# Patient Record
Sex: Female | Born: 1950
Health system: Southern US, Community
[De-identification: ages and names within clinical notes are randomized; demographics above are authoritative.]

## PROBLEM LIST (undated history)

## (undated) DIAGNOSIS — K5792 Diverticulitis of intestine, part unspecified, without perforation or abscess without bleeding: Secondary | ICD-10-CM

## (undated) DIAGNOSIS — E039 Hypothyroidism, unspecified: Secondary | ICD-10-CM

## (undated) DIAGNOSIS — F411 Generalized anxiety disorder: Secondary | ICD-10-CM

## (undated) DIAGNOSIS — I10 Essential (primary) hypertension: Secondary | ICD-10-CM

## (undated) DIAGNOSIS — M1990 Unspecified osteoarthritis, unspecified site: Secondary | ICD-10-CM

## (undated) DIAGNOSIS — Z9889 Other specified postprocedural states: Secondary | ICD-10-CM

## (undated) DIAGNOSIS — D509 Iron deficiency anemia, unspecified: Secondary | ICD-10-CM

## (undated) DIAGNOSIS — K5730 Diverticulosis of large intestine without perforation or abscess without bleeding: Secondary | ICD-10-CM

## (undated) DIAGNOSIS — E782 Mixed hyperlipidemia: Secondary | ICD-10-CM

## (undated) DIAGNOSIS — R091 Pleurisy: Secondary | ICD-10-CM

## (undated) DIAGNOSIS — K449 Diaphragmatic hernia without obstruction or gangrene: Secondary | ICD-10-CM

## (undated) DIAGNOSIS — R112 Nausea with vomiting, unspecified: Secondary | ICD-10-CM

## (undated) DIAGNOSIS — K219 Gastro-esophageal reflux disease without esophagitis: Secondary | ICD-10-CM

## (undated) HISTORY — DX: Gastro-esophageal reflux disease without esophagitis: K21.9

## (undated) HISTORY — DX: Diaphragmatic hernia without obstruction or gangrene: K44.9

## (undated) HISTORY — DX: Diverticulosis of large intestine without perforation or abscess without bleeding: K57.30

## (undated) HISTORY — PX: ABDOMINAL HYSTERECTOMY: SHX81

## (undated) HISTORY — DX: Unspecified osteoarthritis, unspecified site: M19.90

## (undated) HISTORY — DX: Generalized anxiety disorder: F41.1

## (undated) HISTORY — DX: Iron deficiency anemia, unspecified: D50.9

## (undated) HISTORY — DX: Mixed hyperlipidemia: E78.2

## (undated) HISTORY — PX: COLONOSCOPY: SHX174

## (undated) HISTORY — DX: Diverticulitis of intestine, part unspecified, without perforation or abscess without bleeding: K57.92

## (undated) HISTORY — DX: Hypothyroidism, unspecified: E03.9

## (undated) HISTORY — PX: TONSILLECTOMY: SUR1361

## (undated) HISTORY — DX: Essential (primary) hypertension: I10

## (undated) HISTORY — PX: APPENDECTOMY: SHX54

---

## 1986-12-07 HISTORY — PX: OTHER SURGICAL HISTORY: SHX169

## 1996-12-07 HISTORY — PX: ABDOMINAL HYSTERECTOMY: SHX81

## 2007-09-12 ENCOUNTER — Ambulatory Visit (HOSPITAL_COMMUNITY): Admission: RE | Admit: 2007-09-12 | Discharge: 2007-09-12 | Payer: Self-pay | Admitting: Pediatrics

## 2008-09-13 ENCOUNTER — Ambulatory Visit (HOSPITAL_COMMUNITY): Admission: RE | Admit: 2008-09-13 | Discharge: 2008-09-13 | Payer: Self-pay | Admitting: Pediatrics

## 2009-04-29 ENCOUNTER — Encounter (INDEPENDENT_AMBULATORY_CARE_PROVIDER_SITE_OTHER): Payer: Self-pay | Admitting: *Deleted

## 2009-05-28 ENCOUNTER — Ambulatory Visit (HOSPITAL_COMMUNITY): Admission: RE | Admit: 2009-05-28 | Discharge: 2009-05-28 | Payer: Self-pay | Admitting: General Surgery

## 2009-07-17 ENCOUNTER — Encounter (INDEPENDENT_AMBULATORY_CARE_PROVIDER_SITE_OTHER): Payer: Self-pay | Admitting: *Deleted

## 2009-09-18 ENCOUNTER — Ambulatory Visit (HOSPITAL_COMMUNITY): Admission: RE | Admit: 2009-09-18 | Discharge: 2009-09-18 | Payer: Self-pay | Admitting: Family Medicine

## 2009-12-26 ENCOUNTER — Emergency Department (HOSPITAL_COMMUNITY): Admission: EM | Admit: 2009-12-26 | Discharge: 2009-12-26 | Payer: Self-pay | Admitting: Emergency Medicine

## 2010-01-24 ENCOUNTER — Ambulatory Visit (HOSPITAL_COMMUNITY): Admission: RE | Admit: 2010-01-24 | Discharge: 2010-01-24 | Payer: Self-pay | Admitting: General Surgery

## 2010-06-13 ENCOUNTER — Ambulatory Visit (HOSPITAL_COMMUNITY): Admission: RE | Admit: 2010-06-13 | Discharge: 2010-06-13 | Payer: Self-pay | Admitting: Family Medicine

## 2010-06-25 ENCOUNTER — Ambulatory Visit (HOSPITAL_COMMUNITY): Admission: RE | Admit: 2010-06-25 | Discharge: 2010-06-25 | Payer: Self-pay | Admitting: Pediatrics

## 2010-06-25 ENCOUNTER — Ambulatory Visit: Payer: Self-pay | Admitting: Cardiology

## 2010-06-25 ENCOUNTER — Encounter (INDEPENDENT_AMBULATORY_CARE_PROVIDER_SITE_OTHER): Payer: Self-pay | Admitting: Pediatrics

## 2010-09-22 ENCOUNTER — Ambulatory Visit (HOSPITAL_COMMUNITY): Admission: RE | Admit: 2010-09-22 | Discharge: 2010-09-22 | Payer: Self-pay | Admitting: Pediatrics

## 2011-02-22 LAB — CBC
MCHC: 33.1 g/dL (ref 30.0–36.0)
MCV: 79.3 fL (ref 78.0–100.0)
Platelets: 280 10*3/uL (ref 150–400)

## 2011-02-22 LAB — BASIC METABOLIC PANEL
BUN: 16 mg/dL (ref 6–23)
CO2: 27 mEq/L (ref 19–32)
Chloride: 102 mEq/L (ref 96–112)
Creatinine, Ser: 0.95 mg/dL (ref 0.4–1.2)

## 2011-02-22 LAB — DIFFERENTIAL
Basophils Relative: 1 % (ref 0–1)
Eosinophils Absolute: 0.1 10*3/uL (ref 0.0–0.7)
Neutrophils Relative %: 54 % (ref 43–77)

## 2011-02-22 LAB — POCT CARDIAC MARKERS: Myoglobin, poc: 59.5 ng/mL (ref 12–200)

## 2011-03-27 ENCOUNTER — Other Ambulatory Visit (HOSPITAL_COMMUNITY): Payer: Self-pay | Admitting: Pediatrics

## 2011-03-27 ENCOUNTER — Ambulatory Visit (HOSPITAL_COMMUNITY)
Admission: RE | Admit: 2011-03-27 | Discharge: 2011-03-27 | Disposition: A | Discharge status: Home / Self Care | Payer: BC Managed Care – PPO | Source: Ambulatory Visit | Attending: Pediatrics | Admitting: Pediatrics

## 2011-03-27 DIAGNOSIS — W19XXXA Unspecified fall, initial encounter: Secondary | ICD-10-CM

## 2011-03-27 DIAGNOSIS — R0789 Other chest pain: Secondary | ICD-10-CM

## 2011-03-27 DIAGNOSIS — M25539 Pain in unspecified wrist: Secondary | ICD-10-CM | POA: Insufficient documentation

## 2011-04-21 NOTE — H&P (Signed)
NAMEGENNIE, Debra NO.:  [id number]   MEDICAL RECORD NO.:  [id number]          PATIENT TYPE:  AMB   LOCATION:  DAY                           FACILITY:  [facility]   PHYSICIAN:  Dalia Heading, M.D.  DATE OF BIRTH:  12/23/1950   DATE OF ADMISSION:  DATE OF DISCHARGE:  LH                              HISTORY & PHYSICAL   CHIEF COMPLAINT:  Hematochezia.   HISTORY OF PRESENT ILLNESS:  The patient is a 60 year old white female,  who was referred for endoscopic evaluation.  She needs a colonoscopy due  to hematochezia.  She recently noticed blood on her stool when she wipes  herself.  No abdominal pain, weight loss, nausea, vomiting, diarrhea,  constipation, or melena have been noted.  She denies any other abdominal  symptoms.  She last had a colonoscopy 3 years ago in [place] which was  reportedly negative.  There is no family history of colon carcinoma.   PAST MEDICAL HISTORY:  Includes hypertension, depression.   PAST SURGICAL HISTORY:  Hysterectomy.   CURRENT MEDICATIONS:  Hydrochlorothiazide, simvastatin, Effexor,  lorazepam, Amitiza, propranolol.   ALLERGIES:  SULFA, MORPHINE, CODEINE.   REVIEW OF SYSTEMS:  Noncontributory.   PHYSICAL EXAMINATION:  GENERAL:  The patient is a well-developed, well-  nourished, white female, in no acute distress.  LUNGS:  Clear to auscultation with equal breath sounds bilaterally.  HEART:  Regular rate and rhythm without S3, S4, murmurs.  ABDOMEN:  Soft, nontender, nondistended.  No hepatosplenomegaly or  masses noted.  RECTAL:  Deferred to the procedure.   IMPRESSION:  Hematochezia.   PLAN:  The patient is scheduled for colonoscopy on May 28, 2009.  The  risks and benefits of the procedure including bleeding and perforation  were fully explained to the patient, gave informed consent.      Dalia Heading, M.D.  Electronically Signed     MAJ/MEDQ  D:  05/02/2009  T:  05/03/2009  Job:  [id number]   cc:   Francoise Schaumann.  Milford Cage DO, FAAP  Fax: [phone]

## 2011-07-21 ENCOUNTER — Encounter (INDEPENDENT_AMBULATORY_CARE_PROVIDER_SITE_OTHER): Payer: Self-pay | Admitting: *Deleted

## 2011-07-21 ENCOUNTER — Encounter (INDEPENDENT_AMBULATORY_CARE_PROVIDER_SITE_OTHER): Payer: Self-pay

## 2011-08-27 ENCOUNTER — Encounter (INDEPENDENT_AMBULATORY_CARE_PROVIDER_SITE_OTHER): Payer: Self-pay | Admitting: Internal Medicine

## 2011-08-27 ENCOUNTER — Ambulatory Visit (INDEPENDENT_AMBULATORY_CARE_PROVIDER_SITE_OTHER): Payer: BC Managed Care – PPO | Admitting: Internal Medicine

## 2011-08-27 DIAGNOSIS — R1032 Left lower quadrant pain: Secondary | ICD-10-CM

## 2011-08-27 NOTE — Progress Notes (Signed)
Subjective:     Patient ID: Debra [name], female   DOB: [birth year], 60 y.o.   MRN: [id number]  HPI   Debra [name] is  A 60 yrs old female referred to our office by Dr. Webb Laws for diverticulitis.  Her last flare of diverticulitis was July 08, 2011.  She tells me she keeps Cipro on hand for her flare.  She will have left lower quadrant pain.  Her last colonoscopy was a year ago with Dr Lovell Sheehan which revealed Diverticulosis, otherwise normal..  She has had 2 flare this year.   No weight loss. Appetite is good. She has BM usually every day. No rectal bleeding or melena.  She has never undergone a CT abdomen/pelvis to document diverticulitis.   12/26/2010 CHEST - 2 VIEW  Comparison: None  Findings: The cardiac silhouette, mediastinal and hilar contours  are within normal limits. The lungs are clear of acute process.  There is a rounded density in the left midlung which is likely  granuloma. A follow-up chest x-ray in 4 months is suggested to  document stability. There is a moderate-to-large hiatal hernia.  The bony thorax is intact  IMPRESSION:  1. No acute cardiopulmonary findings.  2. Rounded nodule in the left midlung is likely a granuloma. A  follow-up chest x-ray in 4 months is suggested.  3. Moderate to large hiatal hernia.     Review of Systems  See hpi Current Outpatient Prescriptions  Medication Sig Dispense Refill  . aspirin 81 MG tablet Take 81 mg by mouth daily.        Marland Kitchen buPROPion (WELLBUTRIN SR) 150 MG 12 hr tablet Take 150 mg by mouth 2 (two) times daily. Patient takes 1-2 tablets a day      . fluticasone (FLOVENT HFA) 110 MCG/ACT inhaler Inhale 1 puff into the lungs as needed.       . frovatriptan (FROVA) 2.5 MG tablet Take 2.5 mg by mouth as needed. If recurs, may repeat after 2 hours. Max of 3 tabs in 24 hours.       Marland Kitchen levothyroxine (SYNTHROID, LEVOTHROID) 100 MCG tablet Take 100 mcg by mouth daily.        Marland Kitchen LORazepam (ATIVAN) 0.5 MG tablet Take 0.5 mg by mouth. Patient takes  1 tablet by mouth at bedtime      . lubiprostone (AMITIZA) 8 MCG capsule Take 8 mcg by mouth as needed.       . pravastatin (PRAVACHOL) 10 MG tablet Take 10 mg by mouth daily.        . Prenatal Vit-Fe Fumarate-FA (M-VIT PO) Take by mouth 1 day or 1 dose.        . propranolol (INDERAL) 80 MG tablet Take 80 mg by mouth once.        . ranitidine (ZANTAC) 300 MG capsule Take 300 mg by mouth every evening.         Past Surgical History  Procedure Date  . Colonoscopy 2011    Dr. Lovell Sheehan  . Cesarean section     First one 1978 , second on 1984  . Hysterctomy 1988   Past Medical History  Diagnosis Date  . Unspecified hypothyroidism   . Generalized anxiety disorder   . Mixed hyperlipidemia   . Esophageal reflux   . Diverticulosis of colon (without mention of hemorrhage)   . Dyspnea   . Hiatal hernia    82.1 mEq/dose History   Social History Narrative  . No narrative on file   Family Status  Relation Status Death Age  . Mother Alive   . Father Deceased 58    MI  . Brother Deceased 56    MI  . Son Alive   . Son Alive        Objective:   Physical Exam Filed Vitals:   08/27/11 1528  BP: 130/80  Pulse: 76  Temp: 98.7 F (37.1 C)  TempSrc: Oral  Resp: 12  Height: 5\' 5"  (1.651 m)  Weight: 181 lb (82.101 kg)   Alert and oriented. Skin warm and dry. Oral mucosa is moist. Natural teeth in good condition. Sclera anicteric, conjunctivae is pink. Thyroid not enlarged. No cervical lymphadenopathy. Lungs clear. Heart regular rate and rhythm.  Abdomen is soft. Bowel sounds are positive. No hepatomegaly. No abdominal masses felt. No tenderness.  No edema to lower extremities. Patient is alert and oriented.      Assessment:    Hx of diverticulitis. Left lower quadrant.    Plan:   Next flare she will call and we will get a CT abdomen/pelvis with CM.  If she does have diverticulitis, she will be referred to a surgeon.  Fiber and residue restricted diet if she has a flare.

## 2011-09-08 ENCOUNTER — Other Ambulatory Visit (HOSPITAL_COMMUNITY): Payer: Self-pay | Admitting: Pediatrics

## 2011-09-08 DIAGNOSIS — Z139 Encounter for screening, unspecified: Secondary | ICD-10-CM

## 2011-09-24 ENCOUNTER — Ambulatory Visit (HOSPITAL_COMMUNITY)
Admission: RE | Admit: 2011-09-24 | Discharge: 2011-09-24 | Disposition: A | Discharge status: Home / Self Care | Payer: BC Managed Care – PPO | Source: Ambulatory Visit | Attending: Pediatrics | Admitting: Pediatrics

## 2011-09-24 DIAGNOSIS — Z1231 Encounter for screening mammogram for malignant neoplasm of breast: Secondary | ICD-10-CM | POA: Insufficient documentation

## 2011-09-24 DIAGNOSIS — Z139 Encounter for screening, unspecified: Secondary | ICD-10-CM

## 2011-10-08 ENCOUNTER — Other Ambulatory Visit (INDEPENDENT_AMBULATORY_CARE_PROVIDER_SITE_OTHER): Payer: Self-pay | Admitting: Internal Medicine

## 2011-10-08 ENCOUNTER — Telehealth (INDEPENDENT_AMBULATORY_CARE_PROVIDER_SITE_OTHER): Payer: Self-pay | Admitting: Internal Medicine

## 2011-10-08 DIAGNOSIS — K5792 Diverticulitis of intestine, part unspecified, without perforation or abscess without bleeding: Secondary | ICD-10-CM

## 2011-10-08 NOTE — Telephone Encounter (Signed)
Patient came to office today with c/o severe left lower abdominal pain.  I advised her since her pain was so severe, she really needed to go to the emergency room.  They would be able to get a CT abdomen/pelvis without a prior authorization. She refused to go to the ED. I even told her I would call the physician on duty to let him know she was on her way. She seemed to be pain while in the office. We will try to get an authorization for the CT on her today.   She was advised last visit that if she had another flare of diverticulitis she would be referred to a surgeon for evaluation.

## 2011-10-09 ENCOUNTER — Ambulatory Visit (HOSPITAL_COMMUNITY)
Admission: RE | Admit: 2011-10-09 | Discharge: 2011-10-09 | Disposition: A | Discharge status: Home / Self Care | Payer: BC Managed Care – PPO | Source: Ambulatory Visit | Attending: Internal Medicine | Admitting: Internal Medicine

## 2011-10-09 DIAGNOSIS — R188 Other ascites: Secondary | ICD-10-CM | POA: Insufficient documentation

## 2011-10-09 DIAGNOSIS — R933 Abnormal findings on diagnostic imaging of other parts of digestive tract: Secondary | ICD-10-CM | POA: Insufficient documentation

## 2011-10-09 DIAGNOSIS — K5730 Diverticulosis of large intestine without perforation or abscess without bleeding: Secondary | ICD-10-CM | POA: Insufficient documentation

## 2011-10-09 DIAGNOSIS — K5792 Diverticulitis of intestine, part unspecified, without perforation or abscess without bleeding: Secondary | ICD-10-CM

## 2011-10-09 DIAGNOSIS — R1032 Left lower quadrant pain: Secondary | ICD-10-CM | POA: Insufficient documentation

## 2011-10-09 DIAGNOSIS — R918 Other nonspecific abnormal finding of lung field: Secondary | ICD-10-CM | POA: Insufficient documentation

## 2011-10-09 MED ORDER — IOHEXOL 300 MG/ML  SOLN
100.0000 mL | Freq: Once | INTRAMUSCULAR | Status: AC | PRN
  Administered 2011-10-09: 100 mL via INTRAVENOUS

## 2011-10-12 ENCOUNTER — Telehealth (INDEPENDENT_AMBULATORY_CARE_PROVIDER_SITE_OTHER): Payer: Self-pay | Admitting: *Deleted

## 2011-10-12 DIAGNOSIS — K5792 Diverticulitis of intestine, part unspecified, without perforation or abscess without bleeding: Secondary | ICD-10-CM

## 2011-10-12 NOTE — Telephone Encounter (Signed)
Patient presented to office to apologize for her behavior on Thursday and for not staying at hospital for results of CT on Friday, wants you to call her about CT results @ [phone]

## 2011-10-13 ENCOUNTER — Telehealth (INDEPENDENT_AMBULATORY_CARE_PROVIDER_SITE_OTHER): Payer: Self-pay | Admitting: Internal Medicine

## 2011-10-13 MED ORDER — METRONIDAZOLE 500 MG PO TABS: 500.0000 mg | ORAL_TABLET | Freq: Three times a day (TID) | ORAL | Status: AC

## 2011-10-13 MED ORDER — CIPROFLOXACIN HCL 500 MG PO TABS: 500.0000 mg | ORAL_TABLET | Freq: Two times a day (BID) | ORAL | Status: AC

## 2011-10-13 NOTE — Telephone Encounter (Signed)
Results given to patient

## 2011-10-13 NOTE — Telephone Encounter (Signed)
appt sch'd 11/28/ @ 345, lmom advising patient of appt

## 2011-10-13 NOTE — Telephone Encounter (Signed)
Results given to patient. She is still hurting some. She will have an office visit in 2 weeks. cipro and flagyl will be callled in to drugs store.   Ann, OV in 2-3 weeks with me to discuss referral to a surgeon

## 2011-10-26 ENCOUNTER — Telehealth (INDEPENDENT_AMBULATORY_CARE_PROVIDER_SITE_OTHER): Payer: Self-pay | Admitting: Internal Medicine

## 2011-10-26 NOTE — Telephone Encounter (Signed)
Was told by Terri to call back with a surgeon's name. Dr. Lovell Sheehan is the only surgeon she could find. Please send a copy of CT to his office. Dr. Lovell Sheehan phone number is [phone] and the patient's return phone number is [phone].

## 2011-10-27 NOTE — Telephone Encounter (Signed)
Message left on her voice mail.

## 2011-11-04 ENCOUNTER — Ambulatory Visit (INDEPENDENT_AMBULATORY_CARE_PROVIDER_SITE_OTHER): Payer: BC Managed Care – PPO | Admitting: Internal Medicine

## 2011-12-22 NOTE — H&P (Signed)
  NTS SOAP Note  Vital Signs:  Vitals as of: 12/22/2011: Systolic 140: Diastolic 68: Heart Rate 67: Temp 98.55F: Height 63ft 5in: Weight 188Lbs 0 Ounces: Pain Level 9: BMI 31  BMI : 31.28 kg/m2  Subjective: This 56 Years 59 Months old Female presents for of GERD and worsening SOB mos likely due to recurrent aspiration from uncontrolled reflux disease.  Patient has a known hiatal hernia and would like to get it fixed.  Seen on EGD in 2011.  PPI and spiriva not controlling her symptoms.  Review of Symptoms:  Constitutional:unremarkable Head:unremarkable Eyes:unremarkable Nose/Mouth/Throat:unremarkable Cardiovascular:unremarkable Respiratory:dyspnea Gastrointestinheartburn Genitourinary:unremarkable Musculoskeletal:unremarkable Skin:unremarkable Hematolgic/Lymphatic:unremarkable Allergic/Immunologic:unremarkable   Past Medical History:Reviewed   Past Medical History  Surgical History:  Hysterectomy Medical Problems:  High Blood pressure, High cholesterol, Hypothyroidism, hiatal hernia Allergies: sulfa, morphine Medications: lorazepam, bupropion, levothyroxine, propranolol, pravastatin, spiriva   Social History:Reviewed   Social History  Preferred Language: English ([place]) Race:  White Ethnicity: Not Hispanic / Latino Age: 47 Years 2 Months Marital Status:  M Alcohol:  No Recreational drug(s):  No   Smoking Status: Never smoker reviewed on 11/03/2011  Family History:Reviewed   Family History  Is there a family history of:No family h/o colon cancer    Objective Information: General:Well appearing, well nourished in no distress. Head:Atraumatic; no masses; no abnormalities Throat:no erythema, exudates or lesions. Neck:Supple without lymphadenopathy.  Heart:RRR, no murmur or gallop.  Normal S1, S2.  No S3, S4.  Lungs:CTA bilaterally, no wheezes, rhonchi, rales.  Breathing  unlabored. Abdomen:Soft, NT/ND, no HSM, no masses.  Assessment:Hiatal hernia  Diagnosis &amp; Procedure: DiagnosisCode: 553.3, ProcedureCode: 16109,    Plan:Scheduled for laparoscopic nissen fundoplication on 01/20/12.  Literature given.   Patient Education:Alternative treatments to surgery were discussed with patient (and family).Risks and benefits  of procedure were fully explained to the patient (and family) who gave informed consent. Patient/family questions were addressed.  Follow-up:Pending Surgery

## 2012-01-14 ENCOUNTER — Encounter (HOSPITAL_COMMUNITY): Payer: Self-pay

## 2012-01-14 ENCOUNTER — Encounter (HOSPITAL_COMMUNITY)
Admission: RE | Admit: 2012-01-14 | Discharge: 2012-01-14 | Disposition: A | Discharge status: Home / Self Care | Payer: BC Managed Care – PPO | Source: Ambulatory Visit | Attending: General Surgery | Admitting: General Surgery

## 2012-01-14 ENCOUNTER — Encounter (HOSPITAL_COMMUNITY): Payer: Self-pay | Admitting: Pharmacy Technician

## 2012-01-14 ENCOUNTER — Other Ambulatory Visit: Payer: Self-pay

## 2012-01-14 DIAGNOSIS — Z01810 Encounter for preprocedural cardiovascular examination: Secondary | ICD-10-CM | POA: Insufficient documentation

## 2012-01-14 DIAGNOSIS — K219 Gastro-esophageal reflux disease without esophagitis: Secondary | ICD-10-CM | POA: Insufficient documentation

## 2012-01-14 DIAGNOSIS — Z01812 Encounter for preprocedural laboratory examination: Secondary | ICD-10-CM | POA: Insufficient documentation

## 2012-01-14 HISTORY — DX: Nausea with vomiting, unspecified: Z98.890

## 2012-01-14 HISTORY — DX: Nausea with vomiting, unspecified: R11.2

## 2012-01-14 LAB — DIFFERENTIAL
Eosinophils Relative: 2 % (ref 0–5)
Lymphocytes Relative: 35 % (ref 12–46)
Monocytes Absolute: 0.3 10*3/uL (ref 0.1–1.0)
Monocytes Relative: 7 % (ref 3–12)
Neutrophils Relative %: 55 % (ref 43–77)

## 2012-01-14 LAB — CBC
HCT: 21.6 % — ABNORMAL LOW (ref 36.0–46.0)
Hemoglobin: 5.8 g/dL — CL (ref 12.0–15.0)
MCH: 16.7 pg — ABNORMAL LOW (ref 26.0–34.0)
MCHC: 26.9 g/dL — ABNORMAL LOW (ref 30.0–36.0)
MCV: 62.1 fL — ABNORMAL LOW (ref 78.0–100.0)
Platelets: 339 10*3/uL (ref 150–400)
RBC: 3.48 MIL/uL — ABNORMAL LOW (ref 3.87–5.11)
RDW: 18.6 % — ABNORMAL HIGH (ref 11.5–15.5)
WBC: 4.6 10*3/uL (ref 4.0–10.5)

## 2012-01-14 LAB — BASIC METABOLIC PANEL WITH GFR
BUN: 21 mg/dL (ref 6–23)
CO2: 22 meq/L (ref 19–32)
Calcium: 9.5 mg/dL (ref 8.4–10.5)
Chloride: 106 meq/L (ref 96–112)
Creatinine, Ser: 0.96 mg/dL (ref 0.50–1.10)
GFR calc Af Amer: 72 mL/min — ABNORMAL LOW
GFR calc non Af Amer: 63 mL/min — ABNORMAL LOW
Glucose, Bld: 123 mg/dL — ABNORMAL HIGH (ref 70–99)
Potassium: 4.3 meq/L (ref 3.5–5.1)
Sodium: 139 meq/L (ref 135–145)

## 2012-01-14 LAB — SURGICAL PCR SCREEN
MRSA, PCR: NEGATIVE
Staphylococcus aureus: POSITIVE — AB

## 2012-01-14 NOTE — Patient Instructions (Addendum)
19 Debra [name]  01/14/2012   Your procedure is scheduled on:  01/20/12  Report to [facility] at 06:15 AM.  Call this number if you have problems the morning of surgery: [phone]   Remember:   Do not eat food:After Midnight.  May have clear liquids:until Midnight .  Clear liquids include soda, tea, black coffee, apple or grape juice, broth.  Take these medicines the morning of surgery with A SIP OF WATER: Wellbutrin, Synthroid, Inderal, Zantac and Ativan only if needed. Also, take your inhaler, Advair, and bring it with you to the hospital.   Do not wear jewelry, make-up or nail polish.  Do not wear lotions, powders, or perfumes. You may wear deodorant.  Do not shave 48 hours prior to surgery.  Do not bring valuables to the hospital.  Contacts, dentures or bridgework may not be worn into surgery.  Leave suitcase in the car. After surgery it may be brought to your room.  For patients admitted to the hospital, checkout time is 11:00 AM the day of discharge.   Patients discharged the day of surgery will not be allowed to drive home.  Name and phone number of your driver:   Special Instructions: CHG Shower Use Special Wash: 1/2 bottle night before surgery and 1/2 bottle morning of surgery.   Please read over the following fact sheets that you were given: Pain Booklet, MRSA Information, Surgical Site Infection Prevention, Anesthesia Post-op Instructions and Care and Recovery After Surgery    Nissen Fundoplication Care After Please read the instructions outlined below and refer to this sheet for the next few weeks. These discharge instructions provide you with general information on caring for yourself after you leave the hospital. Your doctor may also give you specific instructions. While your treatment has been planned according to the most current medical practices available, unavoidable complications sometimes happen. If you have any problems or questions after discharge, please call  your doctor. ACTIVITY  Take frequent rest periods throughout the day.   Take frequent walks throughout the day. This will help to prevent blood clots.   Continue to do your coughing and deep breathing exercises once you get home. This will help to prevent pneumonia.   No strenuous activities such as heavy lifting, pushing or pulling until after your follow-up visit with your doctor. Do not lift anything heavier than 10 pounds.   Talk with your caregiver about when you may return to work and your exercise routine.   You may shower 2 days after surgery. Pat incisions dry. Do not rub incisions with washcloth or towel.   Do not drive while taking prescription pain medication.  NUTRITION  Continue with a liquid diet, or the diet you were directed to take, until your first follow-up visit with your surgeon.   Drink fluids (6-8 glasses a day).   Call your caregiver for persistent nausea (feeling sick to your stomach), vomiting, bloating or difficulty swallowing.  ELIMINATION It is very important not to strain during bowel movements. If constipation should occur, you may:  Take a mild laxative (such as Milk of Magnesia).   Add fruit and bran to your diet.   Drink more fluids.   Call your caregiver if constipation is not relieved.  FEVER If you feel feverish or have shaking chills, take your temperature. If it is 102 F (38.9 C) or above, call your caregiver. The fever may mean there is an infection. PAIN CONTROL  If a prescription was given for a  pain reliever, please follow your caregiver's directions.   Only take over-the-counter or prescription medicines for pain, discomfort, or fever as directed by your caregiver.   If the pain is not relieved by your medicine, becomes worse, or you have difficulty breathing, call your doctor.  INCISION  It is normal for your cuts (incisions) from surgery to have a small amount of drainage for the first 1-2 days. Once the drainage has  stopped, leave your incision(s) open to air.   Check your incision(s) and surrounding area daily for any redness, swelling, increased drainage or bleeding. If any of these are present or if the wound edges start to separate, call your doctor.   If you have small adhesive strips in place, they will peel and fall off. (If these strips are covered with a clear bandage, your doctor will tell you when to remove them.)   If you have staples, your caregiver will remove them at the follow-up appointment.  Document Released: 07/16/2004 Document Revised: 08/05/2011 Document Reviewed: 10/20/2007 [facility] Patient Information 2012 [place], [place].

## 2012-01-14 NOTE — Pre-Procedure Instructions (Signed)
Hgb 5.8 and  Hct-21.6,called to Dr Lovell Sheehan. He will call patient at home.

## 2012-01-19 ENCOUNTER — Encounter (HOSPITAL_COMMUNITY): Payer: Self-pay | Admitting: Oncology

## 2012-01-19 ENCOUNTER — Encounter (HOSPITAL_COMMUNITY): Payer: BC Managed Care – PPO | Attending: Oncology | Admitting: Oncology

## 2012-01-19 VITALS — BP 138/72 | HR 69 | Temp 98.2°F | Ht 62.5 in | Wt 186.5 lb

## 2012-01-19 DIAGNOSIS — R195 Other fecal abnormalities: Secondary | ICD-10-CM

## 2012-01-19 DIAGNOSIS — F329 Major depressive disorder, single episode, unspecified: Secondary | ICD-10-CM

## 2012-01-19 DIAGNOSIS — D509 Iron deficiency anemia, unspecified: Secondary | ICD-10-CM | POA: Insufficient documentation

## 2012-01-19 DIAGNOSIS — R0602 Shortness of breath: Secondary | ICD-10-CM

## 2012-01-19 HISTORY — DX: Iron deficiency anemia, unspecified: D50.9

## 2012-01-19 NOTE — Progress Notes (Signed)
[facility] NEW PATIENT EVALUATION   Name: Debra [name] Date: 01/19/2012 MRN: [id number] DOB: 1951-09-07    CC: Debra Ewing, MD, MD     DIAGNOSIS: Anemia   HISTORY OF PRESENT ILLNESS:Debra [name] is a 61 y.o. female with a significant medical history for HTN, Hypercholesterolemia, Depression, reactive, insomnia, and hypothyroidism who is referred to the [facility] for severe anemia with a Hgb of 5.5 g/dL on 06/14/2955.  The patient reports that she was seen by Dr. Lovell Sheehan at the [facility] of January 2013 for pre-operative visit and to have lab work performed on 01/14/12 before her hiatal hernia repair.  The patient underwent this lab work which illustrated a severely low Hgb of 5.8 g/dL.  As a result, the patient's surgery was cancelled and she was urged to follow-up with her PCP.  She was seen by Melony Overly PA-C at Dr. Webb Laws office.  Her hemoglobin was noted to be 5.5 g/dL at that time.  Other lab work that day revealed a MCV of 61.1 with a normal WBC and platelet count, serum iron 14, normal Vit B12, normal Folate, but a low ferritin at 1 ng/mL.  The patient refused a blood transfusion due to the risk of HIV.   She was then referred to the clinic for further evaluation.  The patient admits to a history of anemia, but she reports that she remembers her hemoglobin being 8 or 9.  The patient reports SOB, headaches that are not new and are at her baseline, and black tarry stool secondary to ferrous iron PO.  She denies any pica, ice cravings,  dizziness, double vision, fevers, chills, night sweats, nausea, vomiting, diarrhea, constipation, abdominal pain, chest pain, heart palpitations, blood in stool, rectal bleeding, urinary pain, burning, hematuria, vaginal bleeding, pain, spontaneous bleeding, easy bruisability.   FAMILY HISTORY: family history includes Healthy in her sons; Heart disease in her mother; and Rheum arthritis in her mother.  There is no history of  Anesthesia problems, and Hypotension, and Malignant hyperthermia, and Pseudochol deficiency, .  The patient's mother is alive at the age of 78.  She has rheumatoid arthritis and a history of anemia.  Her father is estranged from the family.  The patient has one brother whom is deceased at the age of 13 from MI.  She has two sons (30 and 65 years old).  They are healthy.   PAST MEDICAL HISTORY:  has a past medical history of Unspecified hypothyroidism; Generalized anxiety disorder; Mixed hyperlipidemia; Esophageal reflux; Diverticulosis of colon (without mention of hemorrhage); Dyspnea; Hiatal hernia; and PONV (postoperative nausea and vomiting).       CURRENT MEDICATIONS: Ms. Erker does not currently have medications on file.   SOCIAL HISTORY:  reports that she has never smoked. She has never used smokeless tobacco. She reports that she does not drink alcohol or use illicit drugs.  The patient was born [place]  She has her Master's degree and 18 hours towards her Doctorate.  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  He was previously married for 32 years and divorced this man due infidelity.  She is married to her College sweat heart now and they have been married for 6 years.  She lives with her husband at home.  She denies any EtOH, Tobacco, and illicit drug abuse.    ALLERGIES: Codeine; Morphine and related; and Sulfa antibiotics  She vomits with Codeine and morphine.  She has a  rash with sulfonamides.    LABORATORY DATA:  CBC    Component Value Date/Time   WBC 4.6 01/14/2012 1500   RBC 3.48* 01/14/2012 1500   HGB 5.8* 01/14/2012 1500   HCT 21.6* 01/14/2012 1500   PLT 339 01/14/2012 1500   MCV 62.1* 01/14/2012 1500   MCH 16.7* 01/14/2012 1500   MCHC 26.9* 01/14/2012 1500   RDW 18.6* 01/14/2012 1500   LYMPHSABS 1.6 01/14/2012 1500   MONOABS 0.3 01/14/2012 1500   EOSABS 0.1 01/14/2012 1500   BASOSABS 0.0 01/14/2012 1500      Chemistry      Component Value Date/Time    NA 139 01/14/2012 1500   K 4.3 01/14/2012 1500   CL 106 01/14/2012 1500   CO2 22 01/14/2012 1500   BUN 21 01/14/2012 1500   CREATININE 0.96 01/14/2012 1500      Component Value Date/Time   CALCIUM 9.5 01/14/2012 1500       Chemistry      Component Value Date/Time   NA 139 01/14/2012 1500   K 4.3 01/14/2012 1500   CL 106 01/14/2012 1500   CO2 22 01/14/2012 1500   BUN 21 01/14/2012 1500   CREATININE 0.96 01/14/2012 1500      Component Value Date/Time   CALCIUM 9.5 01/14/2012 1500     01/15/2012: Her hemoglobin was noted to be 5.5 g/dL at that time.  Other lab work that day revelaed a MCV of 61.1 with a normal WBC and platelet count, serum iron 14, normal Vit B12, normal Folate, but a low ferritin at 1 ng/mL.     REVIEW OF SYSTEMS: Patient reports no health concerns.   PHYSICAL EXAM:  height is 5' 2.5" (1.588 m) and weight is 186 lb 8 oz (84.596 kg). Her oral temperature is 98.2 F (36.8 C). Her blood pressure is 138/72 and her pulse is 69.  General appearance: alert, cooperative, appears stated age, no distress and mildly obese Head: Normocephalic, without obvious abnormality, atraumatic.  No tongue soreness. Neck: no adenopathy, supple, symmetrical, trachea midline and thyroid not enlarged, symmetric, no tenderness/mass/nodules Lymph nodes: Cervical, supraclavicular, and axillary nodes normal. Resp: clear to auscultation bilaterally and normal percussion bilaterally Back: symmetric, no curvature. ROM normal. No CVA tenderness. Cardio: regular rate and rhythm, S1, S2 normal, no murmur, click, rub or gallop GI: soft, non-tender; bowel sounds normal; no masses,  no organomegaly Extremities: extremities normal, atraumatic, no cyanosis or edema.  No nail changes noted.  Neurologic: Grossly normal     IMPRESSION:  1. Iron deficiency anemia with Hgb 5.5 and Ferritin of 1 on 01/15/2012 2. SOB 3. HTN 4. Hypercholesterolemia 5. Depression, reactive to incident with son, take Wellbutrin low-dose 6. Insomnia,  takes Ativan 0.5 mg at HS PRN 7. Hypothyroidism, on synthroid 8. Headaches, at baseline.   PLAN:  1. Ferrous sulfate to 2 tablets daily  2. Folic acid 0.4 mg daily OTC 3. Refer to Dr. Karilyn Cota for consideration for camera capsule studies.  4. Lab work in 4 weeks: CBC, Ferritin, Iron/TIBC 4. Return in 4 weeks for follow-up.  All questions were answered.  The patient knows to call the clinic with any questions or concerns. Patient seen and examined by Dr. Glenford Peers.  Anthonny Schiller

## 2012-01-19 NOTE — Patient Instructions (Signed)
[facility]  Discharge Instructions  RECOMMENDATIONS MADE BY THE CONSULTANT AND ANY TEST RESULTS WILL BE SENT TO YOUR REFERRING DOCTOR.   Dr.Neijstrom will talk with Dr.Rehman and we will get you an appointment with his office. Either our office or Dr.Rehman's office will call you with their appointment. We will see you back here in our clinic in 1 month for lab work and then to see the doctor again. Feel free to call our office with any issues or concerns that you may have.   I acknowledge that I have been informed and understand all the instructions given to me and received a copy. I do not have any more questions at this time, but understand that I may call the [facility] at [facility] at [phone] during business hours should I have any further questions or need assistance in obtaining follow-up care.    __________________________________________  _____________  __________ Signature of Patient or Authorized Representative            Date                   Time    __________________________________________ Nurse's Signature

## 2012-01-20 ENCOUNTER — Inpatient Hospital Stay (HOSPITAL_COMMUNITY): Admission: RE | Admit: 2012-01-20 | Payer: BC Managed Care – PPO | Source: Ambulatory Visit | Admitting: General Surgery

## 2012-01-20 ENCOUNTER — Encounter (HOSPITAL_COMMUNITY): Admission: RE | Payer: Self-pay | Source: Ambulatory Visit

## 2012-01-20 SURGERY — FUNDOPLICATION, NISSEN, LAPAROSCOPIC
Anesthesia: General

## 2012-01-25 ENCOUNTER — Encounter (INDEPENDENT_AMBULATORY_CARE_PROVIDER_SITE_OTHER): Payer: Self-pay | Admitting: Internal Medicine

## 2012-01-25 ENCOUNTER — Ambulatory Visit (INDEPENDENT_AMBULATORY_CARE_PROVIDER_SITE_OTHER): Payer: BC Managed Care – PPO | Admitting: Internal Medicine

## 2012-01-25 VITALS — BP 130/76 | HR 72 | Temp 98.6°F | Ht 65.0 in | Wt 186.0 lb

## 2012-01-25 DIAGNOSIS — I10 Essential (primary) hypertension: Secondary | ICD-10-CM | POA: Insufficient documentation

## 2012-01-25 DIAGNOSIS — D649 Anemia, unspecified: Secondary | ICD-10-CM

## 2012-01-25 DIAGNOSIS — E785 Hyperlipidemia, unspecified: Secondary | ICD-10-CM | POA: Insufficient documentation

## 2012-01-25 NOTE — Patient Instructions (Addendum)
She will call once she see Dr. Mariel Sleet.  We will then proceed with a colonoscopy

## 2012-01-25 NOTE — Progress Notes (Signed)
Subjective:     Patient ID: Debra [name], female   DOB: 1951-06-03, 61 y.o.   MRN: [id number]  HPI Saw Dr. Lovell Sheehan the end of January.  She was to have surgery for a hiatal hernia, but she was found to be anemic. H and H 5.8 ans 21.6.  Last colonoscopy was 2 yrs ago which was normal except for mild diverticulosis. EGD also was normal except for a hiatal hernia.   She was last seen in our office in November for diverticulitis. She was place on Cipro.  She underwent a CT abdomen and pelvis for left lower quadrant pain. See below.  She says she has not had any further left lower abdominal pain. She is following a diverticular diet.  Appetite is better. No weight loss. She usually has a BM about 2 x 3 times a day. Formed. No melena or bright red rectal bleeding. She was guaiac positive at Dr. Webb Laws office. She tells me she has a hx of anemia, but her hemoglobin has never been this low. 10/2011 Ct abdomen anIMPRESSION:  1. The dominant finding is marked wall thickening in the 7 cm  segment of sigmoid colon with extensive surrounding stranding, a  small amount of ascites, and extensive sigmoid colon  diverticulosis. The appearance is most compatible with  diverticulitis. After resolution of the patient's acute condition,  I do recommend colon screening if not recently performed in order  to exclude the possibility of underlying sigmoid colon cancer.  2. Lower lumbar spondylosis and degenerative disc disease.  3. We partially visualize a subpleural nodule in the right lower  lobe. If the patient is at high risk for bronchogenic carcinoma,  follow-up chest CT at 6-12 months is recommended. If the patient  is at low risk for bronchogenic carcinoma, follow-up chest CT at 12  months is recommended. This recommendation follows the consensus  statement: Guidelines for Management of Small Pulmonary Nodules  Detected on CT Scans: A Statement from the Fleischner Society as  published in Radiology 2005;  237:395-400. Online at:  [email or web address].cz.  d pelvis with CM:   Review of Systems see hpi Current Outpatient Prescriptions  Medication Sig Dispense Refill  . buPROPion (WELLBUTRIN SR) 150 MG 12 hr tablet Take 150 mg by mouth daily. Can take up to 2xdaily      . ferrous sulfate 325 (65 FE) MG tablet Take 325 mg by mouth 2 (two) times daily before a meal.       . folic acid (FOLVITE) 400 MCG tablet Take 400 mcg by mouth daily.      Marland Kitchen levothyroxine (SYNTHROID, LEVOTHROID) 100 MCG tablet Take 100 mcg by mouth daily.        Marland Kitchen LORazepam (ATIVAN) 0.5 MG tablet Take 0.5 mg by mouth. Patient may take up to 2 tabs at bedtime      . Multiple Vitamin (MULITIVITAMIN WITH MINERALS) TABS Take 1 tablet by mouth daily.      . pravastatin (PRAVACHOL) 10 MG tablet Take 10 mg by mouth daily.        . propranolol (INDERAL) 80 MG tablet Take 80 mg by mouth daily.       . ranitidine (ZANTAC) 150 MG tablet Take 150 mg by mouth at bedtime.      . Fluticasone-Salmeterol (ADVAIR) 100-50 MCG/DOSE AEPB Inhale 1 puff into the lungs every 12 (twelve) hours.      . vitamin B-12 (CYANOCOBALAMIN) 250 MCG tablet Take 250 mcg by mouth daily.  Past Medical History  Diagnosis Date  . Unspecified hypothyroidism   . Generalized anxiety disorder   . Mixed hyperlipidemia   . Esophageal reflux   . Diverticulosis of colon (without mention of hemorrhage)   . Dyspnea   . Hiatal hernia   . PONV (postoperative nausea and vomiting)   . Iron deficiency anemia 01/19/2012  . Diverticulitis    Past Surgical History  Procedure Date  . Colonoscopy 2011-2008    Dr. Lovell Sheehan  . Cesarean section     First one 1978 , second on 1984  . Hysterctomy 1988  . Abdominal hysterectomy   . Tonsillectomy   . Appendectomy    Family Status  Relation Status Death Age  . Mother Alive   . Son Alive   . Son Alive   . Father Deceased 31    MI  . Brother Deceased 62    MI   History   Social  History  . Marital Status: Married    Spouse Name: N/A    Number of Children: N/A  . Years of Education: N/A   Occupational History  . Not on file.   Social History Main Topics  . Smoking status: Never Smoker   . Smokeless tobacco: Never Used  . Alcohol Use: No  . Drug Use: No  . Sexually Active: Yes    Birth Control/ Protection: Surgical   Other Topics Concern  . Not on file   Social History Narrative  . No narrative on file   Family Status  Relation Status Death Age  . Mother Alive   . Son Alive   . Son Alive   . Father Deceased 27    MI  . Brother Deceased 45    MI   Allergies  Allergen Reactions  . Codeine     Nausea and vomiting  . Morphine And Related Nausea And Vomiting  . Sulfa Antibiotics Hives       Objective:   Physical Exam Filed Vitals:   01/25/12 1449  Height: 5\' 5"  (1.651 m)  Weight: 186 lb (84.369 kg)    Alert and oriented. Skin warm and dry. Oral mucosa is moist.   . Sclera anicteric, conjunctivae is pink. Thyroid not enlarged. No cervical lymphadenopathy. Lungs clear. Heart regular rate and rhythm.  Abdomen is soft. Bowel sounds are positive. No hepatomegaly. No abdominal masses felt. No tenderness. Stool brown and guaiac negative.  No edema to lower extremities. Patient is alert and oriented.      Assessment:    Anemia, GI blood loss needs to be ruled out. Colonic neoplasm needs to be ruled out. (Ulcer, AVM also are in the differential.). (I discussed this Dr. Karilyn Cota).    Plan:     She is to see Dr. Mariel Sleet next month. Afterwards., we will schedule a colonoscopy

## 2012-01-26 ENCOUNTER — Ambulatory Visit (INDEPENDENT_AMBULATORY_CARE_PROVIDER_SITE_OTHER): Payer: BC Managed Care – PPO | Admitting: Internal Medicine

## 2012-02-15 ENCOUNTER — Encounter (HOSPITAL_COMMUNITY): Payer: BC Managed Care – PPO | Attending: Oncology

## 2012-02-15 ENCOUNTER — Other Ambulatory Visit (HOSPITAL_COMMUNITY): Payer: BC Managed Care – PPO

## 2012-02-15 DIAGNOSIS — D509 Iron deficiency anemia, unspecified: Secondary | ICD-10-CM

## 2012-02-15 LAB — CBC
Hemoglobin: 10.6 g/dL — ABNORMAL LOW (ref 12.0–15.0)
MCHC: 29.4 g/dL — ABNORMAL LOW (ref 30.0–36.0)
Platelets: 325 10*3/uL (ref 150–400)
WBC: 4.8 10*3/uL (ref 4.0–10.5)

## 2012-02-15 LAB — IRON AND TIBC
Iron: 36 ug/dL — ABNORMAL LOW (ref 42–135)
TIBC: 412 ug/dL (ref 250–470)

## 2012-02-15 NOTE — Progress Notes (Signed)
Labs drawn today for cbc,ferr,iron and Ibc

## 2012-02-16 ENCOUNTER — Encounter (HOSPITAL_BASED_OUTPATIENT_CLINIC_OR_DEPARTMENT_OTHER): Payer: BC Managed Care – PPO | Admitting: Oncology

## 2012-02-16 ENCOUNTER — Ambulatory Visit (HOSPITAL_COMMUNITY): Payer: BC Managed Care – PPO | Admitting: Oncology

## 2012-02-16 VITALS — BP 135/82 | HR 61 | Temp 97.9°F | Wt 184.1 lb

## 2012-02-16 DIAGNOSIS — D509 Iron deficiency anemia, unspecified: Secondary | ICD-10-CM

## 2012-02-16 NOTE — Progress Notes (Signed)
Debra Ewing, MD, MD [address] [place] [address]  1. Iron deficiency anemia  CBC, Ferritin, CBC, Differential, Ferritin, Iron and TIBC    CURRENT THERAPY: On Ferrous sulfate 325 mg BID, Folic Acid 400 mcg daily, and Vitamin C 1000 units daily.   INTERVAL HISTORY: Debra [name] 61 y.o. female returns for  regular  visit for followup of iron deficiency anemia.   I personally reviewed and went over laboratory results with the patient.  Her ferritin has risen from 1 to 15 in 4 weeks time with a nearly 5 gram increase in her hemoglobin as well in 4 weeks.  She presented with a hgb of 5.8 and is now 10.6 g/dL.    The patient reports that she feels wonderful.  She explains that her energy level has increased significantly.  She is doing much better and is much less tired.    She saw Dorene Ar (GI) in consultation for consideration of camera capsule study versus colonoscopy.  She will be scheduled for a colonoscopy in the near future.  She was advised to hold her iron pills 10 days before the procedure.  She understands this and was worried about this break in therapy.  I explained to her that with her ferritin rising, she is gaining some iron storage and the 10 day break will not affect her iron levels.   ROS: No TIA's or unusual headaches, no dysphagia.  No prolonged cough. No dyspnea or chest pain on exertion.  No abdominal pain, change in bowel habits, black or bloody stools.  No urinary tract symptoms.  No new or unusual musculoskeletal symptoms. No abnormal vaginal bleeding, discharge or unexpected pelvic pain. No new breast lumps, breast pain or nipple discharge.   Past Medical History  Diagnosis Date  . Unspecified hypothyroidism   . Generalized anxiety disorder   . Mixed hyperlipidemia   . Esophageal reflux   . Diverticulosis of colon (without mention of hemorrhage)   . Dyspnea   . Hiatal hernia   . PONV (postoperative nausea and vomiting)   . Iron deficiency  anemia 01/19/2012  . Diverticulitis     has Iron deficiency anemia; High cholesterol; and Hypertension on her problem list.     is allergic to codeine; morphine and related; and sulfa antibiotics.  Ms. Mcinnis does not currently have medications on file.  Past Surgical History  Procedure Date  . Colonoscopy 2011-2008    Dr. Lovell Sheehan  . Cesarean section     First one 1978 , second on 1984  . Hysterctomy 1988  . Abdominal hysterectomy   . Tonsillectomy   . Appendectomy     Denies any headaches, dizziness, double vision, fevers, chills, night sweats, nausea, vomiting, diarrhea, constipation, chest pain, heart palpitations, shortness of breath, blood in stool, black tarry stool, urinary pain, urinary burning, urinary frequency, hematuria.   PHYSICAL EXAMINATION  ECOG PERFORMANCE STATUS: 0 - Asymptomatic  Filed Vitals:   02/16/12 1522  BP: 135/82  Pulse: 61  Temp: 97.9 F (36.6 C)    GENERAL:alert, no distress, well nourished, well developed, comfortable, cooperative and smiling SKIN: skin color, texture, turgor are normal, no rashes or significant lesions HEAD: Normocephalic, No masses, lesions, tenderness or abnormalities EYES: normal EARS: External ears normal OROPHARYNX:mucous membranes are moist  NECK: supple, trachea midline LYMPH:  not examined BREAST:not examined LUNGS: clear to auscultation and percussion HEART: regular rate & rhythm, no murmurs, no gallops, S1 normal and S2 normal ABDOMEN:abdomen soft, non-tender and  normal bowel sounds BACK: Back symmetric, no curvature., No CVA tenderness EXTREMITIES:less then 2 second capillary refill, no joint deformities, effusion, or inflammation, no edema, no skin discoloration, no clubbing, no cyanosis  NEURO: alert & oriented x 3 with fluent speech, no focal motor/sensory deficits, gait normal   LABORATORY DATA: CBC    Component Value Date/Time   WBC 4.8 02/15/2012 0841   RBC 4.85 02/15/2012 0841   HGB 10.6*  02/15/2012 0841   HCT 36.0 02/15/2012 0841   PLT 325 02/15/2012 0841   MCV 74.2* 02/15/2012 0841   MCH 21.9* 02/15/2012 0841   MCHC 29.4* 02/15/2012 0841   RDW 18.6* 01/14/2012 1500   LYMPHSABS 1.6 01/14/2012 1500   MONOABS 0.3 01/14/2012 1500   EOSABS 0.1 01/14/2012 1500   BASOSABS 0.0 01/14/2012 1500    Lab Results  Component Value Date   IRON 36* 02/15/2012   TIBC 412 02/15/2012   FERRITIN 15 02/15/2012     ASSESSMENT:  1. Iron deficiency anemia presenting with a Hgb of 5.5 and a ferritin of 1 on 01/15/2012 2. SOB, resolved  3. HTN  4. Hypercholesterolemia  5. Depression, reactive to incident with son, take Wellbutrin low-dose  6. Insomnia, takes Ativan 0.5 mg at HS PRN  7. Hypothyroidism, on synthroid  8. Headaches, at baseline.   PLAN:  1. I personally reviewed and went over laboratory results with the patient. 2. Continue ferrous sulfate 325 mg BID daily 3. Continue Folic Acid 0.4 mg daily 4. Patient will be scheduled for a colonoscopy at the end of March. 5. Lab work in 4 weeks: CBC, ferritin 6. Lab work in 8 weeks: CBC diff, iron/TIBC, Ferritin 7. Return in 8 weeks for follow-up.   All questions were answered. The patient knows to call the clinic with any problems, questions or concerns. We can certainly see the patient much sooner if necessary.  The patient and plan discussed with Glenford Peers, MD and he is in agreement with the aforementioned.  Cynthis Purington

## 2012-02-16 NOTE — Patient Instructions (Signed)
Debra [name]  [id number] 1951-09-04   [facility]  Discharge Instructions  RECOMMENDATIONS MADE BY THE CONSULTANT AND ANY TEST RESULTS WILL BE SENT TO YOUR REFERRING DOCTOR.   EXAM FINDINGS BY MD TODAY AND SIGNS AND SYMPTOMS TO REPORT TO CLINIC OR PRIMARY MD: Report increased ice intake or increased fatigue.  MEDICATIONS PRESCRIBED: no changes     SPECIAL INSTRUCTIONS/FOLLOW-UP: Lab work Needed in 4 weeks and 8 weeks and Return to see Dr. Mariel Sleet in 8 weeks after labs.   I acknowledge that I have been informed and understand all the instructions given to me and received a copy. I do not have any more questions at this time, but understand that I may call the Specialty [facility] at [facility] at [phone] during business hours should I have any further questions or need assistance in obtaining follow-up care.    __________________________________________  _____________  __________ Signature of Patient or Authorized Representative            Date                   Time    __________________________________________ Nurse's Signature

## 2012-02-25 ENCOUNTER — Other Ambulatory Visit (INDEPENDENT_AMBULATORY_CARE_PROVIDER_SITE_OTHER): Payer: Self-pay | Admitting: *Deleted

## 2012-02-25 ENCOUNTER — Encounter (INDEPENDENT_AMBULATORY_CARE_PROVIDER_SITE_OTHER): Payer: Self-pay | Admitting: *Deleted

## 2012-02-25 DIAGNOSIS — D509 Iron deficiency anemia, unspecified: Secondary | ICD-10-CM

## 2012-02-26 ENCOUNTER — Encounter (HOSPITAL_COMMUNITY): Payer: Self-pay | Admitting: Pharmacy Technician

## 2012-03-10 ENCOUNTER — Encounter (HOSPITAL_COMMUNITY): Payer: Self-pay | Admitting: *Deleted

## 2012-03-10 ENCOUNTER — Ambulatory Visit (HOSPITAL_COMMUNITY)
Admission: RE | Admit: 2012-03-10 | Discharge: 2012-03-10 | Disposition: A | Discharge status: Home Health | Payer: BC Managed Care – PPO | Source: Ambulatory Visit | Attending: Internal Medicine | Admitting: Internal Medicine

## 2012-03-10 ENCOUNTER — Encounter (HOSPITAL_COMMUNITY)
Admission: RE | Disposition: A | Discharge status: Home Health | Payer: Self-pay | Source: Ambulatory Visit | Attending: Internal Medicine

## 2012-03-10 DIAGNOSIS — D509 Iron deficiency anemia, unspecified: Secondary | ICD-10-CM

## 2012-03-10 DIAGNOSIS — D126 Benign neoplasm of colon, unspecified: Secondary | ICD-10-CM

## 2012-03-10 DIAGNOSIS — K644 Residual hemorrhoidal skin tags: Secondary | ICD-10-CM | POA: Insufficient documentation

## 2012-03-10 DIAGNOSIS — K5730 Diverticulosis of large intestine without perforation or abscess without bleeding: Secondary | ICD-10-CM

## 2012-03-10 DIAGNOSIS — R933 Abnormal findings on diagnostic imaging of other parts of digestive tract: Secondary | ICD-10-CM

## 2012-03-10 DIAGNOSIS — E785 Hyperlipidemia, unspecified: Secondary | ICD-10-CM | POA: Insufficient documentation

## 2012-03-10 HISTORY — PX: COLONOSCOPY: SHX5424

## 2012-03-10 SURGERY — COLONOSCOPY
Anesthesia: Moderate Sedation

## 2012-03-10 MED ORDER — MIDAZOLAM HCL 5 MG/5ML IJ SOLN
INTRAMUSCULAR | Status: AC
  Filled 2012-03-10: qty 10

## 2012-03-10 MED ORDER — SODIUM CHLORIDE 0.45 % IV SOLN
Freq: Once | INTRAVENOUS | Status: AC
  Administered 2012-03-10: 13:00:00 via INTRAVENOUS

## 2012-03-10 MED ORDER — ONDANSETRON HCL 4 MG/2ML IJ SOLN
4.0000 mg | Freq: Once | INTRAMUSCULAR | Status: AC
  Administered 2012-03-10: 4 mg via INTRAVENOUS

## 2012-03-10 MED ORDER — MIDAZOLAM HCL 5 MG/5ML IJ SOLN
INTRAMUSCULAR | Status: DC | PRN
  Administered 2012-03-10 (×4): 2 mg via INTRAVENOUS

## 2012-03-10 MED ORDER — MEPERIDINE HCL 50 MG/ML IJ SOLN
INTRAMUSCULAR | Status: AC
  Filled 2012-03-10: qty 1

## 2012-03-10 MED ORDER — SIMETHICONE 40 MG/0.6ML PO SUSP
ORAL | Status: DC | PRN
  Administered 2012-03-10: 14:00:00

## 2012-03-10 MED ORDER — ONDANSETRON HCL 4 MG/2ML IJ SOLN
INTRAMUSCULAR | Status: AC
  Filled 2012-03-10: qty 2

## 2012-03-10 MED ORDER — ATROPINE SULFATE 0.1 MG/ML IJ SOLN
INTRAMUSCULAR | Status: DC | PRN
  Administered 2012-03-10: 1 mg via INTRAVENOUS

## 2012-03-10 MED ORDER — MEPERIDINE HCL 50 MG/ML IJ SOLN
INTRAMUSCULAR | Status: DC | PRN
  Administered 2012-03-10 (×2): 25 mg via INTRAVENOUS

## 2012-03-10 MED ORDER — ATROPINE SULFATE 1 MG/ML IJ SOLN
INTRAMUSCULAR | Status: AC
  Filled 2012-03-10: qty 1

## 2012-03-10 NOTE — Op Note (Signed)
COLONOSCOPY PROCEDURE REPORT  PATIENT:  Debra [name]  MR#:  [id number] Birthdate:  04/18/1951, 61 y.o., female Endoscopist:  Dr. Malissa Hippo, MD Referred By:  Dr. Ara Kussmaul, MD Procedure Date: 03/10/2012  Procedure:   Colonoscopy  Indications:  Patient is 61 year old Caucasian female who was found to have profound anemia confirmed to be due to iron deficiency. This was discovered when she has preop lab for surgery for hiatal hernia. She has history of diverticulitis. She is thickening this segment of sigmoid colon back in November 2012. She was treated with antibiotic and now asymptomatic. She is undergoing diagnostic colonoscopy to nature she does not have focal colitis or neoplasm. She had EGD and colonoscopy by Dr. Lovell Sheehan in February 2011. She had a hiatal hernia and colonic diverticulosis.  Informed Consent:  The procedure and risks were reviewed with the patient and informed consent was obtained.  Medications:  Demerol 50 mg IV Versed 8 mg IV Atropine  0.5 mg IV  Description of procedure:  After a digital rectal exam was performed, that colonoscope was advanced from the anus through the rectum and colon to the area of the cecum, ileocecal valve and appendiceal orifice. The cecum was deeply intubated. These structures were well-seen and photographed for the record. From the level of the cecum and ileocecal valve, the scope was slowly and cautiously withdrawn. The mucosal surfaces were carefully surveyed utilizing scope tip to flexion to facilitate fold flattening as needed. The scope was pulled down into the rectum where a thorough exam including retroflexion was performed. Short segment of terminal ileum was also examined.  Findings:   Prep excellent. Normal terminal ileum. Scattered though a tubular throughout the colon. Small polyp ablated via cold biopsy from proximal sigmoid colon. No evidence of colitis or  Mass. Small hemorrhoids below the dentate line and thickened  anoderm.   Therapeutic/Diagnostic Maneuvers Performed:  See above  Complications:  None  Cecal Withdrawal Time:  13 minutes  Impression:  Normal terminal ileum. Pancolonic diverticulosis without evidence of colitis or colonic tumor. Small polyp ablated via cold biopsy from proximal sigmoid colon. Small external hemorrhoids.  Recommendations:  Standard instructions given. Patient will resume ferrous sulfate as before. I will contact patient with biopsy results. Will continue to monitor H&H and may need given capsule study if H&H remains low or has overt GI bleed. [name] U  03/10/2012 2:32 PM  CC: Dr. [name], MD, MD & Dr. Bonnetta Barry ref. provider found

## 2012-03-10 NOTE — H&P (Signed)
Debra [name] is an 61 y.o. female.   Chief Complaint: Patient is here for colonoscopy. HPI: Patient is a 61 year old Caucasian female who was recently found to have profound anemia with hemoglobin of 5.8 g confirmed to be due to iron deficiency. When she was seen in our office her stool was guaiac-negative. Her anemia has responded to oral iron therapy. She had abdominopelvic CT in November 2012 which showed thickening to segment of sigmoid colon that the pericolonic inflammatory changes and she was treated with Cipro. She is here for diagnostic colonoscopy. Denies abdominal pain melena or rectal bleeding. She has not taken iron pills for over 10 days. Past Medical History  Diagnosis Date  . Unspecified hypothyroidism   . Generalized anxiety disorder   . Mixed hyperlipidemia   . Esophageal reflux   . Diverticulosis of colon (without mention of hemorrhage)   . Dyspnea   . Hiatal hernia   . PONV (postoperative nausea and vomiting)   . Iron deficiency anemia 01/19/2012  . Diverticulitis     Past Surgical History  Procedure Date  . Colonoscopy 2011-2008    Dr. Lovell Sheehan  . Cesarean section     First one 1978 , second on 1984  . Hysterctomy 1988  . Abdominal hysterectomy   . Tonsillectomy   . Appendectomy     Family History  Problem Relation Age of Onset  . Rheum arthritis Mother   . Heart disease Mother   . Healthy Son   . Healthy Son   . Anesthesia problems Neg Hx   . Hypotension Neg Hx   . Malignant hyperthermia Neg Hx   . Pseudochol deficiency Neg Hx    Social History:  reports that she has never smoked. She has never used smokeless tobacco. She reports that she does not drink alcohol or use illicit drugs.  Allergies:  Allergies  Allergen Reactions  . Codeine     Nausea and vomiting  . Morphine And Related Nausea And Vomiting  . Sulfa Antibiotics Hives    Medications Prior to Admission  Medication Dose Route Frequency Provider Last Rate Last Dose  . 0.45 % sodium  chloride infusion   Intravenous Once Malissa Hippo, MD 20 mL/hr at 03/10/12 1325     Medications Prior to Admission  Medication Sig Dispense Refill  . pravastatin (PRAVACHOL) 10 MG tablet Take 10 mg by mouth daily.        . propranolol (INDERAL) 80 MG tablet Take 80 mg by mouth daily.       Marland Kitchen buPROPion (WELLBUTRIN SR) 150 MG 12 hr tablet Take 150 mg by mouth daily. Can take up to 2xdaily      . ferrous sulfate 325 (65 FE) MG tablet Take 325 mg by mouth 2 (two) times daily before a meal.       . folic acid (FOLVITE) 400 MCG tablet Take 400 mcg by mouth daily.      Marland Kitchen levothyroxine (SYNTHROID, LEVOTHROID) 100 MCG tablet Take 100 mcg by mouth daily.        Marland Kitchen LORazepam (ATIVAN) 0.5 MG tablet Take 0.5-1 mg by mouth at bedtime.       . Multiple Vitamin (MULITIVITAMIN WITH MINERALS) TABS Take 1 tablet by mouth daily.      . ranitidine (ZANTAC) 150 MG tablet Take 150 mg by mouth at bedtime.      . vitamin B-12 (CYANOCOBALAMIN) 250 MCG tablet Take 250 mcg by mouth daily.        No results  found for this or any previous visit (from the past 48 hour(s)). No results found.  Review of Systems  Constitutional: Negative for weight loss.  Gastrointestinal: Negative for abdominal pain, diarrhea, constipation, blood in stool and melena.    Blood pressure 136/81, pulse 58, temperature 97.7 F (36.5 C), temperature source Oral, resp. rate 20. Physical Exam  Constitutional: She appears well-developed and well-nourished.  HENT:  Mouth/Throat: Oropharynx is clear and moist.  Eyes: Conjunctivae are normal. No scleral icterus.  Neck: No thyromegaly present.  Cardiovascular: Normal rate, regular rhythm and normal heart sounds.   No murmur heard. Respiratory: Effort normal and breath sounds normal.  GI: Soft. She exhibits no mass. There is no tenderness.  Musculoskeletal: She exhibits no edema.  Lymphadenopathy:    She has no cervical adenopathy.  Neurological: She is alert.  Skin: Skin is warm and  dry.     Assessment/Plan Iron deficiency anemia. Thickened amount of sigmoid colon on abdominopelvic CT. Diagnostic colonoscopy.  Jerimey Burridge U 03/10/2012, 1:54 PM

## 2012-03-10 NOTE — Discharge Instructions (Addendum)
Resume ferrous sulfate as before. Resume other medications as before. High fiber diet. No driving for 24 hours. Physician will contact you with results of biopsy.  Colonoscopy Care After Read the instructions outlined below and refer to this sheet in the next few weeks. These discharge instructions provide you with general information on caring for yourself after you leave the hospital. Your doctor may also give you specific instructions. While your treatment has been planned according to the most current medical practices available, unavoidable complications occasionally occur. If you have any problems or questions after discharge, call your doctor. HOME CARE INSTRUCTIONS ACTIVITY:  You may resume your regular activity, but move at a slower pace for the next 24 hours.   Take frequent rest periods for the next 24 hours.   Walking will help get rid of the air and reduce the bloated feeling in your belly (abdomen).   No driving for 24 hours (because of the medicine (anesthesia) used during the test).   You may shower.   Do not sign any important legal documents or operate any machinery for 24 hours (because of the anesthesia used during the test).  NUTRITION:  Drink plenty of fluids.   You may resume your normal diet as instructed by your doctor.   Begin with a light meal and progress to your normal diet. Heavy or fried foods are harder to digest and may make you feel sick to your stomach (nauseated).   Avoid alcoholic beverages for 24 hours or as instructed.  MEDICATIONS:  You may resume your normal medications unless your doctor tells you otherwise.  WHAT TO EXPECT TODAY:  Some feelings of bloating in the abdomen.   Passage of more gas than usual.   Spotting of blood in your stool or on the toilet paper.  IF YOU HAD POLYPS REMOVED DURING THE COLONOSCOPY:  No aspirin products for 7 days or as instructed.   No alcohol for 7 days or as instructed.   Eat a soft diet for  the next 24 hours.  FINDING OUT THE RESULTS OF YOUR TEST Not all test results are available during your visit. If your test results are not back during the visit, make an appointment with your caregiver to find out the results. Do not assume everything is normal if you have not heard from your caregiver or the medical facility. It is important for you to follow up on all of your test results.  SEEK IMMEDIATE MEDICAL CARE IF:  You have more than a spotting of blood in your stool.   Your belly is swollen (abdominal distention).   You are nauseated or vomiting.   You have a fever.   You have abdominal pain or discomfort that is severe or gets worse throughout the day.  Document Released: 07/07/2004 Document Revised: 11/12/2011 Document Reviewed: 07/05/2008 [facility] Patient Information 2012 [place], [place].  Colon Polyps A polyp is extra tissue that grows inside your body. Colon polyps grow in the large intestine. The large intestine, also called the colon, is part of your digestive system. It is a long, hollow tube at the end of your digestive tract where your body makes and stores stool. Most polyps are not dangerous. They are benign. This means they are not cancerous. But over time, some types of polyps can turn into cancer. Polyps that are smaller than a pea are usually not harmful. But larger polyps could someday become or may already be cancerous. To be safe, doctors remove all polyps and test  them.  WHO GETS POLYPS? Anyone can get polyps, but certain people are more likely than others. You may have a greater chance of getting polyps if:  You are over 50.   You have had polyps before.   Someone in your family has had polyps.   Someone in your family has had cancer of the large intestine.   Find out if someone in your family has had polyps. You may also be more likely to get polyps if you:   Eat a lot of fatty foods.   Smoke.   Drink alcohol.   Do not exercise.   Eat too  much.  SYMPTOMS  Most small polyps do not cause symptoms. People often do not know they have one until their caregiver finds it during a regular checkup or while testing them for something else. Some people do have symptoms like these:  Bleeding from the anus. You might notice blood on your underwear or on toilet paper after you have had a bowel movement.   Constipation or diarrhea that lasts more than a week.   Blood in the stool. Blood can make stool look black or it can show up as red streaks in the stool.  If you have any of these symptoms, see your caregiver. HOW DOES THE DOCTOR TEST FOR POLYPS? The doctor can use four tests to check for polyps:  Digital rectal exam. The caregiver wears gloves and checks your rectum (the last part of the large intestine) to see if it feels normal. This test would find polyps only in the rectum. Your caregiver may need to do one of the other tests listed below to find polyps higher up in the intestine.   Barium enema. The caregiver puts a liquid called barium into your rectum before taking x-rays of your large intestine. Barium makes your intestine look white in the pictures. Polyps are dark, so they are easy to see.   Sigmoidoscopy. With this test, the caregiver can see inside your large intestine. A thin flexible tube is placed into your rectum. The device is called a sigmoidoscope, which has a light and a tiny video camera in it. The caregiver uses the sigmoidoscope to look at the last third of your large intestine.   Colonoscopy. This test is like sigmoidoscopy, but the caregiver looks at all of the large intestine. It usually requires sedation. This is the most common method for finding and removing polyps.  TREATMENT   The caregiver will remove the polyp during sigmoidoscopy or colonoscopy. The polyp is then tested for cancer.   If you have had polyps, your caregiver may want you to get tested regularly in the future.  PREVENTION  There is not  one sure way to prevent polyps. You might be able to lower your risk of getting them if you:  Eat more fruits and vegetables and less fatty food.   Do not smoke.   Avoid alcohol.   Exercise every day.   Lose weight if you are overweight.   Eating more calcium and folate can also lower your risk of getting polyps. Some foods that are rich in calcium are milk, cheese, and broccoli. Some foods that are rich in folate are chickpeas, kidney beans, and spinach.   Aspirin might help prevent polyps. Studies are under way.  Document Released: 08/19/2004 Document Revised: 11/12/2011 Document Reviewed: 01/25/2008 [facility] Patient Information 2012 [place], [place].  Iron Deficiency Anemia There are many types of anemia. Iron deficiency anemia is the most common.  Iron deficiency anemia is a decrease in the number of red blood cells caused by too little iron. Without enough iron, your body does not produce enough hemoglobin. Hemoglobin is a substance in red blood cells that carries oxygen to the body's tissues. Iron deficiency anemia may leave you tired and short of breath. CAUSES   Lack of iron in the diet.   This may be seen in infants and children, because there is little iron in milk.   This may be seen in adults who do not eat enough iron-rich foods.   This may be seen in pregnant or breastfeeding women who do not take iron supplements. There is a much higher need for iron intake at these times.   Poor absorption of iron, as seen with intestinal disorders.   Intestinal bleeding.   Heavy periods.  SYMPTOMS  Mild anemia may not be noticeable. Symptoms may include:  Fatigue.   Headache.   Pale skin.   Weakness.   Shortness of breath.   Dizziness.   Cold hands and feet.   Fast or irregular heartbeat.  DIAGNOSIS  Diagnosis requires a thorough evaluation and physical exam by your caregiver.  Blood tests are generally used to confirm iron deficiency anemia.   Additional tests  may be done to find the underlying cause of your anemia. These may include:   Testing for blood in the stool (fecal occult blood test).   A procedure to see inside the colon and rectum (colonoscopy).   A procedure to see inside the esophagus and stomach (endoscopy).  TREATMENT   Correcting the cause of the iron deficiency is the first step.   Medicines, such as oral contraceptives, can make heavy menstrual flows lighter.   Antibiotics and other medicines can be used to treat peptic ulcers.   Surgery may be needed to remove a bleeding polyp, tumor, or fibroid.   Often, iron supplements (ferrous sulfate) are taken.   For the best iron absorption, take these supplements with an empty stomach.   You may need to take the supplements with food if you cannot tolerate them on an empty stomach. Vitamin C improves the absorption of iron. Your caregiver may recommend taking your iron tablets with a glass of orange juice or vitamin C supplement.   Milk and antacids should not be taken at the same time as iron supplements. They may interfere with the absorption of iron.   Iron supplements can cause constipation. A stool softener is often recommended.   Pregnant and breastfeeding women will need to take extra iron, because their normal diet usually will not provide the required amount.   Patients who cannot tolerate iron by mouth can take it through a vein (intravenously) or by an injection into the muscle.  HOME CARE INSTRUCTIONS   Ask your dietitian for help with diet questions.   Take iron and vitamins as directed by your caregiver.   Eat a diet rich in iron. Eat liver, lean beef, whole-grain bread, eggs, dried fruit, and dark green leafy vegetables.  SEEK IMMEDIATE MEDICAL CARE IF:   You have a fainting episode. Do not drive yourself. Call your local emergency services (911 in [place] if no other help is available.   You have chest pain, nausea, or vomiting.   You develop severe or  increased shortness of breath with activities.   You develop weakness or increased thirst.   You have a rapid heartbeat.   You develop unexplained sweating or become lightheaded when getting up  from a chair or bed.  MAKE SURE YOU:   Understand these instructions.   Will watch your condition.   Will get help right away if you are not doing well or get worse.  Document Released: 11/20/2000 Document Revised: 11/12/2011 Document Reviewed: 04/01/2010 [facility] Patient Information 2012 [place], [place].

## 2012-03-14 ENCOUNTER — Encounter (HOSPITAL_COMMUNITY): Payer: BC Managed Care – PPO | Attending: Oncology

## 2012-03-14 DIAGNOSIS — D509 Iron deficiency anemia, unspecified: Secondary | ICD-10-CM | POA: Insufficient documentation

## 2012-03-14 LAB — CBC
HCT: 39.3 % (ref 36.0–46.0)
Hemoglobin: 12.3 g/dL (ref 12.0–15.0)
MCH: 24.2 pg — ABNORMAL LOW (ref 26.0–34.0)
MCV: 77.2 fL — ABNORMAL LOW (ref 78.0–100.0)
RBC: 5.09 MIL/uL (ref 3.87–5.11)

## 2012-03-14 LAB — FERRITIN: Ferritin: 16 ng/mL (ref 10–291)

## 2012-03-14 NOTE — Progress Notes (Signed)
Labs drawn today for cbc,ferr 

## 2012-03-15 ENCOUNTER — Other Ambulatory Visit (HOSPITAL_COMMUNITY): Payer: BC Managed Care – PPO

## 2012-03-17 ENCOUNTER — Encounter (HOSPITAL_COMMUNITY): Payer: Self-pay | Admitting: Internal Medicine

## 2012-03-21 ENCOUNTER — Encounter (INDEPENDENT_AMBULATORY_CARE_PROVIDER_SITE_OTHER): Payer: Self-pay | Admitting: *Deleted

## 2012-04-08 ENCOUNTER — Other Ambulatory Visit (HOSPITAL_COMMUNITY): Payer: BC Managed Care – PPO

## 2012-04-12 ENCOUNTER — Other Ambulatory Visit (HOSPITAL_COMMUNITY): Payer: BC Managed Care – PPO

## 2012-04-13 ENCOUNTER — Ambulatory Visit (HOSPITAL_COMMUNITY): Payer: BC Managed Care – PPO | Admitting: Oncology

## 2012-04-18 ENCOUNTER — Encounter (INDEPENDENT_AMBULATORY_CARE_PROVIDER_SITE_OTHER): Payer: Self-pay

## 2012-05-05 NOTE — Patient Instructions (Addendum)
PATIENT INSTRUCTIONS POST-ANESTHESIA  IMMEDIATELY FOLLOWING SURGERY:  Do not drive or operate machinery for the first twenty four hours after surgery.  Do not make any important decisions for twenty four hours after surgery or while taking narcotic pain medications or sedatives.  If you develop intractable nausea and vomiting or a severe headache please notify your doctor immediately.  FOLLOW-UP:  Please make an appointment with your surgeon as instructed. You do not need to follow up with anesthesia unless specifically instructed to do so.  WOUND CARE INSTRUCTIONS (if applicable):  Keep a dry clean dressing on the anesthesia/puncture wound site if there is drainage.  Once the wound has quit draining you may leave it open to air.  Generally you should leave the bandage intact for twenty four hours unless there is drainage.  If the epidural site drains for more than 36-48 hours please call the anesthesia department.  QUESTIONS?:  Please feel free to call your physician or the hospital operator if you have any questions, and they will be happy to assist you.     20 WANA MOUNT  05/05/2012   Your procedure is scheduled on:  05/11/2012  Report to [facility] at  730  AM.  Call this number if you have problems the morning of surgery: [phone]   Remember:   Do not eat food:After Midnight.  May have clear liquids:until Midnight .    Take these medicines the morning of surgery with A SIP OF WATER: ativan,wellbutrin,synthroid,inderal,zantac   Do not wear jewelry, make-up or nail polish.  Do not wear lotions, powders, or perfumes. You may wear deodorant.  Do not shave 48 hours prior to surgery. Men may shave face and neck.  Do not bring valuables to the hospital.  Contacts, dentures or bridgework may not be worn into surgery.  Leave suitcase in the car. After surgery it may be brought to your room.  For patients admitted to the hospital, checkout time is 11:00 AM the day of  discharge.   Patients discharged the day of surgery will not be allowed to drive home.  Name and phone number of your driver. Husband-Stan  Special Instructions: CHG Shower Use Special Wash: 1/2 bottle night before surgery and 1/2 bottle morning of surgery.   Please read over the following fact sheets that you were given: Pain Booklet, MRSA Information and Surgical Site Infection Prevention Laparoscopic Bowel Resection Care After Refer to this sheet in the next few weeks. These discharge instructions provide you with general information on caring for yourself after you leave the hospital. Your caregiver may also give you specific instructions. Your treatment has been planned according to the most current medical practices available, but unavoidable complications sometimes occur. If you have any problems or questions after discharge, call your caregiver. HOME CARE INSTRUCTIONS   It will be normal to be sore for a couple weeks following surgery. See your caregiver if this seems to be getting worse rather than better.   Only take over-the-counter or prescription medicines for pain, discomfort, or fever as directed by your caregiver.   Use showers for bathing until you see your caregiver, or as instructed.   Change dressings if necessary or as directed.   You may resume a normal diet and activities as directed.   Avoid lifting or driving until your caregiver says it is okay.   Make an appointment to see your caregiver for stitch or staple removal when instructed.   You may put ice on your incision.  Put ice in a plastic bag.   Place a towel between your skin and the bag.   Leave the ice on for 15 to 20 minutes at a time, 3 to 4 times a day, for the first 2 days.  SEEK MEDICAL CARE IF:   There is redness, swelling, or increasing pain in the wound area.   Pus is coming from the wound.   You have an oral temperature above 102 F (38.9 C).   You notice a bad smell coming from the  wound or bandage (dressing).   The wound breaks open after stitches (sutures) have been removed.  SEEK IMMEDIATE MEDICAL CARE IF:   You develop a rash.   You develop any reaction or side effects to the medicines given.   You develop pain in your shoulders (shoulder strap area) which gets progressively more severe. Some pain is common and expected because of the gas inserted into your abdomen during the procedure.   You develop bleeding or drainage from the suture sites following surgery.   You develop dizziness or fainting while standing, shortness of breath, or difficulty breathing.   You develop persistent nausea or vomiting or increased abdominal pain.   You cannot have a bowel movement or pass gas.  MAKE SURE YOU:   Understand these instructions.   Will watch your condition.   Will get help right away if you are not doing well or get worse.  Document Released: 06/12/2005 Document Revised: 11/12/2011 Document Reviewed: 04/14/2010 [facility] Patient Information 2012 [place], [place].Laparoscopic Bowel Resection Laparoscopic bowel resection is used to remove a piece of large or small intestine that may be red, sore, or swollen (inflamed) or to remove a portion of bowel that is blocked. LET YOUR CAREGIVER KNOW ABOUT:   Allergies to food or medicine.   Medicines taken, including vitamins, herbs, eyedrops, over-the-counter medicines, and creams.   Use of steroids (by mouth or creams).   Previous problems with anesthetics or numbing medicines.   History of bleeding problems or blood clots.   Previous surgery.   Other health problems, including diabetes and kidney problems.   Possibility of pregnancy, if this applies.  RISKS AND COMPLICATIONS   Infection.   Bleeding.   Injury to other organs.   Anesthetic side effects.   Leakage from where the bowel is put back together (anastomosis).   Long delay before return of bowel function (ileus).  BEFORE THE PROCEDURE    You should be present 60 minutes before your procedure or as directed by your caregiver.  PROCEDURE  Laparoscopic means that the procedure is done with a thin, lighted tube (laparoscope). Once you are given medicine that makes you sleep (general anesthetic), your surgeon inflates your abdomenwith carbon dioxide gas. The laparoscope is put into your abdomen through a small cut (incision). This allows your surgeon to see into the abdomen. A video camera is attached to the laparoscope to enlarge the view. The surgeon sees this image on a monitor. During the procedure, the portion of bowel to be removed is taken out through one of the incisions. The incision may need to be enlarged if the bowel is too large to be removed through one of the smaller incisions. In this case, a small incision will be made and sometimes the bowel repair is made outside the abdomen. AFTER THE PROCEDURE  If there are no problems, recovery time is brief compared to regular surgery. You will rest in a recovery room until you are stable and doing  well. Following this, if you have no other problems, you will be allowed to return to your room. Recovery time varies depending on what is found during surgery, your age, and your general health. Sometimes, it takes a few days for bowel function to return (passing gas, bowel movements). You will stay in the hospital until bowel function returns. Sometimes, a tube is placed in your stomach, through your nose (nasogastric tube). This tube is used to release pressure from your stomach (decompress) and to decrease nausea until bowel function returns. Document Released: 05/19/2001 Document Revised: 11/12/2011 Document Reviewed: 04/14/2010 [facility] Patient Information 2012 [place], [place].

## 2012-05-05 NOTE — H&P (Signed)
Debra [name] is an 61 y.o. female.   Chief Complaint: Diverticulosis, h/o diverticulitis HPI: 61 yo wf who has a h/o sigmoid diverticulitis.  Currently is asymptomatic, thus would like to proceed with partial colectomy.    Past Medical History  Diagnosis Date  . Unspecified hypothyroidism   . Generalized anxiety disorder   . Mixed hyperlipidemia   . Esophageal reflux   . Diverticulosis of colon (without mention of hemorrhage)   . Dyspnea   . Hiatal hernia   . PONV (postoperative nausea and vomiting)   . Iron deficiency anemia 01/19/2012  . Diverticulitis     Past Surgical History  Procedure Date  . Colonoscopy 2011-2008    Dr. Lovell Sheehan  . Cesarean section     First one 1978 , second on 1984  . Hysterctomy 1988  . Abdominal hysterectomy   . Tonsillectomy   . Appendectomy   . Colonoscopy 03/10/2012    Procedure: COLONOSCOPY;  Surgeon: Malissa Hippo, MD;  Location: [facility];  Service: Endoscopy;  Laterality: N/A;  200    Family History  Problem Relation Age of Onset  . Rheum arthritis Mother   . Heart disease Mother   . Healthy Son   . Healthy Son   . Anesthesia problems Neg Hx   . Hypotension Neg Hx   . Malignant hyperthermia Neg Hx   . Pseudochol deficiency Neg Hx    Social History:  reports that she has never smoked. She has never used smokeless tobacco. She reports that she does not drink alcohol or use illicit drugs.  Allergies:  Allergies  Allergen Reactions  . Codeine     Nausea and vomiting  . Morphine And Related Nausea And Vomiting  . Sulfa Antibiotics Hives    No prescriptions prior to admission    No results found for this or any previous visit (from the past 48 hour(s)). No results found.  Review of Systems  Constitutional: Negative.   HENT: Negative.   Eyes: Negative.   Respiratory: Negative.   Cardiovascular: Negative.   Gastrointestinal: Negative.   Genitourinary: Negative.   Musculoskeletal: Negative.   Skin: Negative.     Neurological: Negative.   Endo/Heme/Allergies: Negative.   Psychiatric/Behavioral: Negative.     There were no vitals taken for this visit. Physical Exam  Constitutional: She is oriented to person, place, and time. She appears well-developed and well-nourished.  HENT:  Head: Normocephalic and atraumatic.  Neck: Normal range of motion. Neck supple.  Cardiovascular: Normal rate, regular rhythm and normal heart sounds.   Respiratory: Effort normal and breath sounds normal.  GI: Soft. Bowel sounds are normal. She exhibits no distension. There is no tenderness.  Neurological: She is alert and oriented to person, place, and time.  Skin: Skin is warm and dry.     Assessment/Plan Imp:  Diverticulosis, h/o divericulitis Plan:  Scheduled for laparoscopic hand assisted partial colectomy on 05/11/12.  Risks and benefits of procedure including bleeding, infection, and the possibility of a blood transfusion were fully explained to the patient, who gives informed consent.  Debra [name] A 05/05/2012, 7:51 PM

## 2012-05-06 ENCOUNTER — Encounter (HOSPITAL_COMMUNITY): Payer: Self-pay

## 2012-05-06 ENCOUNTER — Encounter (HOSPITAL_COMMUNITY)
Admission: RE | Admit: 2012-05-06 | Discharge: 2012-05-06 | Disposition: A | Discharge status: Home / Self Care | Payer: BC Managed Care – PPO | Source: Ambulatory Visit | Attending: General Surgery | Admitting: General Surgery

## 2012-05-06 LAB — DIFFERENTIAL
Lymphs Abs: 2 10*3/uL (ref 0.7–4.0)
Monocytes Relative: 9 % (ref 3–12)
Neutro Abs: 3.6 10*3/uL (ref 1.7–7.7)
Neutrophils Relative %: 56 % (ref 43–77)

## 2012-05-06 LAB — CBC
Hemoglobin: 12.4 g/dL (ref 12.0–15.0)
MCH: 27.6 pg (ref 26.0–34.0)
RBC: 4.5 MIL/uL (ref 3.87–5.11)

## 2012-05-06 LAB — BASIC METABOLIC PANEL
Chloride: 103 mEq/L (ref 96–112)
GFR calc Af Amer: 67 mL/min — ABNORMAL LOW (ref >90)
GFR calc non Af Amer: 57 mL/min — ABNORMAL LOW (ref >90)
Glucose, Bld: 88 mg/dL (ref 70–99)
Potassium: 4.2 mEq/L (ref 3.5–5.1)
Sodium: 135 mEq/L (ref 135–145)

## 2012-05-09 ENCOUNTER — Encounter (HOSPITAL_COMMUNITY): Payer: Self-pay | Admitting: Pharmacy Technician

## 2012-05-09 MED ORDER — MUPIROCIN 2 % EX OINT
TOPICAL_OINTMENT | CUTANEOUS | Status: AC
  Filled 2012-05-09: qty 22

## 2012-05-10 LAB — ABO/RH: ABO/RH(D): O NEG

## 2012-05-11 ENCOUNTER — Encounter (HOSPITAL_COMMUNITY): Payer: Self-pay | Admitting: *Deleted

## 2012-05-11 ENCOUNTER — Encounter (HOSPITAL_COMMUNITY)
Admission: RE | Disposition: A | Discharge status: Home / Self Care | Payer: Self-pay | Source: Ambulatory Visit | Attending: General Surgery

## 2012-05-11 ENCOUNTER — Inpatient Hospital Stay (HOSPITAL_COMMUNITY): Payer: BC Managed Care – PPO | Admitting: Anesthesiology

## 2012-05-11 ENCOUNTER — Inpatient Hospital Stay (HOSPITAL_COMMUNITY)
Admission: RE | Admit: 2012-05-11 | Discharge: 2012-05-13 | DRG: 149 | Disposition: A | Discharge status: Home / Self Care | Payer: BC Managed Care – PPO | Source: Ambulatory Visit | Attending: General Surgery | Admitting: General Surgery

## 2012-05-11 ENCOUNTER — Encounter (HOSPITAL_COMMUNITY): Payer: Self-pay | Admitting: Anesthesiology

## 2012-05-11 DIAGNOSIS — E782 Mixed hyperlipidemia: Secondary | ICD-10-CM | POA: Diagnosis present

## 2012-05-11 DIAGNOSIS — F411 Generalized anxiety disorder: Secondary | ICD-10-CM | POA: Diagnosis present

## 2012-05-11 DIAGNOSIS — E039 Hypothyroidism, unspecified: Secondary | ICD-10-CM | POA: Diagnosis present

## 2012-05-11 DIAGNOSIS — K5732 Diverticulitis of large intestine without perforation or abscess without bleeding: Principal | ICD-10-CM | POA: Diagnosis present

## 2012-05-11 DIAGNOSIS — K219 Gastro-esophageal reflux disease without esophagitis: Secondary | ICD-10-CM | POA: Diagnosis present

## 2012-05-11 HISTORY — PX: BOWEL RESECTION: SHX1257

## 2012-05-11 HISTORY — PX: COLON RESECTION: SHX5231

## 2012-05-11 SURGERY — COLECTOMY, HAND-ASSISTED, LAPAROSCOPIC
Anesthesia: General | Site: Abdomen | Wound class: Clean Contaminated

## 2012-05-11 MED ORDER — LIDOCAINE HCL (PF) 1 % IJ SOLN
INTRAMUSCULAR | Status: AC
  Filled 2012-05-11: qty 5

## 2012-05-11 MED ORDER — DEXAMETHASONE SODIUM PHOSPHATE 4 MG/ML IJ SOLN
INTRAMUSCULAR | Status: AC
  Administered 2012-05-11: 4 mg via INTRAVENOUS
  Filled 2012-05-11: qty 1

## 2012-05-11 MED ORDER — GLYCOPYRROLATE 0.2 MG/ML IJ SOLN
INTRAMUSCULAR | Status: DC | PRN
  Administered 2012-05-11: 0.6 mg via INTRAVENOUS

## 2012-05-11 MED ORDER — ENOXAPARIN SODIUM 40 MG/0.4ML ~~LOC~~ SOLN
SUBCUTANEOUS | Status: AC
  Administered 2012-05-11: 40 mg via SUBCUTANEOUS
  Filled 2012-05-11: qty 0.4

## 2012-05-11 MED ORDER — SODIUM CHLORIDE 0.9 % IV SOLN: 1.0000 g | INTRAVENOUS | Status: DC

## 2012-05-11 MED ORDER — EPHEDRINE SULFATE 50 MG/ML IJ SOLN
INTRAMUSCULAR | Status: AC
  Filled 2012-05-11: qty 1

## 2012-05-11 MED ORDER — ROCURONIUM BROMIDE 50 MG/5ML IV SOLN
INTRAVENOUS | Status: AC
  Filled 2012-05-11: qty 2

## 2012-05-11 MED ORDER — ALVIMOPAN 12 MG PO CAPS
12.0000 mg | ORAL_CAPSULE | Freq: Once | ORAL | Status: AC
  Administered 2012-05-11: 12 mg via ORAL

## 2012-05-11 MED ORDER — LACTATED RINGERS IV SOLN
INTRAVENOUS | Status: DC
  Administered 2012-05-12: 05:00:00 via INTRAVENOUS

## 2012-05-11 MED ORDER — ONDANSETRON HCL 4 MG/2ML IJ SOLN: 4.0000 mg | Freq: Once | INTRAMUSCULAR | Status: DC | PRN

## 2012-05-11 MED ORDER — PROPOFOL 10 MG/ML IV BOLUS
INTRAVENOUS | Status: DC | PRN
  Administered 2012-05-11: 140 mg via INTRAVENOUS

## 2012-05-11 MED ORDER — GLYCOPYRROLATE 0.2 MG/ML IJ SOLN
INTRAMUSCULAR | Status: AC
  Filled 2012-05-11: qty 1

## 2012-05-11 MED ORDER — ONDANSETRON HCL 4 MG/2ML IJ SOLN
INTRAMUSCULAR | Status: AC
  Administered 2012-05-11: 4 mg via INTRAVENOUS
  Filled 2012-05-11: qty 2

## 2012-05-11 MED ORDER — HEMOSTATIC AGENTS (NO CHARGE) OPTIME
TOPICAL | Status: DC | PRN
  Administered 2012-05-11: 1 via TOPICAL

## 2012-05-11 MED ORDER — GLYCOPYRROLATE 0.2 MG/ML IJ SOLN
INTRAMUSCULAR | Status: AC
  Filled 2012-05-11: qty 2

## 2012-05-11 MED ORDER — LACTATED RINGERS IV SOLN
INTRAVENOUS | Status: DC
  Administered 2012-05-11: 10:00:00 via INTRAVENOUS
  Administered 2012-05-11: 1000 mL via INTRAVENOUS
  Administered 2012-05-11: 11:00:00 via INTRAVENOUS

## 2012-05-11 MED ORDER — PROPOFOL 10 MG/ML IV EMUL
INTRAVENOUS | Status: AC
  Filled 2012-05-11: qty 20

## 2012-05-11 MED ORDER — DEXAMETHASONE SODIUM PHOSPHATE 4 MG/ML IJ SOLN
4.0000 mg | Freq: Once | INTRAMUSCULAR | Status: AC
  Administered 2012-05-11: 4 mg via INTRAVENOUS

## 2012-05-11 MED ORDER — FENTANYL CITRATE 0.05 MG/ML IJ SOLN
INTRAMUSCULAR | Status: AC
  Filled 2012-05-11: qty 10

## 2012-05-11 MED ORDER — MIDAZOLAM HCL 2 MG/2ML IJ SOLN
INTRAMUSCULAR | Status: AC
  Filled 2012-05-11: qty 2

## 2012-05-11 MED ORDER — PROPRANOLOL HCL ER 80 MG PO CP24
80.0000 mg | ORAL_CAPSULE | Freq: Every day | ORAL | Status: DC
  Filled 2012-05-11 (×2): qty 1

## 2012-05-11 MED ORDER — MUPIROCIN 2 % EX OINT: TOPICAL_OINTMENT | Freq: Two times a day (BID) | CUTANEOUS | Status: DC

## 2012-05-11 MED ORDER — SODIUM CHLORIDE 0.9 % IV SOLN
1.0000 g | INTRAVENOUS | Status: DC | PRN
  Administered 2012-05-11: 1 g via INTRAVENOUS

## 2012-05-11 MED ORDER — PROPRANOLOL HCL ER BEADS 80 MG PO CP24: 80.0000 mg | ORAL_CAPSULE | Freq: Every day | ORAL | Status: DC

## 2012-05-11 MED ORDER — FENTANYL CITRATE 0.05 MG/ML IJ SOLN: 25.0000 ug | INTRAMUSCULAR | Status: DC | PRN

## 2012-05-11 MED ORDER — ONDANSETRON HCL 4 MG/2ML IJ SOLN: 4.0000 mg | Freq: Four times a day (QID) | INTRAMUSCULAR | Status: DC | PRN

## 2012-05-11 MED ORDER — BUPROPION HCL ER (SR) 150 MG PO TB12
150.0000 mg | ORAL_TABLET | Freq: Every day | ORAL | Status: DC
  Filled 2012-05-11 (×3): qty 1

## 2012-05-11 MED ORDER — SODIUM CHLORIDE 0.9 % IR SOLN
Status: DC | PRN
  Administered 2012-05-11: 2000 mL

## 2012-05-11 MED ORDER — CHLORHEXIDINE GLUCONATE 4 % EX LIQD
1.0000 "application " | Freq: Once | CUTANEOUS | Status: DC
  Filled 2012-05-11: qty 15

## 2012-05-11 MED ORDER — ONDANSETRON HCL 4 MG/2ML IJ SOLN
INTRAMUSCULAR | Status: DC | PRN
  Administered 2012-05-11: 4 mg via INTRAVENOUS

## 2012-05-11 MED ORDER — ENOXAPARIN SODIUM 40 MG/0.4ML ~~LOC~~ SOLN
40.0000 mg | Freq: Once | SUBCUTANEOUS | Status: AC
  Administered 2012-05-11: 40 mg via SUBCUTANEOUS

## 2012-05-11 MED ORDER — POVIDONE-IODINE 10 % OINT PACKET
TOPICAL_OINTMENT | CUTANEOUS | Status: DC | PRN
  Administered 2012-05-11: 2 via TOPICAL

## 2012-05-11 MED ORDER — MIDAZOLAM HCL 2 MG/2ML IJ SOLN
1.0000 mg | INTRAMUSCULAR | Status: DC | PRN
  Administered 2012-05-11 (×2): 2 mg via INTRAVENOUS

## 2012-05-11 MED ORDER — ONDANSETRON HCL 4 MG/2ML IJ SOLN
4.0000 mg | Freq: Once | INTRAMUSCULAR | Status: AC
  Administered 2012-05-11: 4 mg via INTRAVENOUS

## 2012-05-11 MED ORDER — MIDAZOLAM HCL 2 MG/2ML IJ SOLN
INTRAMUSCULAR | Status: AC
  Administered 2012-05-11: 2 mg via INTRAVENOUS
  Filled 2012-05-11: qty 2

## 2012-05-11 MED ORDER — SODIUM CHLORIDE 0.9 % IV SOLN
INTRAVENOUS | Status: AC
  Filled 2012-05-11: qty 1

## 2012-05-11 MED ORDER — ONDANSETRON HCL 4 MG/2ML IJ SOLN
INTRAMUSCULAR | Status: AC
  Filled 2012-05-11: qty 2

## 2012-05-11 MED ORDER — EPHEDRINE SULFATE 50 MG/ML IJ SOLN
INTRAMUSCULAR | Status: DC | PRN
  Administered 2012-05-11: 15 mg via INTRAVENOUS

## 2012-05-11 MED ORDER — ALVIMOPAN 12 MG PO CAPS
ORAL_CAPSULE | ORAL | Status: AC
  Administered 2012-05-11: 12 mg via ORAL
  Filled 2012-05-11: qty 1

## 2012-05-11 MED ORDER — LEVOTHYROXINE SODIUM 100 MCG PO TABS
100.0000 ug | ORAL_TABLET | Freq: Every day | ORAL | Status: DC
  Administered 2012-05-12 – 2012-05-13 (×2): 100 ug via ORAL
  Filled 2012-05-11 (×2): qty 1

## 2012-05-11 MED ORDER — ACETAMINOPHEN 10 MG/ML IV SOLN
INTRAVENOUS | Status: AC
  Administered 2012-05-11: 1000 mg via INTRAVENOUS
  Filled 2012-05-11: qty 100

## 2012-05-11 MED ORDER — ROCURONIUM BROMIDE 50 MG/5ML IV SOLN
INTRAVENOUS | Status: AC
  Filled 2012-05-11: qty 1

## 2012-05-11 MED ORDER — MIDAZOLAM HCL 5 MG/5ML IJ SOLN
INTRAMUSCULAR | Status: DC | PRN
  Administered 2012-05-11: 2 mg via INTRAVENOUS

## 2012-05-11 MED ORDER — ROCURONIUM BROMIDE 100 MG/10ML IV SOLN
INTRAVENOUS | Status: DC | PRN
  Administered 2012-05-11: 30 mg via INTRAVENOUS
  Administered 2012-05-11: 50 mg via INTRAVENOUS
  Administered 2012-05-11: 10 mg via INTRAVENOUS

## 2012-05-11 MED ORDER — PANTOPRAZOLE SODIUM 40 MG PO TBEC
40.0000 mg | DELAYED_RELEASE_TABLET | Freq: Every day | ORAL | Status: DC
  Administered 2012-05-11 – 2012-05-13 (×3): 40 mg via ORAL
  Filled 2012-05-11 (×3): qty 1

## 2012-05-11 MED ORDER — FENTANYL CITRATE 0.05 MG/ML IJ SOLN
INTRAMUSCULAR | Status: DC | PRN
  Administered 2012-05-11: 50 ug via INTRAVENOUS
  Administered 2012-05-11: 100 ug via INTRAVENOUS
  Administered 2012-05-11 (×2): 25 ug via INTRAVENOUS
  Administered 2012-05-11 (×2): 50 ug via INTRAVENOUS
  Administered 2012-05-11: 150 ug via INTRAVENOUS

## 2012-05-11 MED ORDER — LORAZEPAM 2 MG/ML IJ SOLN
0.5000 mg | INTRAMUSCULAR | Status: DC | PRN
  Administered 2012-05-12: 0.5 mg via INTRAVENOUS
  Filled 2012-05-11: qty 1

## 2012-05-11 MED ORDER — FENTANYL CITRATE 0.05 MG/ML IJ SOLN
25.0000 ug | INTRAMUSCULAR | Status: DC | PRN
  Administered 2012-05-11 – 2012-05-13 (×8): 25 ug via INTRAVENOUS
  Filled 2012-05-11 (×8): qty 2

## 2012-05-11 MED ORDER — NEOSTIGMINE METHYLSULFATE 1 MG/ML IJ SOLN
INTRAMUSCULAR | Status: DC | PRN
  Administered 2012-05-11: 3 mg via INTRAVENOUS

## 2012-05-11 MED ORDER — ENOXAPARIN SODIUM 40 MG/0.4ML ~~LOC~~ SOLN
40.0000 mg | SUBCUTANEOUS | Status: DC
  Administered 2012-05-12 – 2012-05-13 (×2): 40 mg via SUBCUTANEOUS
  Filled 2012-05-11 (×2): qty 0.4

## 2012-05-11 MED ORDER — ONDANSETRON HCL 4 MG PO TABS: 4.0000 mg | ORAL_TABLET | Freq: Four times a day (QID) | ORAL | Status: DC | PRN

## 2012-05-11 MED ORDER — SCOPOLAMINE 1 MG/3DAYS TD PT72
MEDICATED_PATCH | TRANSDERMAL | Status: AC
  Administered 2012-05-11: 1.5 mg via TRANSDERMAL
  Filled 2012-05-11: qty 1

## 2012-05-11 MED ORDER — ALVIMOPAN 12 MG PO CAPS
12.0000 mg | ORAL_CAPSULE | Freq: Two times a day (BID) | ORAL | Status: DC
  Administered 2012-05-12 – 2012-05-13 (×3): 12 mg via ORAL
  Filled 2012-05-11 (×3): qty 1

## 2012-05-11 MED ORDER — ACETAMINOPHEN 10 MG/ML IV SOLN
1000.0000 mg | Freq: Four times a day (QID) | INTRAVENOUS | Status: AC
  Administered 2012-05-11 – 2012-05-12 (×4): 1000 mg via INTRAVENOUS
  Filled 2012-05-11 (×3): qty 100

## 2012-05-11 MED ORDER — BUPIVACAINE HCL (PF) 0.5 % IJ SOLN
INTRAMUSCULAR | Status: AC
  Filled 2012-05-11: qty 30

## 2012-05-11 MED ORDER — POVIDONE-IODINE 10 % EX OINT
TOPICAL_OINTMENT | CUTANEOUS | Status: AC
  Filled 2012-05-11: qty 2

## 2012-05-11 MED ORDER — SCOPOLAMINE 1 MG/3DAYS TD PT72
1.0000 | MEDICATED_PATCH | Freq: Once | TRANSDERMAL | Status: DC
  Administered 2012-05-11: 1.5 mg via TRANSDERMAL

## 2012-05-11 SURGICAL SUPPLY — 71 items
BAG HAMPER (MISCELLANEOUS) ×3 IMPLANT
BLADE SURG SZ10 CARB STEEL (BLADE) ×3 IMPLANT
CLOTH BEACON ORANGE TIMEOUT ST (SAFETY) ×3 IMPLANT
COVER LIGHT HANDLE STERIS (MISCELLANEOUS) ×6 IMPLANT
CUTTER ENDO LINEAR 45M (STAPLE) IMPLANT
DECANTER SPIKE VIAL GLASS SM (MISCELLANEOUS) IMPLANT
DRAPE INCISE IOBAN 44X35 STRL (DRAPES) ×3 IMPLANT
DRAPE WARM FLUID 44X44 (DRAPE) ×3 IMPLANT
DURAPREP 26ML APPLICATOR (WOUND CARE) ×3 IMPLANT
ELECT BLADE 6 FLAT ULTRCLN (ELECTRODE) ×3 IMPLANT
ELECT REM PT RETURN 9FT ADLT (ELECTROSURGICAL) ×3
ELECTRODE REM PT RTRN 9FT ADLT (ELECTROSURGICAL) ×2 IMPLANT
ERX105640 0.5% BUPIVICAINE ×3 IMPLANT
FILTER SMOKE EVAC LAPAROSHD (FILTER) ×6 IMPLANT
GLOVE BIO SURGEON STRL SZ7.5 (GLOVE) ×6 IMPLANT
GLOVE ECLIPSE 6.5 STRL STRAW (GLOVE) ×6 IMPLANT
GLOVE EXAM NITRILE MD LF STRL (GLOVE) ×3 IMPLANT
GLOVE INDICATOR 7.0 STRL GRN (GLOVE) ×6 IMPLANT
GOWN STRL REIN XL XLG (GOWN DISPOSABLE) ×9 IMPLANT
INST SET LAPROSCOPIC AP (KITS) ×3 IMPLANT
INST SET MAJOR GENERAL (KITS) ×3 IMPLANT
IV NS IRRIG 3000ML ARTHROMATIC (IV SOLUTION) IMPLANT
KIT ROOM TURNOVER AP CYSTO (KITS) IMPLANT
LIGASURE 5MM LAPAROSCOPIC (INSTRUMENTS) IMPLANT
LIGASURE LAP ATLAS 10MM 37CM (INSTRUMENTS) ×3 IMPLANT
MANIFOLD NEPTUNE II (INSTRUMENTS) ×3 IMPLANT
NEEDLE HYPO 18GX1.5 BLUNT FILL (NEEDLE) IMPLANT
NS IRRIG 1000ML POUR BTL (IV SOLUTION) ×6 IMPLANT
PACK LAP CHOLE LZT030E (CUSTOM PROCEDURE TRAY) ×3 IMPLANT
PAD ARMBOARD 7.5X6 YLW CONV (MISCELLANEOUS) ×3 IMPLANT
PENCIL HANDSWITCHING (ELECTRODE) ×3 IMPLANT
RELOAD LINEAR CUT PROX 55 BLUE (ENDOMECHANICALS) IMPLANT
RELOAD PROXIMATE 75MM BLUE (ENDOMECHANICALS) IMPLANT
RELOAD STAPLE TA45 3.5 REG BLU (ENDOMECHANICALS) IMPLANT
SEALER TISSUE G2 CVD JAW 35 (ENDOMECHANICALS) ×2 IMPLANT
SEALER TISSUE G2 CVD JAW 45CM (ENDOMECHANICALS) ×1
SET BASIN LINEN APH (SET/KITS/TRAYS/PACK) ×3 IMPLANT
SET TUBE IRRIG SUCTION NO TIP (IRRIGATION / IRRIGATOR) IMPLANT
SHEET LAVH (DRAPES) IMPLANT
SPONGE GAUZE 2X2 8PLY STRL LF (GAUZE/BANDAGES/DRESSINGS) ×6 IMPLANT
SPONGE GAUZE 4X4 12PLY (GAUZE/BANDAGES/DRESSINGS) ×3 IMPLANT
SPONGE LAP 18X18 X RAY DECT (DISPOSABLE) ×6 IMPLANT
STAPLER CIRC CVD 29MM 37CM (STAPLE) ×3 IMPLANT
STAPLER GUN LINEAR PROX 60 (STAPLE) ×3 IMPLANT
STAPLER PROXIMATE 55 BLUE (STAPLE) IMPLANT
STAPLER PROXIMATE 75MM BLUE (STAPLE) ×3 IMPLANT
STAPLER ROTICULATOR 4.8 (STAPLE) ×3 IMPLANT
STAPLER SYS INTERNAL RELOAD SS (MISCELLANEOUS) ×3 IMPLANT
STAPLER VISISTAT (STAPLE) ×3 IMPLANT
SUCTION POOLE TIP (SUCTIONS) ×3 IMPLANT
SUT CHROMIC 0 CT 1 (SUTURE) IMPLANT
SUT CHROMIC 2 0 SH (SUTURE) IMPLANT
SUT CHROMIC 3 0 SH 27 (SUTURE) IMPLANT
SUT PDS AB CT VIOLET #0 27IN (SUTURE) ×3 IMPLANT
SUT SILK 2 0 (SUTURE)
SUT SILK 2-0 18XBRD TIE 12 (SUTURE) IMPLANT
SUT SILK 3 0 SH CR/8 (SUTURE) ×3 IMPLANT
SUT VIC AB 0 CT1 27 (SUTURE) ×1
SUT VIC AB 0 CT1 27XCR 8 STRN (SUTURE) ×2 IMPLANT
SUT VIC AB 2-0 CT2 27 (SUTURE) IMPLANT
SYR BULB IRRIGATION 50ML (SYRINGE) ×3 IMPLANT
SYS LAPSCP GELPORT 120MM (MISCELLANEOUS) ×3
SYSTEM LAPSCP GELPORT 120MM (MISCELLANEOUS) ×2 IMPLANT
TAPE CLOTH SURG 4X10 WHT LF (GAUZE/BANDAGES/DRESSINGS) ×3 IMPLANT
TRAY FOLEY CATH 14FR (SET/KITS/TRAYS/PACK) ×3 IMPLANT
TROCAR Z-THAD FIOS HNDL 12X100 (TROCAR) ×3 IMPLANT
TROCAR Z-THRD FIOS HNDL 11X100 (TROCAR) ×3 IMPLANT
TROCAR Z-THREAD SLEEVE 11X100 (TROCAR) ×3 IMPLANT
TUBING HI FLO HEAT INSUFFLATOR (IRRIGATION / IRRIGATOR) ×3 IMPLANT
WARMER LAPAROSCOPE (MISCELLANEOUS) ×3 IMPLANT
YANKAUER SUCT BULB TIP 10FT TU (MISCELLANEOUS) ×3 IMPLANT

## 2012-05-11 NOTE — Interval H&P Note (Signed)
History and Physical Interval Note:  05/11/2012 7:29 AM  Debra [name]  has presented today for surgery, with the diagnosis of diverticulosis  The various methods of treatment have been discussed with the patient and family. After consideration of risks, benefits and other options for treatment, the patient has consented to  Procedure(s) (LRB): HAND ASSISTED LAPAROSCOPIC COLON RESECTION (N/A) as a surgical intervention .  The patients' history has been reviewed, patient examined, no change in status, stable for surgery.  I have reviewed the patients' chart and labs.  Questions were answered to the patient's satisfaction.     Franky Macho A

## 2012-05-11 NOTE — Anesthesia Preprocedure Evaluation (Signed)
Anesthesia Evaluation  Patient identified by MRN, date of birth, ID band Patient awake    Reviewed: Allergy & Precautions, H&P , NPO status , Patient's Chart, lab work & pertinent test results  History of Anesthesia Complications (+) PONV  Airway Mallampati: I      Dental  (+) Teeth Intact   Pulmonary neg pulmonary ROS,  breath sounds clear to auscultation        Cardiovascular hypertension, Pt. on medications Rhythm:Regular     Neuro/Psych PSYCHIATRIC DISORDERS Anxiety    GI/Hepatic hiatal hernia, GERD-  Medicated,  Endo/Other  Hypothyroidism   Renal/GU      Musculoskeletal   Abdominal   Peds  Hematology   Anesthesia Other Findings   Reproductive/Obstetrics                           Anesthesia Physical Anesthesia Plan  ASA: II  Anesthesia Plan: General   Post-op Pain Management:    Induction: Intravenous, Rapid sequence and Cricoid pressure planned  Airway Management Planned: Oral ETT  Additional Equipment:   Intra-op Plan:   Post-operative Plan: Extubation in OR  Informed Consent: I have reviewed the patients History and Physical, chart, labs and discussed the procedure including the risks, benefits and alternatives for the proposed anesthesia with the patient or authorized representative who has indicated his/her understanding and acceptance.     Plan Discussed with:   Anesthesia Plan Comments:         Anesthesia Quick Evaluation

## 2012-05-11 NOTE — Anesthesia Postprocedure Evaluation (Signed)
  Anesthesia Post-op Note  Patient: Debra [name]  Procedure(s) Performed: Procedure(s) (LRB): HAND ASSISTED LAPAROSCOPIC COLON RESECTION (N/A)  Patient Location: PACU  Anesthesia Type: General  Level of Consciousness: awake, alert , oriented and patient cooperative  Airway and Oxygen Therapy: Patient Spontanous Breathing and Patient connected to face mask oxygen  Post-op Pain: 3 /10, mild  Post-op Assessment: Post-op Vital signs reviewed, Patient's Cardiovascular Status Stable, Respiratory Function Stable, Patent Airway, No signs of Nausea or vomiting and Pain level controlled  Post-op Vital Signs: Reviewed and stable  Complications: No apparent anesthesia complications

## 2012-05-11 NOTE — Transfer of Care (Signed)
Immediate Anesthesia Transfer of Care Note  Patient: Debra [name]  Procedure(s) Performed: Procedure(s) (LRB): HAND ASSISTED LAPAROSCOPIC COLON RESECTION (N/A)  Patient Location: PACU  Anesthesia Type: General  Level of Consciousness: awake and patient cooperative  Airway & Oxygen Therapy: Patient Spontanous Breathing and Patient connected to face mask oxygen  Post-op Assessment: Report given to PACU RN, Post -op Vital signs reviewed and stable and Patient moving all extremities  Post vital signs: Reviewed and stable  Complications: No apparent anesthesia complications

## 2012-05-11 NOTE — Anesthesia Procedure Notes (Signed)
Procedure Name: Intubation Date/Time: 05/11/2012 9:02 AM Performed by: Despina Hidden Pre-anesthesia Checklist: Emergency Drugs available, Patient being monitored, Patient identified and Suction available Patient Re-evaluated:Patient Re-evaluated prior to inductionOxygen Delivery Method: Circle system utilized Preoxygenation: Pre-oxygenation with 100% oxygen Intubation Type: IV induction, Cricoid Pressure applied and Rapid sequence Ventilation: Mask ventilation without difficulty Laryngoscope Size: Mac and 3 Grade View: Grade I Tube type: Oral Tube size: 7.0 mm Number of attempts: 1 Airway Equipment and Method: Stylet Placement Confirmation: ETT inserted through vocal cords under direct vision,  breath sounds checked- equal and bilateral and positive ETCO2 Secured at: 21 cm Tube secured with: Tape Dental Injury: Teeth and Oropharynx as per pre-operative assessment

## 2012-05-11 NOTE — Op Note (Signed)
Patient:  Debra [name]  DOB:  05-01-1951  MRN:  [id number]   Preop Diagnosis:  Recurrent sigmoid diverticulitis, diverticulosis  Postop Diagnosis:  Same  Procedure:  Laparoscopic hand-assisted low anterior resection  Surgeon:  Franky Macho, M.D.  Anes:  General endotracheal  Indications:  Patient is a 61 year old white female presents with recurrent episodes of sigmoid diverticulitis. She is currently asymptomatic he now presents for elective partial colectomy. The risks and benefits of the procedure including bleeding, infection, cardiopulmonary difficulties, and the possibility of a blood transfusion were fully explained to the patient, gave informed consent.  Procedure note:  Patient was placed in the low lithotomy position after induction of general endotracheal anesthesia. The abdomen and perineum were prepped and draped using usual sterile technique with DuraPrep and Betadine. Surgical site confirmation was performed.  A low midline incision was made down to the peritoneum. The peritoneal cavity was entered into without difficulty. A GelPort was then inserted. An additional 11 mm trocar was placed left lower quadrant region and another one placed in the right flank region. The abdomen was then insufflated to 16 mm mercury pressure. The descending colon was mobilized along its peritoneal reflection using Endo Shears. The splenic flexure was taken down using the LigaSure. The sigmoid colon was taken down from its peritoneal reflection, carefully avoiding the left ureter which was identified. Once the descending colon sigmoid colon regions were mobilized, inspection of the distal sigmoid colon revealed that this was the area of recurrent diverticulitis. With the GelPort removed, I proceeded to perform the low anterior resection. A GIA 70 stapler was placed across the proximal sigmoid colon and fired. An articulating TA 60 stapler was placed at the colorectal juncture and fired. The specimen  was removed from the operative field and sent to pathology further examination. The anastomosis was then performed transanally using a #29 EEA circular stapler. An auto pursestring or was placed on the sigmoid colon and the top portion of the stapler was inserted into the proximal colon. The base of the EEA stapler was then placed transanally up to the colorectal staple line. The ends were brought together and the stapler was fired. Upon removal of the circular stapler, 2 well formed donuts of tissue were noted. The staple line was then oversewn laterally and anteriorly using 3-0 silk sutures. Air was then instilled transanally and the anastomosis was noted to be intact without the. The pelvis was then copiously irrigated normal saline. Operating personnel changed their gloves. Surgicel and Gelfoam were then placed into the pelvis posterior to the anastomosis. All fluid and tapes were then removed from the abdomen.  The perineum along the midline incision was closed using an 0 chromic gut running suture. The fascia was reapproximated using an 0 PDS running suture. All skin incisions were closed using staples after being cleansed with normal saline. Betadine ointment after dressings were applied.  All tape and needle counts were correct at the end of the procedure. The patient was extubated in the operating room and went back to recovery room awake in stable condition.  Complications:  None  EBL:  100 cc  Specimen:  Sigmoid colon, opened end distal

## 2012-05-12 LAB — BASIC METABOLIC PANEL
CO2: 25 mEq/L (ref 19–32)
Chloride: 101 mEq/L (ref 96–112)
Creatinine, Ser: 0.71 mg/dL (ref 0.50–1.10)
GFR calc Af Amer: >90 mL/min (ref >90)
Potassium: 3.7 mEq/L (ref 3.5–5.1)
Sodium: 135 mEq/L (ref 135–145)

## 2012-05-12 LAB — CBC
HCT: 30.6 % — ABNORMAL LOW (ref 36.0–46.0)
Hemoglobin: 10.3 g/dL — ABNORMAL LOW (ref 12.0–15.0)
MCV: 82.7 fL (ref 78.0–100.0)
RBC: 3.7 MIL/uL — ABNORMAL LOW (ref 3.87–5.11)
WBC: 11.1 10*3/uL — ABNORMAL HIGH (ref 4.0–10.5)

## 2012-05-12 LAB — MAGNESIUM: Magnesium: 1.4 mg/dL — ABNORMAL LOW (ref 1.5–2.5)

## 2012-05-12 MED ORDER — SODIUM CHLORIDE 0.9 % IJ SOLN
INTRAMUSCULAR | Status: AC
  Administered 2012-05-12: 10 mL
  Filled 2012-05-12: qty 3

## 2012-05-12 MED ORDER — MAGNESIUM SULFATE 40 MG/ML IJ SOLN
2.0000 g | Freq: Once | INTRAMUSCULAR | Status: AC
  Administered 2012-05-12: 2 g via INTRAVENOUS
  Filled 2012-05-12: qty 50

## 2012-05-12 MED ORDER — ACETAMINOPHEN 10 MG/ML IV SOLN
1000.0000 mg | Freq: Four times a day (QID) | INTRAVENOUS | Status: AC
  Administered 2012-05-12 – 2012-05-13 (×4): 1000 mg via INTRAVENOUS
  Filled 2012-05-12 (×4): qty 100

## 2012-05-12 MED ORDER — SIMETHICONE 80 MG PO CHEW
80.0000 mg | CHEWABLE_TABLET | ORAL | Status: DC | PRN
  Administered 2012-05-12 – 2012-05-13 (×3): 80 mg via ORAL
  Filled 2012-05-12 (×3): qty 1

## 2012-05-12 MED ORDER — KCL IN DEXTROSE-NACL 20-5-0.45 MEQ/L-%-% IV SOLN
INTRAVENOUS | Status: DC
  Administered 2012-05-12: 09:00:00 via INTRAVENOUS

## 2012-05-12 MED ORDER — PROPRANOLOL HCL ER 80 MG PO CP24
80.0000 mg | ORAL_CAPSULE | Freq: Every day | ORAL | Status: DC
  Administered 2012-05-12 – 2012-05-13 (×2): 80 mg via ORAL
  Filled 2012-05-12 (×5): qty 1

## 2012-05-12 NOTE — Anesthesia Postprocedure Evaluation (Signed)
  Anesthesia Post-op Note  Patient: Debra [name]  Procedure(s) Performed: Procedure(s) (LRB): HAND ASSISTED LAPAROSCOPIC COLON RESECTION (N/A) LOW ANTERIOR BOWEL RESECTION ()  Patient Location: room 302  Anesthesia Type: General  Level of Consciousness: awake, alert , oriented and patient cooperative  Airway and Oxygen Therapy: Patient Spontanous Breathing  Post-op Pain: 3 /10, mild  Post-op Assessment: Post-op Vital signs reviewed, Patient's Cardiovascular Status Stable, Respiratory Function Stable, Patent Airway, No signs of Nausea or vomiting and Pain level controlled  Post-op Vital Signs: Reviewed and stable  Complications: No apparent anesthesia complications

## 2012-05-12 NOTE — Progress Notes (Signed)
UR Chart Review Completed  

## 2012-05-12 NOTE — Addendum Note (Signed)
Addendum  created 05/12/12 1453 by Despina Hidden, CRNA   Modules edited:Notes Section

## 2012-05-12 NOTE — Care Management Note (Signed)
    Page 1 of 1   05/13/2012     2:10:52 PM   CARE MANAGEMENT NOTE 05/13/2012  Patient:  Debra [name], Debra [name]   Account Number:  [id number]  Date Initiated:  05/12/2012  Documentation initiated by:  Anibal Henderson  Subjective/Objective Assessment:   Pt admitted with diverticulitis, and colon resection, no colostomy. She is from home with spouse, is independent, and will return home. Denies needs     Action/Plan:   will follow for CM needs   Anticipated DC Date:  05/13/2012   Anticipated DC Plan:  HOME/SELF CARE      DC Planning Services  CM consult      Choice offered to / List presented to:             Status of service:  Completed, signed off Medicare Important Message given?   (If response is "NO", the following Medicare IM given date fields will be blank) Date Medicare IM given:   Date Additional Medicare IM given:    Discharge Disposition:  HOME/SELF CARE  Per UR Regulation:  Reviewed for med. necessity/level of care/duration of stay  If discussed at Long Length of Stay Meetings, dates discussed:    Comments:  05/12/12 1500 Anibal Henderson RN

## 2012-05-12 NOTE — Progress Notes (Signed)
1 Day Post-Op  Subjective: Feels [name] little bloated, otherwise doing well. Did have [name] small bowel movement yesterday. Pain under adequate control.  Objective: Vital signs in last 24 hours: Temp:  [97.6 F (36.4 C)-98.5 F (36.9 C)] 98.5 F (36.9 C) (06/06 0429) Pulse Rate:  [51-70] 69  (06/06 0429) Resp:  [11-29] 18  (06/06 0429) BP: (99-146)/(65-89) 124/68 mmHg (06/06 0429) SpO2:  [91 %-100 %] 94 % (06/06 0429)    Intake/Output from previous day: 06/05 0701 - 06/06 0700 In: 4421.3 [P.O.:240; I.V.:4031.3; IV Piggyback:150] Out: 840 [Urine:740; Blood:100] Intake/Output this shift:    General appearance: alert, cooperative and no distress Resp: clear to auscultation bilaterally Cardio: regular rate and rhythm, S1, S2 normal, no murmur, click, rub or gallop GI: Soft. Dressings dry and intact.  Lab Results:   [facility] 05/12/12 0535  WBC 11.1*  HGB 10.3*  HCT 30.6*  PLT 228   BMET  Basename 05/12/12 0535  NA 135  K 3.7  CL 101  CO2 25  GLUCOSE 118*  BUN 11  CREATININE 0.71  CALCIUM 8.6   PT/INR No results found for this basename: LABPROT:2,INR:2 in the last 72 hours  Studies/Results: No results found.  Anti-infectives: Anti-infectives     Start     Dose/Rate Route Frequency Ordered Stop   05/11/12 0741   sodium chloride 0.9 % with ertapenem [facility]) ADS Med     Comments: Devoria Glassing: cabinet override         05/11/12 0741 05/11/12 1944   05/11/12 0723   ertapenem (INVANZ) 1 g in sodium chloride 0.9 % 50 mL IVPB  Status:  Discontinued        1 g 100 mL/hr over 30 Minutes Intravenous 60 min pre-op 05/11/12 0723 05/11/12 1237          Assessment/Plan: s/p Procedure(s): HAND ASSISTED LAPAROSCOPIC COLON RESECTION LOW ANTERIOR BOWEL RESECTION Impression: Stable on postoperative day one. Wants to ambulate in the hallway. We'll advance to full liquid diet. Will supplement magnesium as she does have mild hypomagnesemia. Will adjust IV fluids. We'll remove  Foley.  LOS: 1 day    Debra [name] 05/12/2012

## 2012-05-13 ENCOUNTER — Encounter (HOSPITAL_COMMUNITY): Payer: Self-pay | Admitting: General Surgery

## 2012-05-13 LAB — MAGNESIUM: Magnesium: 1.7 mg/dL (ref 1.5–2.5)

## 2012-05-13 LAB — BASIC METABOLIC PANEL
Calcium: 8.8 mg/dL (ref 8.4–10.5)
Chloride: 98 mEq/L (ref 96–112)
Creatinine, Ser: 0.6 mg/dL (ref 0.50–1.10)
GFR calc Af Amer: >90 mL/min (ref >90)
Sodium: 131 mEq/L — ABNORMAL LOW (ref 135–145)

## 2012-05-13 LAB — CBC
HCT: 29.8 % — ABNORMAL LOW (ref 36.0–46.0)
Platelets: 229 10*3/uL (ref 150–400)
RBC: 3.62 MIL/uL — ABNORMAL LOW (ref 3.87–5.11)
RDW: 16.5 % — ABNORMAL HIGH (ref 11.5–15.5)
WBC: 8.5 10*3/uL (ref 4.0–10.5)

## 2012-05-13 MED ORDER — DEXTROSE 5 % IV SOLN
15.0000 mmol | Freq: Once | INTRAVENOUS | Status: AC
  Administered 2012-05-13: 15 mmol via INTRAVENOUS
  Filled 2012-05-13: qty 5

## 2012-05-13 MED ORDER — SODIUM CHLORIDE 0.9 % IJ SOLN
INTRAMUSCULAR | Status: AC
  Administered 2012-05-13: 10 mL
  Filled 2012-05-13: qty 3

## 2012-05-13 MED FILL — Bupivacaine HCl Preservative Free (PF) Inj 0.5%: INTRAMUSCULAR | Qty: 30 | Status: AC

## 2012-05-13 NOTE — Consult Note (Signed)
MEDICATION RELATED CONSULT NOTE - INITIAL   Pharmacy Consult for phosphorous replacement Indication: hypophosphatemia  Allergies  Allergen Reactions  . Codeine Nausea And Vomiting  . Morphine And Related Nausea And Vomiting  . Sulfa Antibiotics Hives and Itching   Patient Measurements:   Last recorded weight = 82Kg  Vital Signs: Temp: 98.8 F (37.1 C) (06/07 0406) Temp src: Oral (06/07 0406) BP: 157/76 mmHg (06/07 0406) Pulse Rate: 66  (06/07 0406) Intake/Output from previous day: 06/06 0701 - 06/07 0700 In: 3195.8 [P.O.:560; I.V.:2485.8; IV Piggyback:150] Out: 1825 [Urine:1825] Intake/Output from this shift:    Labs:  [facility] 05/13/12 0511 05/12/12 0535  WBC 8.5 11.1*  HGB 10.0* 10.3*  HCT 29.8* 30.6*  PLT 229 228  APTT -- --  CREATININE 0.60 0.71  LABCREA -- --  CREATININE 0.60 0.71  CREAT24HRUR -- --  MG 1.7 1.4*  PHOS 1.8* 3.0  ALBUMIN -- --  PROT -- --  ALBUMIN -- --  AST -- --  ALT -- --  ALKPHOS -- --  BILITOT -- --  BILIDIR -- --  IBILI -- --   The CrCl is unknown because both a height and weight (above a minimum accepted value) are required for this calculation.  Microbiology: Recent Results (from the past 720 hour(s))  SURGICAL PCR SCREEN     Status: Abnormal   Collection Time   05/06/12  3:52 PM      Component Value Range Status Comment   MRSA, PCR NEGATIVE  NEGATIVE  Final    Staphylococcus aureus POSITIVE (*) NEGATIVE  Final    Medical History: Past Medical History  Diagnosis Date  . Unspecified hypothyroidism   . Generalized anxiety disorder   . Mixed hyperlipidemia   . Esophageal reflux   . Diverticulosis of colon (without mention of hemorrhage)   . Hiatal hernia   . PONV (postoperative nausea and vomiting)   . Iron deficiency anemia 01/19/2012  . Diverticulitis    Medications:  Scheduled:    . acetaminophen  1,000 mg Intravenous Q6H  . alvimopan  12 mg Oral BID  . enoxaparin  40 mg Subcutaneous Q24H  . levothyroxine  100  mcg Oral QAC breakfast  . magnesium sulfate 1 - 4 g bolus IVPB  2 g Intravenous Once  . pantoprazole  40 mg Oral Q1200  . propranolol ER  80 mg Oral Q breakfast  . sodium chloride      . sodium phosphate  Dextrose 5% IVPB  15 mmol Intravenous Once   Assessment: Hypophosphatemia Hyponatremia noted  Goal of Therapy:  Replenish PO4 (use Na Phos due to low Na)  Plan:  Sodium Phosphates today x 1 over 6 hrs F/U labs tomorrow  Valrie Hart A 05/13/2012,8:27 AM

## 2012-05-13 NOTE — Progress Notes (Signed)
Discharge summary: a/o. Vss. Discharge instructions given. Pt verbalized understanding of instructions. Awaiting for IV medication to be completed prior to discharge. Spouse at bedside.

## 2012-05-13 NOTE — Discharge Instructions (Signed)
Removal of the Colon (Colectomy) Care After AFTER THE PROCEDURE After surgery, you will be taken to the recovery area where a nurse will watch you and check your progress. After the recovery area you will go to your hospital room. Your surgeon will determine when it is alright for you to take fluids and foods. You will be given pain medications to keep you comfortable. HOME CARE INSTRUCTIONS  Once home, an ice pack applied to the operative site may help with discomfort and keep swelling down.   Change dressings as directed.   Only take over-the-counter or prescription medicines for pain, discomfort, or fever as directed by your caregiver.   You may continue normal diet and activities as directed.   There should be no heavy lifting (more than 10 pounds), strenuous activities or contact sports for three weeks, or as directed.   Keep the wound dry and clean. The wound may be washed gently with soap and water. Gently blot or dab dry following cleansing, without rubbing. Do not take baths, use swimming pools, or use hot tubs for ten days, or as instructed by your caregivers.   If you have a colostomy, care for it as you have been shown.  SEEK MEDICAL CARE IF:   There is redness, swelling, or increasing pain in the wound area.   Pus is coming from the wound.   An unexplained oral temperature above 102 F (38.9 C) develops.   You notice a foul smell coming from the wound or dressing.   There is a breaking open of a wound (edges not staying together) after the sutures have been removed.   There is increasing abdominal pain.  SEEK IMMEDIATE MEDICAL CARE IF:   A rash develops.   There is difficulty breathing, or development of a reaction or side effects to medications given.  Document Released: 06/23/2004 Document Revised: 11/12/2011 Document Reviewed: 12/27/2007 Exit[employer] Patient Information 2012 [employer].    

## 2012-05-13 NOTE — Discharge Summary (Signed)
Physician Discharge Summary  Patient ID: Debra [name] MRN: [id number] DOB/AGE: [birth year] 61 y.o.  Admit date: 05/11/2012 Discharge date: 05/13/2012  Admission Diagnoses: Diverticulosis, recurrent sigmoid diverticulitis  Discharge Diagnoses: Same Active Problems:  * No active hospital problems. *    Discharged Condition: good  Hospital Course: Patient is a 61 year old white female with a history of recurrent sigmoid diverticulitis who presents for elective laparoscopic hand-assisted partial colectomy of the sigmoid colon. She underwent this procedure on 05/11/2012. She tolerated the procedure well. Postoperative course has been for the most part unremarkable except for a low phosphorus level which has been treated. Her diet was advanced without difficulty. She is being discharged home on postoperative day 2 in good improving condition.  Treatments: surgery: Laparoscopic hand-assisted low anterior resection on 05/11/2012  Discharge Exam: Blood pressure 157/76, pulse 66, temperature 98.8 F (37.1 C), temperature source Oral, resp. rate 18, SpO2 92.00%. General appearance: alert, cooperative and no distress Resp: clear to auscultation bilaterally Cardio: regular rate and rhythm, S1, S2 normal, no murmur, click, rub or gallop GI: Soft, flat. Good bowel sounds heard. Incisions healing well.  Disposition: 06-Home-Health Care Svc  Discharge Orders    Future Appointments: Provider: Department: Dept Phone: Center:   05/24/2012 12:00 PM Randall An, MD [facility] [phone] None     Medication List  As of 05/13/2012  9:05 AM   TAKE these medications         cholecalciferol 1000 UNITS tablet   Commonly known as: VITAMIN D   Take 1,000 Units by mouth daily.      CITRACAL PO   Take 1 tablet by mouth daily.      ferrous sulfate 325 (65 FE) MG tablet   Take 325 mg by mouth 2 (two) times daily before a meal.      folic acid 400 MCG tablet   Commonly known as: FOLVITE   Take  400 mcg by mouth daily.      levothyroxine 100 MCG tablet   Commonly known as: SYNTHROID, LEVOTHROID   Take 100 mcg by mouth daily.      LORazepam 0.5 MG tablet   Commonly known as: ATIVAN   Take 0.25-0.5 mg by mouth at bedtime.      multivitamin with minerals Tabs   Take 1 tablet by mouth daily.      pravastatin 10 MG tablet   Commonly known as: PRAVACHOL   Take 10 mg by mouth daily.      propranolol 80 MG 24 hr capsule   Commonly known as: INNOPRAN XL   Take 80 mg by mouth at bedtime.      ranitidine 300 MG tablet   Commonly known as: ZANTAC   Take 300 mg by mouth at bedtime.      vitamin C 1000 MG tablet   Take 1,000 mg by mouth daily.           Follow-up Information    Follow up with Dalia Heading, MD. Schedule an appointment as soon as possible for a visit on 05/24/2012.   Contact information:   [address] [place] [address] [phone]          Signed: Dalia Heading 05/13/2012, 9:05 AM

## 2012-05-16 LAB — TYPE AND SCREEN
ABO/RH(D): O NEG
Antibody Screen: NEGATIVE
Unit division: 0

## 2012-05-24 ENCOUNTER — Ambulatory Visit (HOSPITAL_COMMUNITY): Payer: BC Managed Care – PPO | Admitting: Oncology

## 2012-08-26 ENCOUNTER — Other Ambulatory Visit (HOSPITAL_COMMUNITY): Payer: Self-pay | Admitting: Pediatrics

## 2012-08-26 DIAGNOSIS — Z139 Encounter for screening, unspecified: Secondary | ICD-10-CM

## 2012-09-26 ENCOUNTER — Ambulatory Visit (HOSPITAL_COMMUNITY)
Admission: RE | Admit: 2012-09-26 | Discharge: 2012-09-26 | Disposition: A | Discharge status: Home / Self Care | Payer: BC Managed Care – PPO | Source: Ambulatory Visit | Attending: Pediatrics | Admitting: Pediatrics

## 2012-09-26 DIAGNOSIS — Z1231 Encounter for screening mammogram for malignant neoplasm of breast: Secondary | ICD-10-CM | POA: Insufficient documentation

## 2012-09-26 DIAGNOSIS — Z139 Encounter for screening, unspecified: Secondary | ICD-10-CM

## 2012-10-27 ENCOUNTER — Encounter (INDEPENDENT_AMBULATORY_CARE_PROVIDER_SITE_OTHER): Payer: Self-pay | Admitting: Internal Medicine

## 2012-10-27 ENCOUNTER — Ambulatory Visit (INDEPENDENT_AMBULATORY_CARE_PROVIDER_SITE_OTHER): Payer: BC Managed Care – PPO | Admitting: Internal Medicine

## 2012-10-27 VITALS — BP 90/58 | HR 60 | Temp 98.2°F | Ht 65.0 in | Wt 177.8 lb

## 2012-10-27 DIAGNOSIS — K589 Irritable bowel syndrome without diarrhea: Secondary | ICD-10-CM

## 2012-10-27 NOTE — Patient Instructions (Addendum)
Flatus diet.  Increase fiber.

## 2012-10-27 NOTE — Progress Notes (Signed)
Subjective:     Patient ID: Debra [name], female   DOB: 24-Nov-1951, 61 y.o.   MRN: [id number]  HPI Hx of multiple bouts of diverticulitis. Underwent an anterior resection in June of this year for recurrent sigmoid diverticulitis. She tells me she has constant diarrhea and flatus. Monday she ate an apple and felt bloated.  She is trying to eliminate wheat from her diet. She has had these symptoms for 3 weeks. She has bouts of incontinence.  No recent antibiotics. She is having about 4 stools a day. Does not want a celiac panel. Appetite is good. No weight loss.  No rectal bleeding.  Review of Systems see hpi Current Outpatient Prescriptions  Medication Sig Dispense Refill  . Iron-FA-B Cmp-C-Biot-Probiotic (FUSION PLUS PO) Take by mouth.      . levothyroxine (SYNTHROID, LEVOTHROID) 100 MCG tablet Take 100 mcg by mouth daily.        Marland Kitchen LORazepam (ATIVAN) 0.5 MG tablet Take 0.25-0.5 mg by mouth at bedtime.       . Multiple Vitamin (MULITIVITAMIN WITH MINERALS) TABS Take 1 tablet by mouth daily.      . pravastatin (PRAVACHOL) 10 MG tablet Take 10 mg by mouth daily.        . propranolol (INNOPRAN XL) 80 MG 24 hr capsule Take 80 mg by mouth at bedtime.      . ranitidine (ZANTAC) 300 MG tablet Take 300 mg by mouth at bedtime.      . [DISCONTINUED] Fluticasone-Salmeterol (ADVAIR) 100-50 MCG/DOSE AEPB Inhale 1 puff into the lungs every 12 (twelve) hours.            Objective:   Physical Exam Filed Vitals:   10/27/12 1608  BP: 90/58  Pulse: 60  Temp: 98.2 F (36.8 C)  Height: 5\' 5"  (1.651 m)  Weight: 177 lb 12.8 oz (80.65 kg)   Alert and oriented. Skin warm and dry. Oral mucosa is moist.   . Sclera anicteric, conjunctivae is pink. Thyroid not enlarged. No cervical lymphadenopathy. Lungs clear. Heart regular rate and rhythm.  Abdomen is soft. Bowel sounds are positive. No hepatomegaly. No abdominal masses felt. No tenderness.  No edema to lower extremities. Patient is alert and oriented.    Assessment:    Bloating, diarrhea.  Suspect she has component of IBS.  No recent antibiotics.    Plan:    Increase fiber in diet. Imodium BID.   PR Monday.

## 2013-02-15 ENCOUNTER — Ambulatory Visit (INDEPENDENT_AMBULATORY_CARE_PROVIDER_SITE_OTHER): Payer: BC Managed Care – PPO | Admitting: Internal Medicine

## 2013-02-15 ENCOUNTER — Encounter (INDEPENDENT_AMBULATORY_CARE_PROVIDER_SITE_OTHER): Payer: Self-pay | Admitting: Internal Medicine

## 2013-02-15 VITALS — BP 138/68 | HR 60 | Temp 98.2°F | Ht 65.0 in | Wt 181.1 lb

## 2013-02-15 DIAGNOSIS — R140 Abdominal distension (gaseous): Secondary | ICD-10-CM | POA: Insufficient documentation

## 2013-02-15 DIAGNOSIS — R143 Flatulence: Secondary | ICD-10-CM | POA: Insufficient documentation

## 2013-02-15 DIAGNOSIS — K219 Gastro-esophageal reflux disease without esophagitis: Secondary | ICD-10-CM

## 2013-02-15 DIAGNOSIS — R141 Gas pain: Secondary | ICD-10-CM

## 2013-02-15 MED ORDER — OMEPRAZOLE 20 MG PO CPDR: 40.0000 mg | DELAYED_RELEASE_CAPSULE | Freq: Every day | ORAL | Status: DC

## 2013-02-15 MED ORDER — DICYCLOMINE HCL 10 MG PO CAPS: 10.0000 mg | ORAL_CAPSULE | Freq: Two times a day (BID) | ORAL | Status: DC

## 2013-02-15 NOTE — Patient Instructions (Addendum)
Rx for Dicyclomine BID. Stop the Zantac. Start Protonix 40mg  daily

## 2013-02-15 NOTE — Progress Notes (Signed)
Subjective:     Patient ID: Debra [name], female   DOB: 1951-06-17, 62 y.o.   MRN: [id number]  HPI Presents today with c/o gas. She has frequent flatus. She says she is taking 8 gas pills a day. She says she has abdominal pain. She is bloated.  She says she feels miserable. She has had symptoms X 2 weeks.  Better now.  Denies any fever.  No change in her BMs.  No fever.   She is not drinking any sodas. No beans.  Appetite is good. No weight loss. Colon resection in 05/2012 for recurrent diverticulitis. Review of Systems Current Outpatient Prescriptions  Medication Sig Dispense Refill  . Iron-FA-B Cmp-C-Biot-Probiotic (FUSION PLUS PO) Take by mouth.      . levothyroxine (SYNTHROID, LEVOTHROID) 100 MCG tablet Take 100 mcg by mouth daily.        Marland Kitchen LORazepam (ATIVAN) 0.5 MG tablet Take 0.25-0.5 mg by mouth at bedtime.       . Multiple Vitamin (MULITIVITAMIN WITH MINERALS) TABS Take 1 tablet by mouth daily.      . pravastatin (PRAVACHOL) 10 MG tablet Take 10 mg by mouth daily.        . propranolol (INNOPRAN XL) 80 MG 24 hr capsule Take 80 mg by mouth at bedtime.      . ranitidine (ZANTAC) 300 MG tablet Take 300 mg by mouth at bedtime.      . dicyclomine (BENTYL) 10 MG capsule Take 1 capsule (10 mg total) by mouth 2 (two) times daily before a meal.  60 capsule  1  . omeprazole (PRILOSEC) 20 MG capsule Take 2 capsules (40 mg total) by mouth daily.  30 capsule  3  . [DISCONTINUED] Fluticasone-Salmeterol (ADVAIR) 100-50 MCG/DOSE AEPB Inhale 1 puff into the lungs every 12 (twelve) hours.       No current facility-administered medications for this visit.   Past Medical History  Diagnosis Date  . Unspecified hypothyroidism   . Generalized anxiety disorder   . Mixed hyperlipidemia   . Esophageal reflux   . Diverticulosis of colon (without mention of hemorrhage)   . Hiatal hernia   . PONV (postoperative nausea and vomiting)   . Iron deficiency anemia 01/19/2012  . Diverticulitis    Past  Surgical History  Procedure Laterality Date  . Colonoscopy  2011-2008    Dr. Lovell Sheehan  . Cesarean section      First one 1978 , second on 1984  . Hysterctomy  1988  . Abdominal hysterectomy    . Tonsillectomy    . Appendectomy    . Colonoscopy  03/10/2012    Procedure: COLONOSCOPY;  Surgeon: Malissa Hippo, MD;  Location: [facility];  Service: Endoscopy;  Laterality: N/A;  200  . Colon resection  05/11/2012    Procedure: HAND ASSISTED LAPAROSCOPIC COLON RESECTION;  Surgeon: Dalia Heading, MD;  Location: [facility];  Service: General;  Laterality: N/A;  Laparoscopic Hand Assisted Partial Colectomy;  . Bowel resection  05/11/2012    Procedure: LOW ANTERIOR BOWEL RESECTION;  Surgeon: Dalia Heading, MD;  Location: [facility];  Service: General;;   Allergies  Allergen Reactions  . Codeine Nausea And Vomiting  . Morphine And Related Nausea And Vomiting  . Sulfa Antibiotics Hives and Itching       Objective:   Physical Exam  Filed Vitals:   02/15/13 1513  BP: 138/68  Pulse: 60  Temp: 98.2 F (36.8 C)  Height: 5\' 5"  (1.651 m)  Weight:  181 lb 1.6 oz (82.146 kg)        Assessment:    Bloating. Flatus x 2 weeks duration. Hx of hiatal hernia. Presently maintained on Zantac.    Plan:    Will try the Dicyclomine BID. Be sure not to get constipated. Rx for Protonix 40mg  BID. Stop the fiber bars.

## 2013-02-27 ENCOUNTER — Telehealth: Payer: Self-pay | Admitting: Family Medicine

## 2013-02-27 NOTE — Telephone Encounter (Signed)
Med called out 

## 2013-03-14 ENCOUNTER — Telehealth: Payer: Self-pay | Admitting: Family Medicine

## 2013-03-14 ENCOUNTER — Encounter: Payer: Self-pay | Admitting: Family Medicine

## 2013-03-14 NOTE — Telephone Encounter (Signed)
ntbs can have 60 more.

## 2013-03-14 NOTE — Telephone Encounter (Signed)
Med c/o and letter sent to pt to schedule ov

## 2013-03-14 NOTE — Telephone Encounter (Signed)
Ok to refill? LOV 07/14/12 as a new pt

## 2013-03-27 ENCOUNTER — Telehealth: Payer: Self-pay | Admitting: Family Medicine

## 2013-03-27 MED ORDER — LEVOTHYROXINE SODIUM 100 MCG PO TABS: 100.0000 ug | ORAL_TABLET | Freq: Every day | ORAL | Status: DC

## 2013-03-27 NOTE — Telephone Encounter (Signed)
LEVOTHYROXINE 100 MCG TAKE 1 TABLET BY MOUTH DAILY #30 LAST RF 03/13/13

## 2013-03-27 NOTE — Telephone Encounter (Signed)
Rx Refilled  

## 2013-04-03 ENCOUNTER — Encounter: Payer: Self-pay | Admitting: Family Medicine

## 2013-04-03 ENCOUNTER — Ambulatory Visit (INDEPENDENT_AMBULATORY_CARE_PROVIDER_SITE_OTHER): Payer: BC Managed Care – PPO | Admitting: Family Medicine

## 2013-04-03 VITALS — BP 122/74 | HR 60 | Temp 98.0°F | Resp 14 | Wt 186.0 lb

## 2013-04-03 DIAGNOSIS — G4700 Insomnia, unspecified: Secondary | ICD-10-CM

## 2013-04-03 MED ORDER — LORAZEPAM 0.5 MG PO TABS: 0.2500 mg | ORAL_TABLET | Freq: Every day | ORAL | Status: DC

## 2013-04-03 NOTE — Progress Notes (Signed)
Subjective:    Patient ID: Debra [name], female    DOB: 1951-11-26, 62 y.o.   MRN: [id number]  HPI  Patient is a very pleasant 62 year old female here for followup of her insomnia. 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 She deals with difficult students on a daily basis. This causes her great level of anxiety. She states is hard for her to turn off her mind at night. Furthermore she suffers from an element of posttraumatic stress disorder due to life-threatening incident that happened to her son about 5 years ago. She states at times because of this become overwhelming and she cannot sleep. Therefore she'll take lorazepam 0.5 mg tablet 2-3 times a week at night to help her sleep. She tried trazodone in the past but stated it made her feel overmedicated in the mornings. She also takes but appropriate for generalized anxiety and depression. However this medication has not helped with occasional intrusive thoughts at night and insomnia. Past Medical History  Diagnosis Date  . Unspecified hypothyroidism   . Generalized anxiety disorder   . Mixed hyperlipidemia   . Esophageal reflux   . Diverticulosis of colon (without mention of hemorrhage)   . Hiatal hernia   . PONV (postoperative nausea and vomiting)   . Iron deficiency anemia 01/19/2012  . Diverticulitis    Current Outpatient Prescriptions on File Prior to Visit  Medication Sig Dispense Refill  . dicyclomine (BENTYL) 10 MG capsule Take 1 capsule (10 mg total) by mouth 2 (two) times daily before a meal.  60 capsule  1  . Iron-FA-B Cmp-C-Biot-Probiotic (FUSION PLUS PO) Take 1 tablet by mouth daily.      Marland Kitchen levothyroxine (SYNTHROID, LEVOTHROID) 100 MCG tablet Take 1 tablet (100 mcg total) by mouth daily.  30 tablet  5  . Multiple Vitamin (MULITIVITAMIN WITH MINERALS) TABS Take 1 tablet by mouth daily.      Marland Kitchen omeprazole (PRILOSEC) 20 MG capsule Take 2 capsules (40 mg total) by mouth daily.  30 capsule  3  . pravastatin (PRAVACHOL) 10  MG tablet Take 10 mg by mouth daily.        . propranolol (INNOPRAN XL) 80 MG 24 hr capsule Take 80 mg by mouth at bedtime.      . [DISCONTINUED] Fluticasone-Salmeterol (ADVAIR) 100-50 MCG/DOSE AEPB Inhale 1 puff into the lungs every 12 (twelve) hours.       No current facility-administered medications on file prior to visit.   Allergies  Allergen Reactions  . Codeine Nausea And Vomiting  . Morphine And Related Nausea And Vomiting  . Sulfa Antibiotics Hives and Itching   History   Social History  . Marital Status: Married    Spouse Name: N/A    Number of Children: N/A  . Years of Education: N/A   Occupational History  . Not on file.   Social History Main Topics  . Smoking status: Never Smoker   . Smokeless tobacco: Never Used  . Alcohol Use: No  . Drug Use: No  . Sexually Active: Yes    Birth Control/ Protection: Surgical   Other Topics Concern  . Not on file   Social History Narrative  . No narrative on file     Review of Systems    remainder of review of systems is negative Objective:   Physical Exam  Constitutional: She is oriented to person, place, and time. She appears well-developed and well-nourished.  Cardiovascular: Normal rate, regular rhythm, normal heart sounds and intact distal pulses.  Exam reveals no gallop and no friction rub.   No murmur heard. Pulmonary/Chest: Effort normal and breath sounds normal. No respiratory distress. She has no wheezes. She has no rales. She exhibits no tenderness.  Abdominal: Soft. Bowel sounds are normal. She exhibits no distension and no mass. There is no tenderness. There is no rebound and no guarding.  Neurological: She is alert and oriented to person, place, and time. She has normal reflexes. She displays normal reflexes. No cranial nerve deficit. She exhibits normal muscle tone. Coordination normal.  Skin: Skin is warm and dry. No rash noted. No erythema. No pallor.          Assessment & Plan:   Insomnia  Patient like to continue to use lorazepam 0.5 mg tablet by mouth each bedtime when necessary anxiety. She's only using it 2-3 times per week. I feel that this is appropriate. I gave her 60 tablets with one refill. Will followup in 6 months.

## 2013-04-12 ENCOUNTER — Telehealth: Payer: Self-pay | Admitting: Family Medicine

## 2013-04-12 MED ORDER — PRAVASTATIN SODIUM 10 MG PO TABS: 10.0000 mg | ORAL_TABLET | Freq: Every day | ORAL | Status: DC

## 2013-04-12 NOTE — Telephone Encounter (Signed)
Rx Refilled  

## 2013-04-20 ENCOUNTER — Telehealth (INDEPENDENT_AMBULATORY_CARE_PROVIDER_SITE_OTHER): Payer: Self-pay | Admitting: *Deleted

## 2013-04-20 ENCOUNTER — Encounter (INDEPENDENT_AMBULATORY_CARE_PROVIDER_SITE_OTHER): Payer: Self-pay | Admitting: *Deleted

## 2013-04-20 NOTE — Telephone Encounter (Signed)
Reba is typing a letter

## 2013-04-20 NOTE — Telephone Encounter (Signed)
Patient is going on a business trip and will have to share a bathroom with 3 other women, she has "explosive diarrhea" and when she gets the urge to go she only has a few seconds to get to the bathroom -- she states she is very uncomfortable sharing a restroom with 3 women she doesn't know good -- she would like a note stating her condition

## 2013-04-24 ENCOUNTER — Telehealth: Payer: Self-pay | Admitting: *Deleted

## 2013-04-25 MED ORDER — BUPROPION HCL ER (SR) 150 MG PO TB12: 150.0000 mg | ORAL_TABLET | Freq: Two times a day (BID) | ORAL | Status: DC

## 2013-04-25 NOTE — Telephone Encounter (Signed)
Medication refilled per protocol. 

## 2013-05-02 ENCOUNTER — Telehealth: Payer: Self-pay | Admitting: Family Medicine

## 2013-05-03 MED ORDER — MOMETASONE FUROATE 0.1 % EX CREA: TOPICAL_CREAM | CUTANEOUS | Status: DC

## 2013-05-03 NOTE — Telephone Encounter (Signed)
Rx Refilled  

## 2013-05-04 ENCOUNTER — Encounter: Payer: Self-pay | Admitting: Physician Assistant

## 2013-05-04 ENCOUNTER — Ambulatory Visit (INDEPENDENT_AMBULATORY_CARE_PROVIDER_SITE_OTHER): Payer: BC Managed Care – PPO | Admitting: Physician Assistant

## 2013-05-04 VITALS — BP 156/106 | HR 68 | Temp 97.6°F | Resp 18 | Ht 62.0 in | Wt 189.0 lb

## 2013-05-04 DIAGNOSIS — L255 Unspecified contact dermatitis due to plants, except food: Secondary | ICD-10-CM

## 2013-05-04 DIAGNOSIS — I10 Essential (primary) hypertension: Secondary | ICD-10-CM

## 2013-05-04 MED ORDER — METHYLPREDNISOLONE ACETATE 80 MG/ML IJ SUSP
80.0000 mg | Freq: Once | INTRAMUSCULAR | Status: AC
  Administered 2013-05-04: 80 mg via INTRAMUSCULAR

## 2013-05-04 MED ORDER — PREDNISONE 20 MG PO TABS: ORAL_TABLET | ORAL | Status: DC

## 2013-05-04 MED ORDER — BENAZEPRIL HCL 10 MG PO TABS: 10.0000 mg | ORAL_TABLET | Freq: Every day | ORAL | Status: DC

## 2013-05-04 NOTE — Progress Notes (Signed)
Patient ID: Debra [name] MRN: [id number], DOB: 02/05/1951, 62 y.o. Date of Encounter: 05/04/2013, 3:49 PM    Chief Complaint:  Chief Complaint  Patient presents with  . poison on arms     HPI: 62 y.o. year old female has gotten into some poison oak/poison ivey. Says she worked in yard Sun 04/30/13. Mon dev some rash but got a lot worse Tues 05/02/13. Has continued to spread since. Very itchy-rash on both wrists, up the arms, some on neck and face. Taking Benadryl and applying Calamyne with no relief.  Also, has been checking BP at home. Getting high readings-around 140/90.   Home Meds: See attached medication section for any medications that were entered at today's visit. The computer does not put those onto this list.The following list is a list of meds entered prior to today's visit.   Current Outpatient Prescriptions on File Prior to Visit  Medication Sig Dispense Refill  . buPROPion (WELLBUTRIN SR) 150 MG 12 hr tablet Take 1 tablet (150 mg total) by mouth 2 (two) times daily.  60 tablet  1  . cholecalciferol (VITAMIN D) 1000 UNITS tablet Take 1,000 Units by mouth daily.      Marland Kitchen dicyclomine (BENTYL) 10 MG capsule Take 1 capsule (10 mg total) by mouth 2 (two) times daily before a meal.  60 capsule  1  . Iron-FA-B Cmp-C-Biot-Probiotic (FUSION PLUS PO) Take 1 tablet by mouth daily.      Marland Kitchen levothyroxine (SYNTHROID, LEVOTHROID) 100 MCG tablet Take 1 tablet (100 mcg total) by mouth daily.  30 tablet  5  . LORazepam (ATIVAN) 0.5 MG tablet Take 0.5-1 tablets (0.25-0.5 mg total) by mouth at bedtime.  60 tablet  1  . Multiple Vitamin (MULITIVITAMIN WITH MINERALS) TABS Take 1 tablet by mouth daily.      Marland Kitchen omeprazole (PRILOSEC) 20 MG capsule Take 2 capsules (40 mg total) by mouth daily.  30 capsule  3  . pravastatin (PRAVACHOL) 10 MG tablet Take 1 tablet (10 mg total) by mouth daily.  90 tablet  1  . propranolol (INNOPRAN XL) 80 MG 24 hr capsule Take 80 mg by mouth at bedtime.      . mometasone  (ELOCON) 0.1 % cream Apply to area bid x 10 days  30 g  0  . [DISCONTINUED] Fluticasone-Salmeterol (ADVAIR) 100-50 MCG/DOSE AEPB Inhale 1 puff into the lungs every 12 (twelve) hours.       No current facility-administered medications on file prior to visit.    Allergies:  Allergies  Allergen Reactions  . Codeine Nausea And Vomiting  . Morphine And Related Nausea And Vomiting  . Sulfa Antibiotics Hives and Itching      Review of Systems: See HPI for pertinent ROS. All other ROS negative.    Physical Exam: Blood pressure 156/106, pulse 68, temperature 97.6 F (36.4 C), temperature source Oral, resp. rate 18, height 5\' 2"  (1.575 m), weight 189 lb (85.73 kg)., Body mass index is 34.56 kg/(m^2). General:  WNWD WF. Appears in no acute distress. Lungs: Clear bilaterally to auscultation without wheezes, rales, or rhonchi. Breathing is unlabored. Heart: Regular rhythm. No murmurs, rubs, or gallops. Msk:  Strength and tone normal for age. Skin: Worst area is Bilateral Wrists: Erythematous vessicles and papules, some in linear distribution. Splotchy areas of erythema on neck, forehead. Remainder of skin normal.   Neuro: Alert and oriented X 3. Moves all extremities spontaneously. Gait is normal. CNII-XII grossly in tact. Psych:  Responds to questions appropriately  with a normal affect.     ASSESSMENT AND PLAN:  62 y.o. year old female with  1. Allergic dermatitis due to poison vine Will give injection then taper of prednison. Also cont oral Benadryl.  - predniSONE (DELTASONE) 20 MG tablet; Take 3 daily for 2 days, then 2 daily for 2 days, then 1 daily for 2 days.  Dispense: 12 tablet; Refill: 0 - methylPREDNISolone acetate (DEPO-MEDROL) injection 80 mg; Inject 1 mL (80 mg total) into the muscle once.  2. HTN (hypertension) Currently on Propranolol. She states she does have h/o migraines so will cont Propranolol. Add Benazepril. RTC 2 weeks to recheck BP and will check BMET. - benazepril  (LOTENSIN) 10 MG tablet; Take 1 tablet (10 mg total) by mouth daily.  Dispense: 30 tablet; Refill: 3   Signed, [address] [place], [place], [facility] 05/04/2013 3:49 PM

## 2013-05-22 ENCOUNTER — Telehealth: Payer: Self-pay | Admitting: Family Medicine

## 2013-05-22 ENCOUNTER — Ambulatory Visit: Payer: BC Managed Care – PPO | Admitting: Family Medicine

## 2013-05-22 MED ORDER — PROPRANOLOL HCL ER BEADS 80 MG PO CP24: 80.0000 mg | ORAL_CAPSULE | Freq: Every day | ORAL | Status: DC

## 2013-05-22 NOTE — Telephone Encounter (Signed)
Rx Refilled  

## 2013-05-24 ENCOUNTER — Other Ambulatory Visit (INDEPENDENT_AMBULATORY_CARE_PROVIDER_SITE_OTHER): Payer: Self-pay | Admitting: Internal Medicine

## 2013-05-24 DIAGNOSIS — K219 Gastro-esophageal reflux disease without esophagitis: Secondary | ICD-10-CM

## 2013-06-12 ENCOUNTER — Ambulatory Visit (INDEPENDENT_AMBULATORY_CARE_PROVIDER_SITE_OTHER): Payer: BC Managed Care – PPO | Admitting: Family Medicine

## 2013-06-12 ENCOUNTER — Encounter: Payer: Self-pay | Admitting: Family Medicine

## 2013-06-12 VITALS — BP 118/80 | HR 68 | Temp 98.1°F | Resp 18 | Ht 65.0 in | Wt 188.0 lb

## 2013-06-12 DIAGNOSIS — I10 Essential (primary) hypertension: Secondary | ICD-10-CM

## 2013-06-12 MED ORDER — DICLOFENAC SODIUM 1 % TD GEL: 2.0000 g | Freq: Four times a day (QID) | TRANSDERMAL | Status: DC

## 2013-06-12 NOTE — Progress Notes (Signed)
Subjective:    Patient ID: Debra [name], female    DOB: 1951-04-22, 62 y.o.   MRN: [id number]  HPI  Patient is here for followup from her last office visit.  At that time she was diagnosed with poison ivy was given prednisone. She was also found to have an elevated blood pressure of 156/108.  At that time benazepril 10 mg by mouth daily was added 2 her propranolol.  However the patient never began the medication. She attributed the elevated blood pressure to stress at school. Since has been out in the last 3 weeks and she has been on a cruise and  her blood pressure has consistently been 110-125/75-80.  Otherwise she is doing well. She does complain of some diarrhea when she eats fruits and vegetables. Past Medical History  Diagnosis Date  . Unspecified hypothyroidism   . Generalized anxiety disorder   . Mixed hyperlipidemia   . Esophageal reflux   . Diverticulosis of colon (without mention of hemorrhage)   . Hiatal hernia   . PONV (postoperative nausea and vomiting)   . Iron deficiency anemia 01/19/2012  . Diverticulitis    Current Outpatient Prescriptions on File Prior to Visit  Medication Sig Dispense Refill  . buPROPion (WELLBUTRIN SR) 150 MG 12 hr tablet Take 1 tablet (150 mg total) by mouth 2 (two) times daily.  60 tablet  1  . cholecalciferol (VITAMIN D) 1000 UNITS tablet Take 1,000 Units by mouth daily.      . Iron-FA-B Cmp-C-Biot-Probiotic (FUSION PLUS PO) Take 1 tablet by mouth daily.      Marland Kitchen levothyroxine (SYNTHROID, LEVOTHROID) 100 MCG tablet Take 1 tablet (100 mcg total) by mouth daily.  30 tablet  5  . LORazepam (ATIVAN) 0.5 MG tablet Take 0.5-1 tablets (0.25-0.5 mg total) by mouth at bedtime.  60 tablet  1  . mometasone (ELOCON) 0.1 % cream Apply to area bid x 10 days  30 g  0  . Multiple Vitamin (MULITIVITAMIN WITH MINERALS) TABS Take 1 tablet by mouth daily.      Marland Kitchen omeprazole (PRILOSEC) 20 MG capsule TAKE 2 CAPSULES BY MOUTH ONCE DAILY  30 capsule  3  . pravastatin  (PRAVACHOL) 10 MG tablet Take 1 tablet (10 mg total) by mouth daily.  90 tablet  1  . propranolol (INNOPRAN XL) 80 MG 24 hr capsule Take 1 capsule (80 mg total) by mouth at bedtime.  30 capsule  2  . dicyclomine (BENTYL) 10 MG capsule Take 1 capsule (10 mg total) by mouth 2 (two) times daily before a meal.  60 capsule  1  . predniSONE (DELTASONE) 20 MG tablet Take 3 daily for 2 days, then 2 daily for 2 days, then 1 daily for 2 days.  12 tablet  0  . [DISCONTINUED] Fluticasone-Salmeterol (ADVAIR) 100-50 MCG/DOSE AEPB Inhale 1 puff into the lungs every 12 (twelve) hours.       No current facility-administered medications on file prior to visit.   Allergies  Allergen Reactions  . Codeine Nausea And Vomiting  . Morphine And Related Nausea And Vomiting  . Sulfa Antibiotics Hives and Itching   History   Social History  . Marital Status: Married    Spouse Name: N/A    Number of Children: N/A  . Years of Education: N/A   Occupational History  . Not on file.   Social History Main Topics  . Smoking status: Never Smoker   . Smokeless tobacco: Never Used  . Alcohol Use: No  .  Drug Use: No  . Sexually Active: Yes    Birth Control/ Protection: Surgical   Other Topics Concern  . Not on file   Social History Narrative  . No narrative on file   Ms. Torti had no medications administered during this visit.   Review of Systems  All other systems reviewed and are negative.       Objective:   Physical Exam  Vitals reviewed. Cardiovascular: Normal rate, regular rhythm and normal heart sounds.  Exam reveals no gallop and no friction rub.   No murmur heard. Pulmonary/Chest: Effort normal and breath sounds normal. No respiratory distress. She has no wheezes. She has no rales.  Abdominal: Soft. Bowel sounds are normal. She exhibits no distension. There is no tenderness. There is no rebound.   she has trace bipedal edema.         Assessment & Plan:  1. HTN (hypertension) Blood  pressure is currently well controlled. I recommended she continue the propranolol. However it does not appear that she needs the benazepril at this time.  I have asked her to monitor her blood pressure closely. If it is consistently higher than 140/90 I would recommend resuming the benazepril.  Otherwise follow up in 6 months as planned.  With regards to the diarrhea due to her irritable bowel syndrome, I recommended trying an over-the-counter probiotic such as Align one by mouth daily.

## 2013-06-21 ENCOUNTER — Encounter: Payer: Self-pay | Admitting: Family Medicine

## 2013-06-21 ENCOUNTER — Ambulatory Visit (INDEPENDENT_AMBULATORY_CARE_PROVIDER_SITE_OTHER): Payer: BC Managed Care – PPO | Admitting: Family Medicine

## 2013-06-21 VITALS — BP 110/80 | HR 68 | Temp 97.9°F | Resp 18 | Wt 191.0 lb

## 2013-06-21 DIAGNOSIS — J019 Acute sinusitis, unspecified: Secondary | ICD-10-CM

## 2013-06-21 DIAGNOSIS — J069 Acute upper respiratory infection, unspecified: Secondary | ICD-10-CM

## 2013-06-21 MED ORDER — FLUTICASONE PROPIONATE 50 MCG/ACT NA SUSP: 2.0000 | Freq: Every day | NASAL | Status: DC

## 2013-06-21 MED ORDER — AMOXICILLIN-POT CLAVULANATE 875-125 MG PO TABS: 1.0000 | ORAL_TABLET | Freq: Two times a day (BID) | ORAL | Status: DC

## 2013-06-21 NOTE — Patient Instructions (Addendum)
Take the antibiotics Start flonase for nasal drainage Use robitussin or Coricidan F/u as needed

## 2013-06-22 DIAGNOSIS — J069 Acute upper respiratory infection, unspecified: Secondary | ICD-10-CM | POA: Insufficient documentation

## 2013-06-22 DIAGNOSIS — J019 Acute sinusitis, unspecified: Secondary | ICD-10-CM | POA: Insufficient documentation

## 2013-06-22 NOTE — Assessment & Plan Note (Signed)
Treated based on duration worsening symptoms Antibiotics, flonase Mucinex samples given

## 2013-06-22 NOTE — Progress Notes (Signed)
  Subjective:    Patient ID: Debra [name], female    DOB: [birth year], 62 y.o.   MRN: [id number]  HPI  Pt here with sinus drainage, sore throat and mild productive cough for the past 2 weeks. [place] symptoms started as sore throat and have progressed. Has been using nasal saline, vicks, and humidifier with little improvement.    Review of Systems  GEN- + fatigue,denies  fever, weight loss,weakness, recent illness HEENT- denies eye drainage, change in vision, +nasal discharge, CVS- denies chest pain, palpitations RESP- denies SOB, +cough, wheeze Neuro- denies headache, dizziness, syncope, seizure activity      Objective:   Physical Exam  GEN- NAD, alert and oriented x3 HEENT- PERRL, EOMI, non injected sclera, pink conjunctiva, MMM, oropharynx mild injection, TM clear bilat no effusion, + maxillary sinus tenderness, inflammed turbinates,  Nasal drainage  Neck- Supple, shotty LAD CVS- RRR, no murmur RESP-CTAB EXT- No edema Pulses- Radial 2+         Assessment & Plan:

## 2013-06-22 NOTE — Assessment & Plan Note (Signed)
URI per above, also with sinusitis, post nasal drip likley main cause of cough

## 2013-08-15 ENCOUNTER — Telehealth: Payer: Self-pay | Admitting: Family Medicine

## 2013-08-15 ENCOUNTER — Ambulatory Visit (INDEPENDENT_AMBULATORY_CARE_PROVIDER_SITE_OTHER): Payer: BC Managed Care – PPO | Admitting: Family Medicine

## 2013-08-15 VITALS — BP 110/70 | HR 78 | Temp 97.7°F | Resp 16 | Wt 188.0 lb

## 2013-08-15 DIAGNOSIS — N952 Postmenopausal atrophic vaginitis: Secondary | ICD-10-CM

## 2013-08-15 DIAGNOSIS — N390 Urinary tract infection, site not specified: Secondary | ICD-10-CM

## 2013-08-15 LAB — URINALYSIS, ROUTINE W REFLEX MICROSCOPIC
Bilirubin Urine: NEGATIVE
Hgb urine dipstick: NEGATIVE
Ketones, ur: NEGATIVE mg/dL
Nitrite: NEGATIVE
Specific Gravity, Urine: >1.03 — ABNORMAL HIGH (ref 1.005–1.030)
pH: 5.5 (ref 5.0–8.0)

## 2013-08-15 MED ORDER — PROPRANOLOL HCL ER BEADS 80 MG PO CP24: 80.0000 mg | ORAL_CAPSULE | Freq: Every day | ORAL | Status: DC

## 2013-08-15 MED ORDER — ESTRADIOL 0.1 MG/GM VA CREA: 2.0000 g | TOPICAL_CREAM | Freq: Every day | VAGINAL | Status: DC

## 2013-08-15 NOTE — Patient Instructions (Signed)
2 gram inserted daily for 1 week, then 1 gram twice a week for maintance Call for any side effects

## 2013-08-15 NOTE — Telephone Encounter (Signed)
Rx Refilled  

## 2013-08-15 NOTE — Telephone Encounter (Signed)
Propranolol ER 80 mg cap 1 QHS #30

## 2013-08-17 ENCOUNTER — Encounter: Payer: Self-pay | Admitting: Family Medicine

## 2013-08-17 DIAGNOSIS — N952 Postmenopausal atrophic vaginitis: Secondary | ICD-10-CM | POA: Insufficient documentation

## 2013-08-17 NOTE — Progress Notes (Signed)
  Subjective:    Patient ID: Debra [name], female    DOB: 11-05-1951, 61 y.o.   MRN: [id number]  HPI  Patient presents with concern for UTI. She states over the past few months she's had recurrent episodes where she feels a burning sensation in her urine runs over her vaginal area. She also feels very irritated in the vaginal area but denies any rash or discharge. She states that she feels very dry in the vaginal area and has felt like this for many years. She's been menopausal for greater than 10 years. She is history of hysterectomy for benign reasons. She did not want to go on any hormone therapy in the past.  Review of Systems - per above  GEN- denies fatigue, fever, weight loss,weakness, recent illness  GI- denies abd pain, N/V, change in stools GU- denies dysuria, hematuria, dribbling, incontinence, denies vaginal discharge, + vaginal irritation MSK- denies joint pain, muscle aches, injury Neuro- denies headache, dizziness, syncope, seizure activity      Objective:   Physical Exam   GEN-NAD,alert and oriented x 3  GU- Deferred       Assessment & Plan:

## 2013-08-17 NOTE — Assessment & Plan Note (Signed)
Will treat with topical estrogen trial Urine negative DIscussed side effects of medication Pt wishes to proceed

## 2013-08-18 ENCOUNTER — Telehealth: Payer: Self-pay | Admitting: Family Medicine

## 2013-08-18 MED ORDER — BUPROPION HCL ER (SR) 150 MG PO TB12: 150.0000 mg | ORAL_TABLET | Freq: Two times a day (BID) | ORAL | Status: DC

## 2013-08-18 NOTE — Telephone Encounter (Signed)
Bupropion HCL SR 150 mg tab 1 BID #60

## 2013-08-18 NOTE — Telephone Encounter (Signed)
Rx Refilled  

## 2013-09-01 ENCOUNTER — Telehealth: Payer: Self-pay | Admitting: Family Medicine

## 2013-09-01 MED ORDER — PRAVASTATIN SODIUM 10 MG PO TABS: 10.0000 mg | ORAL_TABLET | Freq: Every day | ORAL | Status: DC

## 2013-09-01 NOTE — Telephone Encounter (Signed)
Pravastatin Sodium 10 mg tab 1 QD #90

## 2013-09-01 NOTE — Telephone Encounter (Signed)
Rx Refilled  

## 2013-09-12 ENCOUNTER — Telehealth: Payer: Self-pay | Admitting: Family Medicine

## 2013-09-12 NOTE — Telephone Encounter (Signed)
Patient needs to make an appointment  ASAP . I have made her an appointment for Thursday The 9 at 2:45 am . However she wants to see if she can be seen earlier. Please call and advise.

## 2013-09-12 NOTE — Telephone Encounter (Signed)
LMTRC

## 2013-09-13 ENCOUNTER — Ambulatory Visit (INDEPENDENT_AMBULATORY_CARE_PROVIDER_SITE_OTHER): Payer: BC Managed Care – PPO | Admitting: Physician Assistant

## 2013-09-13 ENCOUNTER — Encounter: Payer: Self-pay | Admitting: Physician Assistant

## 2013-09-13 VITALS — BP 122/86 | HR 80 | Temp 98.6°F | Resp 20 | Wt 190.0 lb

## 2013-09-13 DIAGNOSIS — A499 Bacterial infection, unspecified: Secondary | ICD-10-CM

## 2013-09-13 DIAGNOSIS — H6690 Otitis media, unspecified, unspecified ear: Secondary | ICD-10-CM

## 2013-09-13 DIAGNOSIS — H6692 Otitis media, unspecified, left ear: Secondary | ICD-10-CM

## 2013-09-13 DIAGNOSIS — J988 Other specified respiratory disorders: Secondary | ICD-10-CM

## 2013-09-13 DIAGNOSIS — J029 Acute pharyngitis, unspecified: Secondary | ICD-10-CM

## 2013-09-13 LAB — RAPID STREP SCREEN (MED CTR MEBANE ONLY): Streptococcus, Group A Screen (Direct): NEGATIVE

## 2013-09-13 MED ORDER — BENZONATATE 200 MG PO CAPS: 200.0000 mg | ORAL_CAPSULE | Freq: Two times a day (BID) | ORAL | Status: DC | PRN

## 2013-09-13 MED ORDER — AZITHROMYCIN 250 MG PO TABS: ORAL_TABLET | ORAL | Status: DC

## 2013-09-13 MED ORDER — PREDNISONE 20 MG PO TABS: ORAL_TABLET | ORAL | Status: DC

## 2013-09-13 NOTE — Progress Notes (Signed)
Patient ID: Debra [name] MRN: [id number], DOB: [birth year], 62 y.o. Date of Encounter: 09/13/2013, 12:23 PM    Chief Complaint:  Chief Complaint  Patient presents with  . fighting cold x 2 weeks    ears plugged, can't swallow, cough     HPI: 62 y.o. year old white female reports that she has been sick for 3 weeks. Now has gotten to the point that she feels that her head and ears are going to "blow up." At times she is able to blow a little bit from her nose but for the most part it just feels very stopped up. As well she is having a bad coughing spells. Has chest congestion. Lymph nodes in her neck feels as though that she has been putting a heating pad on them. She has had no fever or chills. 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 She feels quite certain that she picked up this infection there. She is using multiple over-the-counter medications without relief.     Home Meds: See attached medication section for any medications that were entered at today's visit. The computer does not put those onto this list.The following list is a list of meds entered prior to today's visit.   Current Outpatient Prescriptions on File Prior to Visit  Medication Sig Dispense Refill  . buPROPion (WELLBUTRIN SR) 150 MG 12 hr tablet Take 1 tablet (150 mg total) by mouth 2 (two) times daily.  60 tablet  5  . cholecalciferol (VITAMIN D) 1000 UNITS tablet Take 1,000 Units by mouth daily.      . diclofenac sodium (VOLTAREN) 1 % GEL Apply 2 g topically 4 (four) times daily.  1 Tube  5  . dicyclomine (BENTYL) 10 MG capsule Take 1 capsule (10 mg total) by mouth 2 (two) times daily before a meal.  60 capsule  1  . estradiol (ESTRACE VAGINAL) 0.1 MG/GM vaginal cream Place 0.25 Applicatorfuls vaginally daily.  42.5 g  3  . fluticasone (FLONASE) 50  MCG/ACT nasal spray Place 2 sprays into the nose daily.  16 g  1  . Iron-FA-B Cmp-C-Biot-Probiotic (FUSION PLUS PO) Take 1 tablet by mouth daily.      Marland Kitchen levothyroxine (SYNTHROID, LEVOTHROID) 100 MCG tablet Take 1 tablet (100 mcg total) by mouth daily.  30 tablet  5  . LORazepam (ATIVAN) 0.5 MG tablet Take 0.5-1 tablets (0.25-0.5 mg total) by mouth at bedtime.  60 tablet  1  . mometasone (ELOCON) 0.1 % cream Apply to area bid x 10 days  30 g  0  . Multiple Vitamin (MULITIVITAMIN WITH MINERALS) TABS Take 1 tablet by mouth daily.      Marland Kitchen omeprazole (PRILOSEC) 20 MG capsule TAKE 2 CAPSULES BY MOUTH ONCE DAILY  30 capsule  3  . pravastatin (PRAVACHOL) 10 MG tablet Take 1 tablet (10 mg total) by mouth daily.  90 tablet  1  . propranolol (INNOPRAN XL) 80 MG 24 hr capsule Take 1 capsule (80 mg total) by mouth at bedtime.  30 capsule  2  . [DISCONTINUED] Fluticasone-Salmeterol (ADVAIR) 100-50 MCG/DOSE AEPB Inhale 1 puff into the lungs every 12 (twelve) hours.       No current facility-administered medications on file prior to visit.    Allergies:  Allergies  Allergen Reactions  . Codeine Nausea And Vomiting  .  Morphine And Related Nausea And Vomiting  . Sulfa Antibiotics Hives and Itching      Review of Systems: See HPI for pertinent ROS. All other ROS negative.    Physical Exam: Blood pressure 122/86, pulse 80, temperature 98.6 F (37 C), temperature source Oral, resp. rate 20, weight 190 lb (86.183 kg)., Body mass index is 31.62 kg/(m^2). General: White female. Appears nontoxic but does appear extremely ill. Sounds very congested and hoarse. Appears in no acute distress. HEENT: Normocephalic, atraumatic, eyes without discharge, sclera non-icteric, nares are without discharge. Bilateral auditory canals clear, TM's are without perforation. Left TM is abnormal: Inferior portion is golden color. Superior portion is bright red erythema. Right TM is somewhat dull but otherwise normal. Left Pharynx  has moderate to severe erythema. Right pharynx has mild erythema. Left maxillary sinus is tender with percussion. Right maxillary sinus has minimal tenderness.  Neck: Supple. No thyromegaly. Cervical lymph nodes are all mildly enlarged and tender. Lungs: Very slight inspiratory wheezes throughout. Good air movement. No expiratory wheezes, no rhonchi or rales. Heart: Regular rhythm. No murmurs, rubs, or gallops. Msk:  Strength and tone normal for age. Extremities/Skin: Warm and dry. No clubbing or cyanosis. No edema. No rashes or suspicious lesions. Neuro: Alert and oriented X 3. Moves all extremities spontaneously. Gait is normal. CNII-XII grossly in tact. Psych:  Responds to questions appropriately with a normal affect.   Results for orders placed in visit on 09/13/13  RAPID STREP SCREEN      Result Value Range   Source THROAT     Streptococcus, Group A Screen (Direct) NEG  NEGATIVE     ASSESSMENT AND PLAN:  62 y.o. year old female with  1. Bacterial respiratory infection - azithromycin (ZITHROMAX) 250 MG tablet; Day 1: Take 2.  Days 2-5: Take 1 daily.  Dispense: 6 tablet; Refill: 0 - predniSONE (DELTASONE) 20 MG tablet; Take 3 daily for 2 days, then 2 daily for 2 days, then 1 daily for 2 days.  Dispense: 12 tablet; Refill: 0 - benzonatate (TESSALON) 200 MG capsule; Take 1 capsule (200 mg total) by mouth 2 (two) times daily as needed for cough.  Dispense: 20 capsule; Refill: 0  2. Left otitis media - azithromycin (ZITHROMAX) 250 MG tablet; Day 1: Take 2.  Days 2-5: Take 1 daily.  Dispense: 6 tablet; Refill: 0 - predniSONE (DELTASONE) 20 MG tablet; Take 3 daily for 2 days, then 2 daily for 2 days, then 1 daily for 2 days.  Dispense: 12 tablet; Refill: 0  3. Sore throat  - Rapid Strep Screen - azithromycin (ZITHROMAX) 250 MG tablet; Day 1: Take 2.  Days 2-5: Take 1 daily.  Dispense: 6 tablet; Refill: 0 - predniSONE (DELTASONE) 20 MG tablet; Take 3 daily for 2 days, then 2 daily for 2  days, then 1 daily for 2 days.  Dispense: 12 tablet; Refill: 0  She is given a note to be out of work through Friday 09/15/13. She is to followup with me if her symptoms do not resolve with completion of antibiotics and prednisone.  [address] [place], [place], [facility] 09/13/2013 12:23 PM

## 2013-09-14 ENCOUNTER — Ambulatory Visit: Payer: BC Managed Care – PPO | Admitting: Family Medicine

## 2013-09-14 ENCOUNTER — Ambulatory Visit: Payer: BC Managed Care – PPO | Admitting: Physician Assistant

## 2013-09-15 ENCOUNTER — Other Ambulatory Visit: Payer: Self-pay | Admitting: Family Medicine

## 2013-09-15 DIAGNOSIS — Z139 Encounter for screening, unspecified: Secondary | ICD-10-CM

## 2013-10-02 ENCOUNTER — Ambulatory Visit (HOSPITAL_COMMUNITY)
Admission: RE | Admit: 2013-10-02 | Discharge: 2013-10-02 | Disposition: A | Discharge status: Home / Self Care | Payer: BC Managed Care – PPO | Source: Ambulatory Visit | Attending: Family Medicine | Admitting: Family Medicine

## 2013-10-02 DIAGNOSIS — Z1231 Encounter for screening mammogram for malignant neoplasm of breast: Secondary | ICD-10-CM | POA: Insufficient documentation

## 2013-10-02 DIAGNOSIS — Z139 Encounter for screening, unspecified: Secondary | ICD-10-CM

## 2013-10-10 ENCOUNTER — Encounter: Payer: Self-pay | Admitting: Family Medicine

## 2013-10-10 ENCOUNTER — Ambulatory Visit (INDEPENDENT_AMBULATORY_CARE_PROVIDER_SITE_OTHER): Payer: BC Managed Care – PPO | Admitting: Family Medicine

## 2013-10-10 VITALS — BP 138/74 | HR 78 | Temp 97.7°F | Resp 16 | Ht 65.0 in | Wt 188.0 lb

## 2013-10-10 DIAGNOSIS — R5381 Other malaise: Secondary | ICD-10-CM

## 2013-10-10 DIAGNOSIS — M79609 Pain in unspecified limb: Secondary | ICD-10-CM

## 2013-10-10 DIAGNOSIS — M79671 Pain in right foot: Secondary | ICD-10-CM

## 2013-10-10 MED ORDER — DICLOFENAC SODIUM 1 % TD GEL: 2.0000 g | Freq: Four times a day (QID) | TRANSDERMAL | Status: DC

## 2013-10-10 MED ORDER — DICLOFENAC SODIUM 75 MG PO TBEC: 75.0000 mg | DELAYED_RELEASE_TABLET | Freq: Two times a day (BID) | ORAL | Status: DC

## 2013-10-10 NOTE — Progress Notes (Signed)
Subjective:    Patient ID: Debra [name], female    DOB: 18-Jan-1951, 62 y.o.   MRN: [id number]  HPI Patient is requesting a refill on voltaren gel for generalized aches in the joints on her hands and her feet. She is never tried oral diclofenac but is interested in trying it due to cost.  She has 2 major concerns today. #1 she has a bony lump on the dorsum of her right foot. It is at the tarsometatarsal row in the center of the foot. It is tender to palpation. He becomes more painful with prolonged standing or walking. She also complains of severe unrelenting fatigue over the last month. This recently followed respiratory infection. She believes she is simply slow in recovering however she is concerned by the length of time she is felt tired.  She denies any melena or hematochezia. She denies the fevers chills or night sweats. She denies any weight loss. She denies any chest pain shortness of breath or dyspnea on exertion. She denies any nausea vomiting or diarrhea. Past Medical History  Diagnosis Date  . Unspecified hypothyroidism   . Generalized anxiety disorder   . Mixed hyperlipidemia   . Esophageal reflux   . Diverticulosis of colon (without mention of hemorrhage)   . Hiatal hernia   . PONV (postoperative nausea and vomiting)   . Iron deficiency anemia 01/19/2012  . Diverticulitis    Past Surgical History  Procedure Laterality Date  . Colonoscopy  2011-2008    Dr. Lovell Sheehan  . Cesarean section      First one 1978 , second on 1984  . Hysterctomy  1988  . Abdominal hysterectomy    . Tonsillectomy    . Appendectomy    . Colonoscopy  03/10/2012    Procedure: COLONOSCOPY;  Surgeon: Malissa Hippo, MD;  Location: [facility];  Service: Endoscopy;  Laterality: N/A;  200  . Colon resection  05/11/2012    Procedure: HAND ASSISTED LAPAROSCOPIC COLON RESECTION;  Surgeon: Dalia Heading, MD;  Location: [facility];  Service: General;  Laterality: N/A;  Laparoscopic Hand Assisted Partial Colectomy;   . Bowel resection  05/11/2012    Procedure: LOW ANTERIOR BOWEL RESECTION;  Surgeon: Dalia Heading, MD;  Location: [facility];  Service: General;;   Current Outpatient Prescriptions on File Prior to Visit  Medication Sig Dispense Refill  . azithromycin (ZITHROMAX) 250 MG tablet Day 1: Take 2.  Days 2-5: Take 1 daily.  6 tablet  0  . buPROPion (WELLBUTRIN SR) 150 MG 12 hr tablet Take 1 tablet (150 mg total) by mouth 2 (two) times daily.  60 tablet  5  . cholecalciferol (VITAMIN D) 1000 UNITS tablet Take 1,000 Units by mouth daily.      Marland Kitchen dicyclomine (BENTYL) 10 MG capsule Take 1 capsule (10 mg total) by mouth 2 (two) times daily before a meal.  60 capsule  1  . estradiol (ESTRACE VAGINAL) 0.1 MG/GM vaginal cream Place 0.25 Applicatorfuls vaginally daily.  42.5 g  3  . fluticasone (FLONASE) 50 MCG/ACT nasal spray Place 2 sprays into the nose daily.  16 g  1  . Iron-FA-B Cmp-C-Biot-Probiotic (FUSION PLUS PO) Take 1 tablet by mouth daily.      Marland Kitchen levothyroxine (SYNTHROID, LEVOTHROID) 100 MCG tablet Take 1 tablet (100 mcg total) by mouth daily.  30 tablet  5  . LORazepam (ATIVAN) 0.5 MG tablet Take 0.5-1 tablets (0.25-0.5 mg total) by mouth at bedtime.  60 tablet  1  .  mometasone (ELOCON) 0.1 % cream Apply to area bid x 10 days  30 g  0  . Multiple Vitamin (MULITIVITAMIN WITH MINERALS) TABS Take 1 tablet by mouth daily.      Marland Kitchen omeprazole (PRILOSEC) 20 MG capsule TAKE 2 CAPSULES BY MOUTH ONCE DAILY  30 capsule  3  . pravastatin (PRAVACHOL) 10 MG tablet Take 1 tablet (10 mg total) by mouth daily.  90 tablet  1  . propranolol (INNOPRAN XL) 80 MG 24 hr capsule Take 1 capsule (80 mg total) by mouth at bedtime.  30 capsule  2  . [DISCONTINUED] Fluticasone-Salmeterol (ADVAIR) 100-50 MCG/DOSE AEPB Inhale 1 puff into the lungs every 12 (twelve) hours.       No current facility-administered medications on file prior to visit.   Allergies  Allergen Reactions  . Codeine Nausea And Vomiting  . Morphine And  Related Nausea And Vomiting  . Sulfa Antibiotics Hives and Itching   History   Social History  . Marital Status: Married    Spouse Name: N/A    Number of Children: N/A  . Years of Education: N/A   Occupational History  . Not on file.   Social History Main Topics  . Smoking status: Never Smoker   . Smokeless tobacco: Never Used  . Alcohol Use: No  . Drug Use: No  . Sexual Activity: Yes    Birth Control/ Protection: Surgical   Other Topics Concern  . Not on file   Social History Narrative  . No narrative on file      Review of Systems  All other systems reviewed and are negative.       Objective:   Physical Exam  Vitals reviewed. Constitutional: She is oriented to person, place, and time. She appears well-developed and well-nourished.  HENT:  Nose: Nose normal.  Mouth/Throat: Oropharynx is clear and moist. No oropharyngeal exudate.  Eyes: Conjunctivae are normal. No scleral icterus.  Neck: Neck supple. No JVD present. No thyromegaly present.  Cardiovascular: Normal rate, regular rhythm and normal heart sounds.  Exam reveals no gallop and no friction rub.   No murmur heard. Pulmonary/Chest: Effort normal and breath sounds normal. No respiratory distress. She has no wheezes. She has no rales.  Abdominal: Soft. Bowel sounds are normal. She exhibits no distension and no mass. There is no tenderness. There is no rebound and no guarding.  Musculoskeletal: She exhibits no edema.  Lymphadenopathy:    She has no cervical adenopathy.  Neurological: She is alert and oriented to person, place, and time. She has normal reflexes. She displays normal reflexes. She exhibits normal muscle tone. Coordination normal.  Skin: No rash noted.   2 cm palpable bony lump on the dorsum of the right foot at the tarsal/metatarsal row.        Assessment & Plan:  1. Right foot pain I believe the palpable abnormality is likely an osteophyte/bone spur due to osteoarthritis. I will obtain  an x-ray of the foot. Meanwhile I recommended diclofenac 75 mg by mouth twice a day as needed for pain - DG Foot Complete Right; Future  2. Other malaise and fatigue Her exam today is totally benign. I will check some basic screening laboratory metabolic causes of fatigue. If the lab work is normal I reassured the patient that she should gradually improve as the viral illness resolves. - COMPLETE METABOLIC PANEL WITH GFR - CBC with Differential - TSH - Sedimentation rate - Vitamin B12

## 2013-10-11 ENCOUNTER — Telehealth: Payer: Self-pay | Admitting: Family Medicine

## 2013-10-11 NOTE — Telephone Encounter (Signed)
Sent PA to [employer] through [email or web address]

## 2013-10-11 NOTE — Telephone Encounter (Signed)
appt made

## 2013-10-12 NOTE — Telephone Encounter (Signed)
PA Approved 10/072014 - 10/12/2014

## 2013-10-16 ENCOUNTER — Other Ambulatory Visit: Payer: Self-pay | Admitting: Family Medicine

## 2013-10-16 MED ORDER — LEVOTHYROXINE SODIUM 100 MCG PO TABS: 100.0000 ug | ORAL_TABLET | Freq: Every day | ORAL | Status: DC

## 2013-10-16 NOTE — Telephone Encounter (Signed)
Rx Refilled  

## 2013-11-06 ENCOUNTER — Telehealth: Payer: Self-pay | Admitting: Family Medicine

## 2013-11-06 NOTE — Telephone Encounter (Signed)
Refill - Xanax .5 mg 1/2 - 1 qhs prn  Last refill 6/14

## 2013-11-06 NOTE — Telephone Encounter (Signed)
ok 

## 2013-11-07 MED ORDER — LORAZEPAM 0.5 MG PO TABS: 0.2500 mg | ORAL_TABLET | Freq: Every day | ORAL | Status: DC

## 2013-11-07 NOTE — Telephone Encounter (Signed)
Medication should be Lorazepam NOT xanax..per wtp ok to call in

## 2013-11-07 NOTE — Telephone Encounter (Signed)
rx was printed and faxed to pharmacy 

## 2013-12-01 ENCOUNTER — Encounter: Payer: Self-pay | Admitting: Family Medicine

## 2013-12-06 ENCOUNTER — Other Ambulatory Visit: Payer: BC Managed Care – PPO

## 2013-12-06 LAB — COMPLETE METABOLIC PANEL WITH GFR
Albumin: 4.4 g/dL (ref 3.5–5.2)
BUN: 16 mg/dL (ref 6–23)
Calcium: 9.4 mg/dL (ref 8.4–10.5)
Chloride: 104 mEq/L (ref 96–112)
GFR, Est Non African American: 68 mL/min
Glucose, Bld: 92 mg/dL (ref 70–99)
Potassium: 4.3 mEq/L (ref 3.5–5.3)

## 2013-12-06 LAB — CBC WITH DIFFERENTIAL/PLATELET
Basophils Absolute: 0 10*3/uL (ref 0.0–0.1)
Basophils Relative: 1 % (ref 0–1)
Eosinophils Relative: 2 % (ref 0–5)
Lymphocytes Relative: 37 % (ref 12–46)
Lymphs Abs: 1.9 10*3/uL (ref 0.7–4.0)
MCHC: 34.5 g/dL (ref 30.0–36.0)
MCV: 87.9 fL (ref 78.0–100.0)
Platelets: 237 10*3/uL (ref 150–400)
RDW: 14.8 % (ref 11.5–15.5)
WBC: 5.1 10*3/uL (ref 4.0–10.5)

## 2013-12-06 LAB — SEDIMENTATION RATE: Sed Rate: 1 mm/hr (ref 0–22)

## 2013-12-08 ENCOUNTER — Encounter: Payer: Self-pay | Admitting: Family Medicine

## 2013-12-14 ENCOUNTER — Telehealth: Payer: Self-pay | Admitting: Family Medicine

## 2013-12-14 NOTE — Telephone Encounter (Signed)
Pt husband is calling because his wife never received her cholesterol results  Call back number is [phone] Can leave a message

## 2013-12-19 ENCOUNTER — Telehealth: Payer: Self-pay | Admitting: Family Medicine

## 2013-12-19 MED ORDER — PROPRANOLOL HCL ER BEADS 80 MG PO CP24: 80.0000 mg | ORAL_CAPSULE | Freq: Every day | ORAL | Status: DC

## 2013-12-19 NOTE — Telephone Encounter (Signed)
Medication refilled per protocol. 

## 2013-12-20 NOTE — Telephone Encounter (Signed)
Message copied by Alyson Locket on Wed Dec 20, 2013  8:26 AM ------      Message from: [facility], Charlynne Cousins      Created: Tue Dec 19, 2013  4:29 PM       She needs her Inderal La Called in to [employer] and her husband Bena Kobel Needs Tamsulonsin HCL 0.4 called in for his      Prostate Ca.  She also needs an order to get her rt foot x-rayed sent to [facility].  She was supposed to have x-ray done in October but she has been dealing with her husbands situation ------

## 2013-12-20 NOTE — Telephone Encounter (Signed)
Meds were refilled and order for xray is in epic. Marland KitchenPatient aware per vm.

## 2013-12-22 ENCOUNTER — Ambulatory Visit (HOSPITAL_COMMUNITY)
Admission: RE | Admit: 2013-12-22 | Discharge: 2013-12-22 | Disposition: A | Discharge status: Home / Self Care | Payer: BC Managed Care – PPO | Source: Ambulatory Visit | Attending: Family Medicine | Admitting: Family Medicine

## 2013-12-22 DIAGNOSIS — M259 Joint disorder, unspecified: Secondary | ICD-10-CM | POA: Insufficient documentation

## 2013-12-22 DIAGNOSIS — M79671 Pain in right foot: Secondary | ICD-10-CM

## 2013-12-22 DIAGNOSIS — Z4789 Encounter for other orthopedic aftercare: Secondary | ICD-10-CM | POA: Insufficient documentation

## 2013-12-22 DIAGNOSIS — M79609 Pain in unspecified limb: Secondary | ICD-10-CM | POA: Insufficient documentation

## 2014-01-01 ENCOUNTER — Telehealth: Payer: Self-pay | Admitting: Family Medicine

## 2014-01-01 DIAGNOSIS — S92901A Unspecified fracture of right foot, initial encounter for closed fracture: Secondary | ICD-10-CM

## 2014-01-01 NOTE — Telephone Encounter (Signed)
Message copied by Olena Mater on Mon Jan 01, 2014  4:10 PM ------      Message from: Debra [name]      Created: Mon Dec 25, 2013  7:35 AM       Patient has broken the 4th and 5th metatarsals on her foot.  Recommend CAM walker and ortho consult given the tendency of these fractures to not heal properly and have nonunion. ------

## 2014-01-01 NOTE — Telephone Encounter (Signed)
States has been trying to call us back but has had trouble getting thru to office.  Told about fractures seen on foot xray.  Given RX for CAM walker and ortho referral started.

## 2014-01-11 ENCOUNTER — Ambulatory Visit (INDEPENDENT_AMBULATORY_CARE_PROVIDER_SITE_OTHER): Payer: BC Managed Care – PPO | Admitting: Orthopedic Surgery

## 2014-01-11 VITALS — BP 121/65 | Ht 65.0 in | Wt 187.0 lb

## 2014-01-11 DIAGNOSIS — S92909A Unspecified fracture of unspecified foot, initial encounter for closed fracture: Secondary | ICD-10-CM

## 2014-01-11 DIAGNOSIS — S92901A Unspecified fracture of right foot, initial encounter for closed fracture: Secondary | ICD-10-CM

## 2014-01-11 NOTE — Patient Instructions (Signed)
Remove the boot

## 2014-01-12 ENCOUNTER — Encounter: Payer: Self-pay | Admitting: Orthopedic Surgery

## 2014-01-12 DIAGNOSIS — S92901A Unspecified fracture of right foot, initial encounter for closed fracture: Secondary | ICD-10-CM | POA: Insufficient documentation

## 2014-01-12 NOTE — Progress Notes (Signed)
Patient ID: Debra [name], female   DOB: 04-27-1951, 63 y.o.   MRN: [id number]  Chief Complaint  Patient presents with  . Foot Pain    Right foot knot and fractured 4th and 5 th toes DOI - unknown. Consult from Dr. Dennard Schaumann   HISTORY: 63 year old female comes to [place] in consultation after she went to her primary care physician for a knot on the dorsum of her right foot. X-ray showed fourth and fifth basilar metatarsal fractures with no history of trauma. The patient denies any history of metabolic bone disease although she is hypothyroid but she is being treated. She's had a bone density 8 years ago which was normal. No evidence of calcium deficiency.  She is not complaining of any pain  Review of systems show intermittent diarrhea diverticulitis secondary to distal colon resection generalized muscle stiffness and swelling. Otherwise review of systems negative  Past Medical History  Diagnosis Date  . Unspecified hypothyroidism   . Generalized anxiety disorder   . Mixed hyperlipidemia   . Esophageal reflux   . Diverticulosis of colon (without mention of hemorrhage)   . Hiatal hernia   . PONV (postoperative nausea and vomiting)   . Iron deficiency anemia 01/19/2012  . Diverticulitis    Past Surgical History  Procedure Laterality Date  . Colonoscopy  2011-2008    Dr. Arnoldo Morale  . Cesarean section      First one 1978 , second on 1984  . Hysterctomy  1988  . Abdominal hysterectomy    . Tonsillectomy    . Appendectomy    . Colonoscopy  03/10/2012    Procedure: COLONOSCOPY;  Surgeon: Rogene Houston, MD;  Location: [facility];  Service: Endoscopy;  Laterality: N/A;  200  . Colon resection  05/11/2012    Procedure: HAND ASSISTED LAPAROSCOPIC COLON RESECTION;  Surgeon: Jamesetta So, MD;  Location: [facility];  Service: General;  Laterality: N/A;  Laparoscopic Hand Assisted Partial Colectomy;  . Bowel resection  05/11/2012    Procedure: LOW ANTERIOR BOWEL RESECTION;  Surgeon: Jamesetta So, MD;   Location: [facility];  Service: General;;    Vital signs: BP 121/65  Ht 5\' 5"  (1.651 m)  Wt 187 lb (84.823 kg)  BMI 31.12 kg/m2 General the patient is well-developed and well-nourished grooming and hygiene are normal Oriented x3 Mood and affect normal Ambulation normal  Inspection of the right foot shows a mass on the dorsum of the foot consistent with an osteophyte. There is no tenderness in the fourth and fifth metatarsal bone. Full range of motion All joints are stable Motor exam is normal Skin clean dry and intact  Cardiovascular exam is normal Sensory exam normal  X-rays showed dorsal spur with healed fractures fourth and fifth metatarsal  Plan recommend bone density repeat test and vitamin D tests with her primary care physician

## 2014-02-09 ENCOUNTER — Encounter: Payer: Self-pay | Admitting: Family Medicine

## 2014-02-09 ENCOUNTER — Ambulatory Visit (INDEPENDENT_AMBULATORY_CARE_PROVIDER_SITE_OTHER): Payer: BC Managed Care – PPO | Admitting: Family Medicine

## 2014-02-09 VITALS — BP 136/68 | HR 66 | Temp 98.2°F | Resp 18 | Ht 61.0 in | Wt 187.0 lb

## 2014-02-09 DIAGNOSIS — E669 Obesity, unspecified: Secondary | ICD-10-CM

## 2014-02-09 DIAGNOSIS — M899 Disorder of bone, unspecified: Secondary | ICD-10-CM

## 2014-02-09 DIAGNOSIS — M2550 Pain in unspecified joint: Secondary | ICD-10-CM

## 2014-02-09 DIAGNOSIS — M8580 Other specified disorders of bone density and structure, unspecified site: Secondary | ICD-10-CM

## 2014-02-09 DIAGNOSIS — M949 Disorder of cartilage, unspecified: Secondary | ICD-10-CM

## 2014-02-09 DIAGNOSIS — L723 Sebaceous cyst: Secondary | ICD-10-CM

## 2014-02-09 DIAGNOSIS — L720 Epidermal cyst: Secondary | ICD-10-CM

## 2014-02-09 DIAGNOSIS — J019 Acute sinusitis, unspecified: Secondary | ICD-10-CM

## 2014-02-09 DIAGNOSIS — Z13220 Encounter for screening for lipoid disorders: Secondary | ICD-10-CM

## 2014-02-09 DIAGNOSIS — L089 Local infection of the skin and subcutaneous tissue, unspecified: Secondary | ICD-10-CM

## 2014-02-09 DIAGNOSIS — E7800 Pure hypercholesterolemia, unspecified: Secondary | ICD-10-CM

## 2014-02-09 DIAGNOSIS — Z23 Encounter for immunization: Secondary | ICD-10-CM

## 2014-02-09 DIAGNOSIS — E785 Hyperlipidemia, unspecified: Secondary | ICD-10-CM

## 2014-02-09 MED ORDER — LORAZEPAM 0.5 MG PO TABS: 0.2500 mg | ORAL_TABLET | Freq: Every day | ORAL | Status: DC

## 2014-02-09 MED ORDER — CEPHALEXIN 500 MG PO CAPS: 500.0000 mg | ORAL_CAPSULE | Freq: Two times a day (BID) | ORAL | Status: DC

## 2014-02-09 NOTE — Patient Instructions (Signed)
We will call with lab results Referral to rheumatology, Nutritionist Referral for Bone Density Pneumonia Vaccine given Start keflex  Use aspercreme on foot F/U 4 months

## 2014-02-11 DIAGNOSIS — J019 Acute sinusitis, unspecified: Secondary | ICD-10-CM | POA: Insufficient documentation

## 2014-02-11 DIAGNOSIS — M8580 Other specified disorders of bone density and structure, unspecified site: Secondary | ICD-10-CM | POA: Insufficient documentation

## 2014-02-11 DIAGNOSIS — L720 Epidermal cyst: Secondary | ICD-10-CM

## 2014-02-11 DIAGNOSIS — M2550 Pain in unspecified joint: Secondary | ICD-10-CM | POA: Insufficient documentation

## 2014-02-11 DIAGNOSIS — L089 Local infection of the skin and subcutaneous tissue, unspecified: Secondary | ICD-10-CM | POA: Insufficient documentation

## 2014-02-11 NOTE — Assessment & Plan Note (Signed)
Check FLP, given lab slip

## 2014-02-11 NOTE — Assessment & Plan Note (Signed)
Keflex works well, flonase, mucinex

## 2014-02-11 NOTE — Progress Notes (Signed)
Patient ID: Debra [name], female   DOB: [birth year], 63 y.o.   MRN: [id number]   Subjective:    Patient ID: Debra [name], female    DOB: 1950-12-11, 63 y.o.   MRN: [id number]  Patient presents for Sinus infection and Advice  patient here with multiple concerns. She is due for fasting lipid panel she's also due for bone density. She was seen by orthopedics secondary to a fracture of her right foot she has a bony prominence that she would like me to evaluate she was told by orthopedics that it would have to be cut out as it was likely a bony cyst. She did not want to have any surgical intervention. It has not grown in size and occasionally gets inflamed and causes some tenderness. She was told by orthopedics based on her x-rays that she had osteopenia needed a bone density she's not had a bone density in approximately 5 years.  She's also noticed a cystic lesion in the middle of her back or the past couple months it is here taking as it is near her bra. She's not had any drainage from the lesion.  She's had sinusitis for the past 2 weeks. Nasal drainage as well as pressure. She's been using over-the-counter medications as well as nasal saline with minimal improvement  She's concerned about her weight she is unable to lose weight as she has difficulty finding foods that she's able tolerate because of her-year-old bowel syndrome. If she eats fruits or vegetables she has severe diarrhea. She's been evaluated by gastroenterology multiple times and he basically told her that she has to live with it. She tries to exercise some but is limited due to some arthritis. She was considering coming off her Wellbutrin so that she could use one of the dietary supplements however her husband has upcoming surgery for prostate cancer therefore we decided to hold on this  She's also had multiple episodes of bilateral joint pain in her knees and hands her family has a strong history of rheumatoid arthritis she did have  an ESR that was done which was negative. She's not had a rheumatoid factor done herself. Her hands are getting more severe to the point where she has difficulty closing the right hand due to swelling and has pain during the day. She works as a Pharmacist, hospital.   Review Of Systems:  GEN- denies fatigue, fever, weight loss,weakness, recent illness HEENT- denies eye drainage, change in vision,+ nasal discharge, CVS- denies chest pain, palpitations RESP- denies SOB, cough, wheeze ABD- denies N/V, change in stools, abd pain GU- denies dysuria, hematuria, dribbling, incontinence MSK- + joint pain, muscle aches, injury Neuro- denies headache, dizziness, syncope, seizure activity       Objective:    BP 136/68  Pulse 66  Temp(Src) 98.2 F (36.8 C)  Resp 18  Ht $R'5\' 1"'bD$  (1.549 m)  Wt 187 lb (84.823 kg)  BMI 35.35 kg/m2 GEN- NAD, alert and oriented x3 HEENT- PERRL, EOMI, non injected sclera, pink conjunctiva, MMM, oropharynx mild injection, TM clear bilat no effusion,  + maxillary sinus tenderness, inflammed turbinates,  Nasal drainage  Neck- Supple, no LAD CVS- RRR, no murmur RESP-CTAB MSK- swelling of MIP and PIP Right hand, unable to make tight fist, prominent bony lesion midfoot- NT of right foot  Skin- dime size cyst with erythema mid thoracic region of back- pus moderate amount expressed without use of scalpel EXT- No edema Pulses- Radial 2+  Assessment & Plan:      Problem List Items Addressed This Visit   Pain in joint, multiple sites     Check RF and, ANA CRP, send to rheumatology, strong family history of RA    Osteopenia     Bone density    Relevant Orders      DG Bone Density   Obesity, unspecified     She has many dietary challenges because of her GI status. I will refer her to a nutritionist    Relevant Orders      Amb ref to Medical Nutrition Therapy-MNT   Infected epidermoid cyst      Cyst was expressed at the bedside. She does not want to have it  surgically removed. Keflex warm compress    Relevant Medications      cephALEXin (KEFLEX) capsule   High cholesterol     Check FLP, given lab slip    Relevant Orders      Lipid panel   Acute sinusitis     Keflex works well, flonase, mucinex     Other Visit Diagnoses   Screening cholesterol level    -  Primary    Pain, joint, multiple sites        Relevant Orders       ANA       Rheumatoid factor       C-reactive protein       Ambulatory referral to Rheumatology    Other and unspecified hyperlipidemia        Need for prophylactic vaccination against Streptococcus pneumoniae (pneumococcus)        Relevant Orders - History of Pneumonia and Pleurisy, warrents pneumovaccine- High risk pt       Pneumococcal polysaccharide vaccine 23-valent greater than or equal to 2yo subcutaneous/IM (Completed)       Note will plan in the future to taper down off wellbutrin, was pu ton 5 years ago when having some problems with her son, plan to decrease to $RemoveBef'75mg'FNDhbeYqDk$  daily, then 37.$RemoveBefore'5mg'XudnurLRtNOlT$  daily, note she currently on takes $Remove'150mg'YdyjviT$  once a day     Note: This dictation was prepared with Dragon dictation along with smaller phrase technology. Any transcriptional errors that result from this process are unintentional.

## 2014-02-11 NOTE — Assessment & Plan Note (Signed)
She has many dietary challenges because of her GI status. I will refer her to a nutritionist

## 2014-02-11 NOTE — Assessment & Plan Note (Addendum)
Check RF and, ANA CRP, send to rheumatology, strong family history of RA

## 2014-02-11 NOTE — Assessment & Plan Note (Signed)
Bone density

## 2014-02-11 NOTE — Assessment & Plan Note (Signed)
Cyst was expressed at the bedside. She does not want to have it surgically removed. Keflex warm compress

## 2014-02-15 ENCOUNTER — Encounter: Payer: Self-pay | Admitting: *Deleted

## 2014-03-05 ENCOUNTER — Other Ambulatory Visit: Payer: Self-pay | Admitting: Family Medicine

## 2014-03-05 MED ORDER — PROPRANOLOL HCL ER BEADS 80 MG PO CP24: 80.0000 mg | ORAL_CAPSULE | Freq: Every day | ORAL | Status: DC

## 2014-03-05 NOTE — Telephone Encounter (Signed)
Rx Refilled  

## 2014-03-06 LAB — LIPID PANEL
Cholesterol: 226 mg/dL — ABNORMAL HIGH (ref 0–200)
HDL: 56 mg/dL (ref >39)
LDL CALC: 136 mg/dL — AB (ref 0–99)
Total CHOL/HDL Ratio: 4 Ratio
Triglycerides: 169 mg/dL — ABNORMAL HIGH (ref <150)
VLDL: 34 mg/dL (ref 0–40)

## 2014-03-06 LAB — RHEUMATOID FACTOR: Rhuematoid fact SerPl-aCnc: <10 IU/mL (ref <=14)

## 2014-03-06 LAB — ANA: ANA: NEGATIVE

## 2014-03-06 LAB — C-REACTIVE PROTEIN

## 2014-03-07 ENCOUNTER — Encounter: Payer: Self-pay | Admitting: *Deleted

## 2014-03-08 ENCOUNTER — Other Ambulatory Visit: Payer: Self-pay | Admitting: *Deleted

## 2014-03-08 MED ORDER — PRAVASTATIN SODIUM 40 MG PO TABS: 40.0000 mg | ORAL_TABLET | Freq: Every day | ORAL | Status: DC

## 2014-03-08 NOTE — Telephone Encounter (Signed)
Per orders noted on labs, medication sent to pharmacy.   Call placed to patient and patient made aware. 

## 2014-03-14 ENCOUNTER — Ambulatory Visit: Payer: BC Managed Care – PPO | Admitting: Dietician

## 2014-04-10 ENCOUNTER — Ambulatory Visit: Payer: BC Managed Care – PPO | Admitting: Dietician

## 2014-05-16 ENCOUNTER — Other Ambulatory Visit: Payer: Self-pay | Admitting: Family Medicine

## 2014-05-16 MED ORDER — LEVOTHYROXINE SODIUM 100 MCG PO TABS: 100.0000 ug | ORAL_TABLET | Freq: Every day | ORAL | Status: DC

## 2014-05-16 NOTE — Telephone Encounter (Signed)
Rx Refilled  

## 2014-06-13 ENCOUNTER — Encounter: Payer: Self-pay | Admitting: Family Medicine

## 2014-06-13 ENCOUNTER — Ambulatory Visit (INDEPENDENT_AMBULATORY_CARE_PROVIDER_SITE_OTHER): Payer: BC Managed Care – PPO | Admitting: Family Medicine

## 2014-06-13 VITALS — BP 128/70 | HR 64 | Temp 98.0°F | Resp 12 | Ht 62.5 in | Wt 189.0 lb

## 2014-06-13 DIAGNOSIS — F3289 Other specified depressive episodes: Secondary | ICD-10-CM

## 2014-06-13 DIAGNOSIS — E7800 Pure hypercholesterolemia, unspecified: Secondary | ICD-10-CM

## 2014-06-13 DIAGNOSIS — F32A Depression, unspecified: Secondary | ICD-10-CM

## 2014-06-13 DIAGNOSIS — F329 Major depressive disorder, single episode, unspecified: Secondary | ICD-10-CM

## 2014-06-13 DIAGNOSIS — D509 Iron deficiency anemia, unspecified: Secondary | ICD-10-CM

## 2014-06-13 DIAGNOSIS — I10 Essential (primary) hypertension: Secondary | ICD-10-CM

## 2014-06-13 LAB — LIPID PANEL
Cholesterol: 206 mg/dL — ABNORMAL HIGH (ref 0–200)
HDL: 52 mg/dL (ref >39)
LDL Cholesterol: 117 mg/dL — ABNORMAL HIGH (ref 0–99)
TRIGLYCERIDES: 183 mg/dL — AB (ref <150)
Total CHOL/HDL Ratio: 4 Ratio
VLDL: 37 mg/dL (ref 0–40)

## 2014-06-13 LAB — CBC WITH DIFFERENTIAL/PLATELET
Basophils Absolute: 0.1 10*3/uL (ref 0.0–0.1)
Basophils Relative: 1 % (ref 0–1)
Eosinophils Absolute: 0.2 10*3/uL (ref 0.0–0.7)
Eosinophils Relative: 4 % (ref 0–5)
HCT: 41.8 % (ref 36.0–46.0)
HEMOGLOBIN: 14.2 g/dL (ref 12.0–15.0)
LYMPHS ABS: 2 10*3/uL (ref 0.7–4.0)
LYMPHS PCT: 34 % (ref 12–46)
MCH: 30.2 pg (ref 26.0–34.0)
MCHC: 34 g/dL (ref 30.0–36.0)
MCV: 88.9 fL (ref 78.0–100.0)
MONOS PCT: 8 % (ref 3–12)
Monocytes Absolute: 0.5 10*3/uL (ref 0.1–1.0)
NEUTROS ABS: 3.2 10*3/uL (ref 1.7–7.7)
NEUTROS PCT: 53 % (ref 43–77)
Platelets: 254 10*3/uL (ref 150–400)
RBC: 4.7 MIL/uL (ref 3.87–5.11)
RDW: 13.8 % (ref 11.5–15.5)
WBC: 6 10*3/uL (ref 4.0–10.5)

## 2014-06-13 LAB — COMPREHENSIVE METABOLIC PANEL
ALBUMIN: 4.3 g/dL (ref 3.5–5.2)
ALT: 24 U/L (ref 0–35)
AST: 17 U/L (ref 0–37)
Alkaline Phosphatase: 54 U/L (ref 39–117)
BUN: 19 mg/dL (ref 6–23)
CHLORIDE: 105 meq/L (ref 96–112)
CO2: 25 mEq/L (ref 19–32)
Calcium: 9.4 mg/dL (ref 8.4–10.5)
Creat: 0.93 mg/dL (ref 0.50–1.10)
GLUCOSE: 93 mg/dL (ref 70–99)
Potassium: 4.3 mEq/L (ref 3.5–5.3)
Sodium: 139 mEq/L (ref 135–145)
TOTAL PROTEIN: 6.8 g/dL (ref 6.0–8.3)
Total Bilirubin: 0.6 mg/dL (ref 0.2–1.2)

## 2014-06-13 LAB — IRON AND TIBC
%SAT: 21 % (ref 20–55)
Iron: 77 ug/dL (ref 42–145)
TIBC: 369 ug/dL (ref 250–470)
UIBC: 292 ug/dL (ref 125–400)

## 2014-06-13 MED ORDER — BUPROPION HCL ER (XL) 300 MG PO TB24: 300.0000 mg | ORAL_TABLET | Freq: Every day | ORAL | Status: DC

## 2014-06-13 NOTE — Assessment & Plan Note (Signed)
Check levels, may need iron infusion secondary to intolerance to oral medication

## 2014-06-13 NOTE — Progress Notes (Signed)
Patient ID: Debra [name], female   DOB: [birth year], 63 y.o.   MRN: [id number]   Subjective:    Patient ID: Debra [name], female    DOB: Mar 17, 1951, 63 y.o.   MRN: [id number]  Patient presents for 4 month F/U  Here to follow chronic medical problems. She's been seen by rheumatology with a diagnosis of osteoarthritis. There been no medication changes that she is using some omega-3 as well as tumeric.  Hyperlipidemia she change her dietary habits she increased her pravastatin is due for repeat lipid panel.  Deficiency anemia she noticed when she stopped her iron tablets that her bowels improved in that her appetite also improved. She was to hold on nutrition referral. She's due for repeat iron level as well as CBC and she's been off her iron tablets for past few months. She will like to look into her infusions if she does need replacement since she did not tolerate the oral very well.  Impression she's been on Wellbutrin for quite some time for stress and depression. She will like to move to the extended release    Review Of Systems:  GEN- denies fatigue, fever, weight loss,weakness, recent illness HEENT- denies eye drainage, change in vision, nasal discharge, CVS- denies chest pain, palpitations RESP- denies SOB, cough, wheeze ABD- denies N/V, change in stools, abd pain GU- denies dysuria, hematuria, dribbling, incontinence MSK- +joint pain, muscle aches, injury Neuro- denies headache, dizziness, syncope, seizure activity       Objective:    BP 128/70  Pulse 64  Temp(Src) 98 F (36.7 C) (Oral)  Resp 12  Ht 5' 2.5" (1.588 m)  Wt 189 lb (85.73 kg)  BMI 34.00 kg/m2 GEN- NAD, alert and oriented x3 HEENT- PERRL, EOMI, non injected sclera, pink conjunctiva, MMM, oropharynx clear Neck- Supple, no thyromegaly CVS- RRR, no murmur RESP-CTAB EXT- No edema Psych- normal affect and mood Pulses- Radial, DP- 2+        Assessment & Plan:      Problem List Items Addressed This  Visit   Iron deficiency anemia - Primary   Relevant Orders      CBC with Differential      Iron and TIBC   High cholesterol   Relevant Orders      Comprehensive metabolic panel      Lipid panel      Note: This dictation was prepared with Dragon dictation along with smaller phrase technology. Any transcriptional errors that result from this process are unintentional.

## 2014-06-13 NOTE — Assessment & Plan Note (Signed)
Will change to Wellbutrin 300 mg once a day extended release

## 2014-06-13 NOTE — Assessment & Plan Note (Signed)
Check lipids today, pravastatin

## 2014-06-13 NOTE — Assessment & Plan Note (Signed)
Well-controlled on current meds 

## 2014-06-13 NOTE — Patient Instructions (Signed)
Wellbutrin changed to extended release We will call with lab results F/U 4 months or as needed

## 2014-06-14 ENCOUNTER — Encounter: Payer: Self-pay | Admitting: *Deleted

## 2014-06-22 ENCOUNTER — Other Ambulatory Visit: Payer: Self-pay | Admitting: *Deleted

## 2014-06-22 MED ORDER — PROPRANOLOL HCL ER BEADS 80 MG PO CP24: 80.0000 mg | ORAL_CAPSULE | Freq: Every day | ORAL | Status: DC

## 2014-06-22 NOTE — Telephone Encounter (Signed)
Refill appropriate and filled per protocol. 

## 2014-07-23 ENCOUNTER — Ambulatory Visit: Payer: BC Managed Care – PPO | Admitting: Family Medicine

## 2014-07-24 ENCOUNTER — Ambulatory Visit (INDEPENDENT_AMBULATORY_CARE_PROVIDER_SITE_OTHER): Payer: BC Managed Care – PPO | Admitting: Family Medicine

## 2014-07-24 ENCOUNTER — Encounter: Payer: Self-pay | Admitting: Family Medicine

## 2014-07-24 VITALS — BP 126/64 | HR 62 | Temp 98.0°F | Resp 14 | Ht 63.0 in | Wt 189.0 lb

## 2014-07-24 DIAGNOSIS — M129 Arthropathy, unspecified: Secondary | ICD-10-CM

## 2014-07-24 DIAGNOSIS — M1990 Unspecified osteoarthritis, unspecified site: Secondary | ICD-10-CM | POA: Insufficient documentation

## 2014-07-24 NOTE — Patient Instructions (Signed)
Referral for second opinion  F/U as previous

## 2014-07-24 NOTE — Assessment & Plan Note (Signed)
Will send for second opinion with guarding her arthritis I was in the ultrasound as well as the lab results based on the workup appears this is osteoarthritis. At this time she has resumed her diclofenac 75 mg once a day which does help her pain she is also doing cherry juice and omega 3  for inflammation

## 2014-07-24 NOTE — Progress Notes (Signed)
Patient ID: Debra [name], female   DOB: May 05, 1951, 63 y.o.   MRN: [id number]   Subjective:    Patient ID: Debra [name], female    DOB: 1951/11/05, 63 y.o.   MRN: [id number]  Patient presents for Discuss Arthritis  patient here to followup arthritis. She has multiple joint pain more consistent with osteoporosis however she is a strong family history of rheumatoid arthritis. Her previous rheumatoid and ESR levels were negative however I sent her to rheumatology for further evaluation as her pain was worsening. She had a set of blood work which was also normal at our office but she did have an ultrasound done which showed positive rheumatoid changes therefore on the second visit she was given a diagnosis of rheumatoid arthritis which is a little bit confused about. All her family members have actually have positive rheumatoid factors and positive signs on imaging. She was prescribed plaquenil which she does not want to take secondary to side effects and effectiveness of the base interactional diagnoses is not definite. She's requesting a second opinion regarding her arthritis.    Review Of Systems:  GEN- denies fatigue, fever, weight loss,weakness, recent illness HEENT- denies eye drainage, change in vision, nasal discharge, CVS- denies chest pain, palpitations RESP- denies SOB, cough, wheeze MSK- + joint pain, muscle aches, injury Neuro- denies headache, dizziness, syncope, seizure activity       Objective:    BP 126/64  Pulse 62  Temp(Src) 98 F (36.7 C) (Oral)  Resp 14  Ht $R'5\' 3"'AV$  (1.6 m)  Wt 189 lb (85.73 kg)  BMI 33.49 kg/m2 GEN- NAD, alert and oriented x3 MSK- swelling of MIP and PIP Right > Left hand, unable to make tight fist, no swan deformity        Assessment & Plan:      Problem List Items Addressed This Visit   Arthritis - Primary     Will send for second opinion with guarding her arthritis I was in the ultrasound as well as the lab results based on the workup  appears this is osteoarthritis. At this time she has resumed her diclofenac 75 mg once a day which does help her pain she is also doing cherry juice and omega 3  for inflammation        Note: This dictation was prepared with Dragon dictation along with smaller phrase technology. Any transcriptional errors that result from this process are unintentional.

## 2014-09-12 ENCOUNTER — Other Ambulatory Visit: Payer: Self-pay | Admitting: Family Medicine

## 2014-09-12 DIAGNOSIS — Z1231 Encounter for screening mammogram for malignant neoplasm of breast: Secondary | ICD-10-CM

## 2014-09-21 ENCOUNTER — Ambulatory Visit (INDEPENDENT_AMBULATORY_CARE_PROVIDER_SITE_OTHER): Payer: BC Managed Care – PPO | Admitting: Family Medicine

## 2014-09-21 VITALS — BP 126/72 | HR 78 | Temp 98.0°F | Resp 14

## 2014-09-21 DIAGNOSIS — E669 Obesity, unspecified: Secondary | ICD-10-CM

## 2014-09-21 DIAGNOSIS — F411 Generalized anxiety disorder: Secondary | ICD-10-CM

## 2014-09-21 DIAGNOSIS — F329 Major depressive disorder, single episode, unspecified: Secondary | ICD-10-CM

## 2014-09-21 DIAGNOSIS — K5792 Diverticulitis of intestine, part unspecified, without perforation or abscess without bleeding: Secondary | ICD-10-CM

## 2014-09-21 DIAGNOSIS — F32A Depression, unspecified: Secondary | ICD-10-CM

## 2014-09-21 DIAGNOSIS — K589 Irritable bowel syndrome without diarrhea: Secondary | ICD-10-CM

## 2014-09-21 MED ORDER — LORAZEPAM 0.5 MG PO TABS: 0.2500 mg | ORAL_TABLET | Freq: Every day | ORAL | Status: DC

## 2014-09-21 MED ORDER — METRONIDAZOLE 500 MG PO TABS: 500.0000 mg | ORAL_TABLET | Freq: Three times a day (TID) | ORAL | Status: DC

## 2014-09-21 MED ORDER — LORAZEPAM 0.5 MG PO TABS: 0.5000 mg | ORAL_TABLET | Freq: Two times a day (BID) | ORAL | Status: DC

## 2014-09-21 MED ORDER — CIPROFLOXACIN HCL 500 MG PO TABS: 500.0000 mg | ORAL_TABLET | Freq: Two times a day (BID) | ORAL | Status: DC

## 2014-09-21 NOTE — Patient Instructions (Signed)
Ativan increased to 1 tablet twice a day  Cipro and flagyl antibiotics Bentyl for spasm Referral to nutritionist at [facility]

## 2014-09-23 ENCOUNTER — Encounter: Payer: Self-pay | Admitting: Family Medicine

## 2014-09-23 DIAGNOSIS — F411 Generalized anxiety disorder: Secondary | ICD-10-CM | POA: Insufficient documentation

## 2014-09-23 DIAGNOSIS — K5792 Diverticulitis of intestine, part unspecified, without perforation or abscess without bleeding: Secondary | ICD-10-CM | POA: Insufficient documentation

## 2014-09-23 NOTE — Assessment & Plan Note (Signed)
Trial of bentyl as needed for cramping and bloating after flare of diveritucliits clears

## 2014-09-23 NOTE — Assessment & Plan Note (Signed)
Referral to new nutritionist

## 2014-09-23 NOTE — Assessment & Plan Note (Addendum)
Add increased 1mg  ativan BID for anxiety symptoms Continue wellbutrin
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
## 2014-09-23 NOTE — Assessment & Plan Note (Signed)
Treat as residual symptoms with short course of cipro and flagyl for 7 days

## 2014-09-24 ENCOUNTER — Telehealth: Payer: Self-pay | Admitting: Family Medicine

## 2014-09-24 ENCOUNTER — Other Ambulatory Visit: Payer: Self-pay | Admitting: Family Medicine

## 2014-09-24 MED ORDER — DICYCLOMINE HCL 20 MG PO TABS: 20.0000 mg | ORAL_TABLET | Freq: Three times a day (TID) | ORAL | Status: DC

## 2014-09-24 MED ORDER — BUPROPION HCL ER (XL) 300 MG PO TB24: 300.0000 mg | ORAL_TABLET | Freq: Every day | ORAL | Status: DC

## 2014-09-24 NOTE — Telephone Encounter (Signed)
Noted in last office visit that trial would be started.   MD please advise.

## 2014-09-24 NOTE — Telephone Encounter (Signed)
Medication sent, start after she completes antibiotics

## 2014-09-24 NOTE — Telephone Encounter (Signed)
Call placed to patient. LMTRC.  

## 2014-09-24 NOTE — Telephone Encounter (Signed)
Med sent to pharm 

## 2014-09-24 NOTE — Telephone Encounter (Signed)
Patient says she was supposed to get rx for bentyl she says on Friday, and it was not at her pharmacy which is [facility] [place]  Please call her back with questions at [phone]

## 2014-09-25 NOTE — Telephone Encounter (Signed)
Call placed to patient. LMTRC.  

## 2014-09-25 NOTE — Telephone Encounter (Signed)
Patient came to office requesting Bentyl prescription.   Advised that prescription was sent to pharmacy.

## 2014-10-17 ENCOUNTER — Encounter: Payer: Self-pay | Admitting: Nutrition

## 2014-10-17 ENCOUNTER — Encounter: Payer: BC Managed Care – PPO | Attending: Family Medicine | Admitting: Nutrition

## 2014-10-17 VITALS — Ht 65.0 in | Wt 188.2 lb

## 2014-10-17 DIAGNOSIS — K5730 Diverticulosis of large intestine without perforation or abscess without bleeding: Secondary | ICD-10-CM

## 2014-10-17 NOTE — Progress Notes (Signed)
  Medical Nutrition Therapy:  Appt start time: 0800 end time:  0900.   Assessment:  Primary concerns today: Diverticulitis and obesity. BMI 31. She notes she had 12 inches of her colon removed. Has history of anemia and hiatal hernia and anxiety. 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 Lives with her husband. Walks 30 minutes or longer daily for exercise. Foods high in fiber cause severe gas, bloating and diarrhea.   Has been anemic in the past and craves ice at times. She reports seeing a therapist in the past for her anxiety. Feels Wellbutrin isn't helping. Her nerves effect her GI issues as well she thinks.   Preferred Learning Style: Auditory    No preference indicated   Learning Readiness:   Ready  Change in progress  MEDICATIONS: see list   DIETARY INTAKE:  24-hr recall:  B ( AM): 1 egg and [place] bacon or [place] sausage, or cheese sandwich;  Getta-sausage and oatmeal, Diet coke. Snk ( AM): yogurt or banana or some crackers  L ( PM): lean cuisine or frozen meals, Diet coke Snk ( PM): Ice tea or hot tea or chocolate candy bar D ( PM): Hamburgers and hot dog and baked fries and toss salad: -carrots,grape tomatoes-hydroponic lettuce and romaine. Water or decaf tea Snk ( PM): weight watchers ice cream  Beverages: water, unsweet tea  Usual physical activity: walks daily 30 minutes  Estimated energy needs: 1500 calories 170 g carbohydrates 112 g protein 42 g fat  Progress Towards Goal(s):  In progress.   Nutritional Diagnosis:  NB-1.1 Food and nutrition-related knowledge deficit As related to diverticulitis.  As evidenced by having 12 inches of her colon removed and having diverticula flare ups with gas, diarrhea and severe bloating..   Food and nutrition related knowledge deficit related to obesity as evidenced by BMI of 31 and diet excessive in calories.    Intervention:  Nutrition counseling for diverticulitis, iron rich foods, low/high fiber diet  during flare ups and slowly increasing fiber as tolerated.  Plan: 1. Make an appointment with her therapist to work on addressing her anxiety and stress issues . 2. Avoid diet sodas and drinking out of a straw due to gas production/carbonation. 3. Keep a food journal and record GI symptoms on daily sheet. 4. Consider trying yoga and medications to help with stress and anxiety that are impacting  GI problems. 5. Avoid caffeine and trigger foods identified in food journal. 6. Cut down on cabbage, broccoli and other gas producing that cause irritation to GI tract. 7. Avoid spicy foods. 8. Talk to provider about prescribing medications for reflux/indigestion.  Teaching Method Utilized:  Visual Auditory Hands on  Handouts given during visit include:  Low fiber diet/high fiber diet depending on symptoms and tolerance  Iron rich good foods.   Barriers to learning/adherence to lifestyle change: none  Demonstrated degree of understanding via:  Teach Back   Monitoring/Evaluation:  Dietary intake, exercise, meal planning  GI symptoms, and body weight in 1 month(s).

## 2014-10-17 NOTE — Patient Instructions (Signed)
Make an appointment with her therapist to work on addressing her anxiety and stress issues . 2. Avoid diet sodas and drinking out of a straw due to gas production/carbonation. 3. Keep a food journal and record GI symptoms on daily sheet. 4. Consider trying yoga and medications to help with stress and anxiety that are impacting  GI problems. 5. Avoid caffeine and trigger foods identified in food journal. 6. Cut down on cabbage, broccoli and other gas producing that cause irritation to GI tract. 7. Avoid spicy foods. 8. Talk to provider about prescribing medications for reflux/indigestion.

## 2014-10-24 ENCOUNTER — Other Ambulatory Visit: Payer: Self-pay | Admitting: Family Medicine

## 2014-10-24 MED ORDER — DICLOFENAC SODIUM 75 MG PO TBEC: 75.0000 mg | DELAYED_RELEASE_TABLET | Freq: Every day | ORAL | Status: DC

## 2014-10-24 NOTE — Telephone Encounter (Signed)
Med sent to pharm 

## 2014-11-05 ENCOUNTER — Ambulatory Visit (HOSPITAL_COMMUNITY)
Admission: RE | Admit: 2014-11-05 | Discharge: 2014-11-05 | Disposition: A | Discharge status: Home / Self Care | Payer: BC Managed Care – PPO | Source: Ambulatory Visit | Attending: Family Medicine | Admitting: Family Medicine

## 2014-11-05 DIAGNOSIS — Z1231 Encounter for screening mammogram for malignant neoplasm of breast: Secondary | ICD-10-CM | POA: Diagnosis present

## 2014-11-14 ENCOUNTER — Encounter: Payer: BC Managed Care – PPO | Attending: Family Medicine | Admitting: Nutrition

## 2014-11-14 VITALS — Ht 65.0 in | Wt 188.0 lb

## 2014-11-14 DIAGNOSIS — K5732 Diverticulitis of large intestine without perforation or abscess without bleeding: Secondary | ICD-10-CM

## 2014-11-14 NOTE — Progress Notes (Signed)
Medical Nutrition Therapy:  Appt start time: 1600 end time:  1500   Assessment:  Primary concerns today: Follow up Diverticulitis and obesity. BMI 31. Has learned a lot about the foods that irritate her GI. She has kept a food journal and has figured out what foods cause bloating, diarrhea and reflux irritation. She figured out she can't handle using straws. This has been extremely helpful she notes as she feels much better emotionally and physically. Continues to walk. Hasn't gone to therapist yet but will after the New Year. Wants to try some other medication for her nerves, anxiety or depression to see if that will help better than the Ativan that she has been on for over 20 years. Is trying to relax better and not let things worry or emotionally bother her as much. Started taking an over the counter reflux medication and has found it to be helpful. History of Hiatal Hernia along with reflux.   Preferred Learning Style: Auditory    No preference indicated   Learning Readiness:   Ready  Change in progress  MEDICATIONS: see list   DIETARY INTAKE:  24-hr recall:  Breakfast: PImento cheese and crackers, Lots of Hot Tea and cherry juice 2 oz. Eating more fish and has tried cutting out foods that cause bloating, diarrhea or other GI issues. She eats 2-3 meals per day. Does eat some higher fat foods at times. Drinks a little Diet Coke now-2-4 oz instead of larger amounts. Trying to avoid caffeine. Has chocolate at times for stress release. Making improvements in food choices slowly and it is helping her GI symptoms tremendously. She feels better and that reduces her overall emotional stress also.  Usual physical activity: walks daily 30 minutes  Estimated energy needs: 1500 calories 170 g carbohydrates 112 g protein 42 g fat  Progress Towards Goal(s):  In progress.   Nutritional Diagnosis:  NB-1.1 Food and nutrition-related knowledge deficit As related to diverticulitis.  As  evidenced by having 12 inches of her colon removed and having diverticula flare ups with gas, diarrhea and severe bloating..   Food and nutrition related knowledge deficit related to obesity as evidenced by BMI of 31 and diet excessive in calories.    Intervention:  Nutrition counseling for diverticulitis, iron rich foods, low/high fiber diet during flare ups and slowly increasing fiber as tolerated.  Plan: 1. Make an appointment with her therapist to work on addressing her anxiety and stress issues .  Eat three meals a day and do not skip meals. Try eating yogurt, cheese/crackers or cereal bar with lunch to reduce GI distress and getting overly hungry by dinner time. 2. Avoid diet sodas and drinking out of a straw due to gas production/carbonation. 3.  Continue keeping a food journal and record GI symptoms on daily sheet to address patterns and food triggers. 4. Consider trying yoga and medications to help with stress and anxiety that are impacting  GI problems. 5. Avoid caffeine and trigger foods identified in food journal. 6. Cut down on cabbage, broccoli and other gas producing that cause irritation to GI tract. 7. Avoid spicy foods. 8. Talk to provider about prescribing medications for reflux/indigestion.  Teaching Method Utilized:  Visual Auditory Hands on  Handouts given during visit include:  Low fiber diet/high fiber diet depending on symptoms and tolerance  Iron rich good foods.   Barriers to learning/adherence to lifestyle change: none  Demonstrated degree of understanding via:  Teach Back   Monitoring/Evaluation:  Dietary intake, exercise, meal  planning  GI symptoms, and body weight in 1-2 month(s).

## 2014-11-14 NOTE — Patient Instructions (Signed)
Plan: 1. Make an appointment with her therapist to work on addressing her anxiety and stress issues .  Eat three meals a day and do not skip meals. Try eating yogurt, cheese/crackers or cereal bar with lunch to reduce GI distress and getting overly hungry by dinner time. 2. Avoid diet sodas and drinking out of a straw due to gas production/carbonation. 3.  Continue keeping a food journal and record GI symptoms on daily sheet to address patterns and food triggers. 4. Consider trying yoga and medications to help with stress and anxiety that are impacting  GI problems. 5. Avoid caffeine and trigger foods identified in food journal. 6. Cut down on cabbage, broccoli and other gas producing that cause irritation to GI tract. 7. Avoid spicy foods. 8. Talk to provider about prescribing medications for reflux/indigestion

## 2014-11-20 ENCOUNTER — Encounter: Payer: Self-pay | Admitting: Family Medicine

## 2014-11-20 ENCOUNTER — Telehealth: Payer: Self-pay | Admitting: Family Medicine

## 2014-11-20 DIAGNOSIS — E785 Hyperlipidemia, unspecified: Secondary | ICD-10-CM

## 2014-11-20 DIAGNOSIS — E039 Hypothyroidism, unspecified: Secondary | ICD-10-CM

## 2014-11-20 MED ORDER — LEVOTHYROXINE SODIUM 100 MCG PO TABS
100.0000 ug | ORAL_TABLET | Freq: Every day | ORAL | Status: DC
Start: 2014-11-20 — End: 2015-06-11

## 2014-11-20 NOTE — Telephone Encounter (Signed)
Okay to call in for labs, needs fasting Lipid as well

## 2014-11-20 NOTE — Telephone Encounter (Signed)
Last TSH one year ago.  One month refill sent.  Pt needs TSH.

## 2014-11-20 NOTE — Telephone Encounter (Signed)
Left message for pt to call back.  Needs to come in for lab work fasting

## 2014-11-22 ENCOUNTER — Encounter: Payer: Self-pay | Admitting: Family Medicine

## 2014-11-22 NOTE — Telephone Encounter (Signed)
Unable to reach patient.  Sent letter to come for fasting labs

## 2014-11-26 ENCOUNTER — Other Ambulatory Visit: Payer: BC Managed Care – PPO

## 2014-11-26 DIAGNOSIS — E785 Hyperlipidemia, unspecified: Secondary | ICD-10-CM

## 2014-11-26 DIAGNOSIS — E039 Hypothyroidism, unspecified: Secondary | ICD-10-CM

## 2014-11-26 LAB — LIPID PANEL
CHOL/HDL RATIO: 3.9 ratio
Cholesterol: 191 mg/dL (ref 0–200)
HDL: 49 mg/dL (ref >39)
LDL CALC: 117 mg/dL — AB (ref 0–99)
TRIGLYCERIDES: 125 mg/dL (ref <150)
VLDL: 25 mg/dL (ref 0–40)

## 2014-11-27 ENCOUNTER — Encounter: Payer: Self-pay | Admitting: Family Medicine

## 2014-11-27 ENCOUNTER — Ambulatory Visit (INDEPENDENT_AMBULATORY_CARE_PROVIDER_SITE_OTHER): Payer: BC Managed Care – PPO | Admitting: Family Medicine

## 2014-11-27 VITALS — BP 128/80 | HR 76 | Temp 98.3°F | Resp 14 | Ht 65.0 in | Wt 186.0 lb

## 2014-11-27 DIAGNOSIS — E7800 Pure hypercholesterolemia, unspecified: Secondary | ICD-10-CM

## 2014-11-27 DIAGNOSIS — M7918 Myalgia, other site: Secondary | ICD-10-CM | POA: Insufficient documentation

## 2014-11-27 DIAGNOSIS — M791 Myalgia: Secondary | ICD-10-CM

## 2014-11-27 DIAGNOSIS — F32A Depression, unspecified: Secondary | ICD-10-CM

## 2014-11-27 DIAGNOSIS — F411 Generalized anxiety disorder: Secondary | ICD-10-CM

## 2014-11-27 DIAGNOSIS — F329 Major depressive disorder, single episode, unspecified: Secondary | ICD-10-CM

## 2014-11-27 DIAGNOSIS — I10 Essential (primary) hypertension: Secondary | ICD-10-CM

## 2014-11-27 LAB — TSH: TSH: 1.281 u[IU]/mL (ref 0.350–4.500)

## 2014-11-27 MED ORDER — PROPRANOLOL HCL ER BEADS 80 MG PO CP24: 80.0000 mg | ORAL_CAPSULE | Freq: Every day | ORAL | Status: DC

## 2014-11-27 NOTE — Assessment & Plan Note (Signed)
For now continue wellbutrin, she has been on for quite some time, so a long taper would have to occur, other option is to add an SSRI OR SNRI to it in low dose as an adjunct, consider Lexapro 5mg  Continue xanax

## 2014-11-27 NOTE — Assessment & Plan Note (Signed)
LDL up a little TG much improved, will decrease statin to see if this helps leg, advised pt best to d/c all together for a few weeks to see if she notices a difference,

## 2014-11-27 NOTE — Progress Notes (Signed)
Patient ID: Debra [name], female   DOB: June 27, 1951, 63 y.o.   MRN: [id number]   Subjective:    Patient ID: Debra [name], female    DOB: 30-Sep-1951, 64 y.o.   MRN: [id number]  Patient presents for Medication Review and Discuss Labs  patient here to follow-up medications. She also had a thyroid lab and cholesterol done. Her thyroid was within normal limits. She states that her legs hurt when she is walking appeals that she is not having problems when she is working on flat surface sometimes she gets aching in her legs at nighttime and she wondered if it was due to her cholesterol medication.  She is being followed by the nutritionist she has multiple food adverse these because of her Irritable bowels and then she is also trying to be strict regarding her weight. She has had some improvements with chronic abdominal pain and bowels since seen the nutritionist. She also states that the Bentyl works very well but she does not have to take it on a regular basis.   Mood she's currently on Wellbutrin which she has been on for about 10 years. 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 Initially she wanted to try something different than the Wellbutrin but then states that the semester is now over and she will see how she does with her new set of children before she changes her medication. She does have her benzo but uses this very sparingly.    Review Of Systems:  GEN- denies fatigue, fever, weight loss,weakness, recent illness HEENT- denies eye drainage, change in vision, nasal discharge, CVS- denies chest pain, palpitations RESP- denies SOB, cough, wheeze ABD- denies N/V, change in stools,+ abd pain GU- denies dysuria, hematuria, dribbling, incontinence MSK- + joint pain, muscle aches, injury Neuro- denies headache, dizziness, syncope, seizure activity       Objective:    BP 128/80 mmHg  Pulse 76  Temp(Src) 98.3 F (36.8 C) (Oral)   Resp 14  Ht 5\' 5"  (1.651 m)  Wt 186 lb (84.369 kg)  BMI 30.95 kg/m2 GEN- NAD, alert and oriented x3 HEENT- PERRL, EOMI, non injected sclera, pink conjunctiva, MMM, oropharynx clear CVS- RRR, no murmur RESP-CTAB ABD-NABS,soft,NT,ND Psych- normal affect and mood, well groomed, no SI EXT- No edema Pulses- Radial, DP- 2+        Assessment & Plan:      Problem List Items Addressed This Visit    None      Note: This dictation was prepared with Dragon dictation along with smaller phrase technology. Any transcriptional errors that result from this process are unintentional.

## 2014-11-27 NOTE — Patient Instructions (Addendum)
Stop the pravastatin and see how your legs do  Thyroid is normal  F/U 4 months

## 2014-11-27 NOTE — Assessment & Plan Note (Signed)
Picture does not quite fit statin side effect but worth a trial off, could possibly be just muscle cramps in general especially with walking, not enough water, potassium

## 2014-11-27 NOTE — Assessment & Plan Note (Signed)
Well controlled 

## 2015-01-02 ENCOUNTER — Ambulatory Visit: Payer: BC Managed Care – PPO | Admitting: Nutrition

## 2015-01-23 ENCOUNTER — Telehealth: Payer: Self-pay | Admitting: *Deleted

## 2015-01-23 MED ORDER — BUPROPION HCL ER (XL) 300 MG PO TB24: 300.0000 mg | ORAL_TABLET | Freq: Every day | ORAL | Status: DC

## 2015-01-23 NOTE — Telephone Encounter (Signed)
Pt came into office stating she has been trying to call to get a refill on Wellbutrin and was not able to get through, Wellbutrin has been sent to pt's pharmacy. Pt aware script has been called in.

## 2015-03-13 ENCOUNTER — Other Ambulatory Visit: Payer: Self-pay | Admitting: Family Medicine

## 2015-03-13 MED ORDER — DICLOFENAC SODIUM 1 % TD GEL: 2.0000 g | Freq: Four times a day (QID) | TRANSDERMAL | Status: DC

## 2015-03-13 MED ORDER — BUPROPION HCL ER (XL) 300 MG PO TB24: 300.0000 mg | ORAL_TABLET | Freq: Every day | ORAL | Status: DC

## 2015-03-13 NOTE — Telephone Encounter (Signed)
Med sent to pharm and must keep scheduled appt

## 2015-03-18 ENCOUNTER — Other Ambulatory Visit: Payer: Self-pay | Admitting: *Deleted

## 2015-03-18 MED ORDER — PRAVASTATIN SODIUM 40 MG PO TABS: 40.0000 mg | ORAL_TABLET | Freq: Every day | ORAL | Status: DC

## 2015-03-18 NOTE — Telephone Encounter (Signed)
Received fax requesting refill on Pravastatin.   Refill appropriate and filled per protocol. 

## 2015-03-25 ENCOUNTER — Other Ambulatory Visit: Payer: Self-pay | Admitting: *Deleted

## 2015-03-25 MED ORDER — PROPRANOLOL HCL ER BEADS 80 MG PO CP24: 80.0000 mg | ORAL_CAPSULE | Freq: Every day | ORAL | Status: DC

## 2015-03-25 NOTE — Telephone Encounter (Signed)
Received fax requesting refill on Inderal.   Refill appropriate and filled per protocol.

## 2015-03-29 ENCOUNTER — Ambulatory Visit: Payer: BC Managed Care – PPO | Admitting: Family Medicine

## 2015-04-12 ENCOUNTER — Other Ambulatory Visit: Payer: Self-pay | Admitting: *Deleted

## 2015-04-12 MED ORDER — PROPRANOLOL HCL ER BEADS 80 MG PO CP24: 80.0000 mg | ORAL_CAPSULE | Freq: Every day | ORAL | Status: DC

## 2015-04-12 NOTE — Telephone Encounter (Signed)
Received fax requesting refill on Inderal.   Medication filled x1 with no refills.   Requires office visit before any further refills can be given.   Letter sent.

## 2015-05-08 ENCOUNTER — Other Ambulatory Visit: Payer: Self-pay | Admitting: Family Medicine

## 2015-06-11 ENCOUNTER — Ambulatory Visit (INDEPENDENT_AMBULATORY_CARE_PROVIDER_SITE_OTHER): Payer: BC Managed Care – PPO | Admitting: Family Medicine

## 2015-06-11 ENCOUNTER — Encounter: Payer: Self-pay | Admitting: Family Medicine

## 2015-06-11 VITALS — BP 128/68 | HR 68 | Temp 98.1°F | Resp 14 | Ht 65.0 in | Wt 184.0 lb

## 2015-06-11 DIAGNOSIS — F329 Major depressive disorder, single episode, unspecified: Secondary | ICD-10-CM | POA: Diagnosis not present

## 2015-06-11 DIAGNOSIS — E7800 Pure hypercholesterolemia, unspecified: Secondary | ICD-10-CM

## 2015-06-11 DIAGNOSIS — E669 Obesity, unspecified: Secondary | ICD-10-CM | POA: Diagnosis not present

## 2015-06-11 DIAGNOSIS — E039 Hypothyroidism, unspecified: Secondary | ICD-10-CM | POA: Insufficient documentation

## 2015-06-11 DIAGNOSIS — F411 Generalized anxiety disorder: Secondary | ICD-10-CM

## 2015-06-11 DIAGNOSIS — I10 Essential (primary) hypertension: Secondary | ICD-10-CM | POA: Diagnosis not present

## 2015-06-11 DIAGNOSIS — E079 Disorder of thyroid, unspecified: Secondary | ICD-10-CM | POA: Diagnosis not present

## 2015-06-11 DIAGNOSIS — F32A Depression, unspecified: Secondary | ICD-10-CM

## 2015-06-11 MED ORDER — MOMETASONE FUROATE 0.1 % EX CREA: 1.0000 "application " | TOPICAL_CREAM | Freq: Every day | CUTANEOUS | Status: DC

## 2015-06-11 MED ORDER — LORAZEPAM 0.5 MG PO TABS: 0.5000 mg | ORAL_TABLET | Freq: Two times a day (BID) | ORAL | Status: DC

## 2015-06-11 MED ORDER — DICLOFENAC SODIUM 75 MG PO TBEC: 75.0000 mg | DELAYED_RELEASE_TABLET | Freq: Every day | ORAL | Status: DC

## 2015-06-11 MED ORDER — PROPRANOLOL HCL ER BEADS 80 MG PO CP24: 80.0000 mg | ORAL_CAPSULE | Freq: Every day | ORAL | Status: DC

## 2015-06-11 MED ORDER — DICLOFENAC SODIUM 1 % TD GEL: 2.0000 g | Freq: Four times a day (QID) | TRANSDERMAL | Status: DC

## 2015-06-11 NOTE — Progress Notes (Signed)
Patient ID: Debra [name], female   DOB: [birth year], 64 y.o.   MRN: [id number]   Subjective:    Patient ID: Debra [name], female    DOB: 01/30/1951, 64 y.o.   MRN: [id number]  Patient presents for 4 month F/U and Medication Refill  agent here for medication refills. She is no particular concerns. She is leaving the country to going excursion with her husband to [place]. She is doing well with her medications. Her stress levels are significantly lower now that she is graduated her last class. Her medicines were reviewed.    Review Of Systems:  GEN- denies fatigue, fever, weight loss,weakness, recent illness HEENT- denies eye drainage, change in vision, nasal discharge, CVS- denies chest pain, palpitations RESP- denies SOB, cough, wheeze ABD- denies N/V, change in stools, abd pain GU- denies dysuria, hematuria, dribbling, incontinence MSK- denies joint pain, muscle aches, injury Neuro- denies headache, dizziness, syncope, seizure activity       Objective:    BP 128/68 mmHg  Pulse 68  Temp(Src) 98.1 F (36.7 C) (Oral)  Resp 14  Ht 5\' 5"  (1.651 m)  Wt 184 lb (83.462 kg)  BMI 30.62 kg/m2 GEN- NAD, alert and oriented x3 HEENT- PERRL, EOMI, non injected sclera, pink conjunctiva, MMM, oropharynx clear Neck- Supple, no thyromegaly CVS- RRR, no murmur RESP-CTAB Psych- normal affect and mood EXT- No edema Pulses- Radial, DP- 2+        Assessment & Plan:      Problem List Items Addressed This Visit    None      Note: This dictation was prepared with Dragon dictation along with smaller phrase technology. Any transcriptional errors that result from this process are unintentional.

## 2015-06-11 NOTE — Assessment & Plan Note (Signed)
She has discontinued synthroid

## 2015-06-11 NOTE — Patient Instructions (Addendum)
Continue current medications Return for fasting labs in August  TDAP as prescribed  F/U as needed  6 months

## 2015-06-11 NOTE — Assessment & Plan Note (Signed)
Continue to work on diet and weight loss.

## 2015-06-11 NOTE — Assessment & Plan Note (Signed)
Blood pressure well controlled no change in medication and be

## 2015-06-11 NOTE — Assessment & Plan Note (Signed)
Continue  pravastatin she is taking 20 mg once a day

## 2015-06-11 NOTE — Assessment & Plan Note (Addendum)
We will control with Wellbutrin and lorazepam as needed  She will return for fasting labs

## 2015-06-12 ENCOUNTER — Telehealth: Payer: Self-pay | Admitting: *Deleted

## 2015-06-12 NOTE — Telephone Encounter (Signed)
Received PA determination.   PA approved 05/13/2015- 06/11/2016.  Pharmacy made aware.

## 2015-06-12 NOTE — Telephone Encounter (Signed)
Received request from pharmacy for PA on Voltaren Gel.   PA submitted.   Dx: Joint Pain (M25.50)

## 2015-07-08 ENCOUNTER — Other Ambulatory Visit: Payer: Self-pay | Admitting: Family Medicine

## 2015-07-22 ENCOUNTER — Telehealth: Payer: Self-pay | Admitting: Family Medicine

## 2015-07-22 MED ORDER — MOMETASONE FUROATE 0.1 % EX CREA
1.0000 | TOPICAL_CREAM | Freq: Every day | CUTANEOUS | Status: DC
Start: 2015-07-22 — End: 2019-08-18

## 2015-07-22 NOTE — Telephone Encounter (Signed)
Patient calling to say that when she went out of the country they took her mometasone because it was not in the proper container, would like to know if we could call in more for her to [facility]   [phone]

## 2015-07-22 NOTE — Telephone Encounter (Signed)
Patient returned call and made aware.

## 2015-07-22 NOTE — Telephone Encounter (Signed)
Refill sent to pharmacy.   Call placed to patient to advised that insurance may not cover if prescription is being filled too soon.   [place].

## 2015-08-19 ENCOUNTER — Encounter: Payer: Self-pay | Admitting: Family Medicine

## 2015-08-19 ENCOUNTER — Ambulatory Visit (INDEPENDENT_AMBULATORY_CARE_PROVIDER_SITE_OTHER): Payer: BC Managed Care – PPO | Admitting: Family Medicine

## 2015-08-19 VITALS — BP 126/74 | HR 80 | Temp 98.3°F | Resp 14 | Ht 65.0 in | Wt 183.0 lb

## 2015-08-19 DIAGNOSIS — M19071 Primary osteoarthritis, right ankle and foot: Secondary | ICD-10-CM | POA: Diagnosis not present

## 2015-08-19 DIAGNOSIS — Z23 Encounter for immunization: Secondary | ICD-10-CM

## 2015-08-19 DIAGNOSIS — M67471 Ganglion, right ankle and foot: Secondary | ICD-10-CM | POA: Diagnosis not present

## 2015-08-19 DIAGNOSIS — B880 Other acariasis: Secondary | ICD-10-CM

## 2015-08-19 NOTE — Progress Notes (Signed)
Patient ID: Debra [name], female   DOB: February 12, 1951, 64 y.o.   MRN: [id number]   Subjective:    Patient ID: Debra [name], female    DOB: [birth year], 64 y.o.   MRN: [id number]  Patient presents for Numbness in Toes patient here with bilateral foot pain. She has a knot that has been on the top of her right foot for quite some time it appears to be enlarging and actually causes pain towards her toes especially when she is standing on her feet long. The time. If she is active she does not feel it is much it is not tender to touch. She's also noticed some numbness in her toes on the right foot. She has known generalized osteoarthritis but unable to tolerate the sulfa-like medications.  Note she was also bitten by multiple chiggers and has been using topical steroidal cream  She had an x-ray of her foot in the past which showed degenerative diffuse changes of the foot she also had a small fracture at that time. An heel spur  Review Of Systems:  GEN- denies fatigue, fever, weight loss,weakness, recent illness HEENT- denies eye drainage, change in vision, nasal discharge, CVS- denies chest pain, palpitations RESP- denies SOB, cough, wheeze ABD- denies N/V, change in stools, abd pain GU- denies dysuria, hematuria, dribbling, incontinence MSK- +joint pain, muscle aches, injury Neuro- denies headache, dizziness, syncope, seizure activity       Objective:    BP 126/74 mmHg  Pulse 80  Temp(Src) 98.3 F (36.8 C) (Oral)  Resp 14  Ht 5\' 5"  (1.651 m)  Wt 183 lb (83.008 kg)  BMI 30.45 kg/m2 GEN- NAD, alert and oriented x3 Skin- multiple bug bites with scabs at center, no active blisters, +excoriations  EXT- Right foot- nickle size swelling at midfoot NT, moves with tendons, good ROM Ankle and toes, bilat bunions, left foot, small dime size area of swelling at midfoot  Neuro- sensation intact, monofilament in tact  Pulses- Radial, DP- 2+        Assessment & Plan:      Problem List  Items Addressed This Visit    None    Visit Diagnoses    Need for prophylactic vaccination and inoculation against influenza    -  Primary    Relevant Orders    Flu Vaccine QUAD 36+ mos PF IM (Fluarix & Fluzone Quad PF) (Completed)    Need for prophylactic vaccination against Streptococcus pneumoniae (pneumococcus)        Relevant Orders    Pneumococcal conjugate vaccine 13-valent (Completed)    Chiggers        improved with topical steroid,OTC benadryl    Ganglion cyst of right foot        based on apperance concern for ganglion cyst vs bone spur, will refer to podiatry , she has topical and oral NSAIDS as needed. Discussed neuropathic meds will hold for now    Primary osteoarthritis of right foot           Note: This dictation was prepared with Dragon dictation along with smaller phrase technology. Any transcriptional errors that result from this process are unintentional.

## 2015-08-19 NOTE — Patient Instructions (Signed)
Podiatry Referral  Flu shot given  Prevnar 13 shot given  F/U as previous

## 2015-10-04 ENCOUNTER — Ambulatory Visit (INDEPENDENT_AMBULATORY_CARE_PROVIDER_SITE_OTHER): Payer: BC Managed Care – PPO | Admitting: Podiatry

## 2015-10-04 ENCOUNTER — Encounter: Payer: Self-pay | Admitting: Podiatry

## 2015-10-04 ENCOUNTER — Ambulatory Visit (INDEPENDENT_AMBULATORY_CARE_PROVIDER_SITE_OTHER): Payer: BC Managed Care – PPO

## 2015-10-04 DIAGNOSIS — R52 Pain, unspecified: Secondary | ICD-10-CM | POA: Diagnosis not present

## 2015-10-04 DIAGNOSIS — M898X9 Other specified disorders of bone, unspecified site: Secondary | ICD-10-CM

## 2015-10-04 NOTE — Progress Notes (Signed)
   Subjective:    Patient ID: Debra [name], female    DOB: [birth year], 64 y.o.   MRN: [id number]  HPI 64 year old female presents the office with concerns of a knot on the top of her right foot which been ongoing for greater than 2 years. She's the area sometimes will throb however does not hurt at this time. She said no previous treatment. She denies any recent injury or trauma to the area. She does have a strong family history of rheumatoid arthritis and she was proceeded given treatment however she did not take the medicine due to potential allergy. No other complaints at this time.  Review of Systems  All other systems reviewed and are negative.      Objective:   Physical Exam AAO 3, NAD  DP/PT pulses palpable 2/4, CRT less than 3 seconds Protective sensation intact with Simms Weinstein monofilament, vibratory sensation intact, Achilles tendon reflex intact. The dorsal aspect of the right midfoot is a large palpable exostosis at approximately Lisfranc joint. There is no tenderness to palpation around this area. There is no edema, erythema, increase in warmth. There is no open lesions or pre-ulcerative lesions. There is also decreasing first MTPJ range of motion bilaterally and dorsiflexion. There is a couple exostosis off the dorsal first MTPJ. No areas of tenderness to bilateral lower extremity's. MMT 5/5, ROM WNL except for otherwise noted.  No open lesions or pre-ulcerative lesions No pain with calf compression, swelling, warmth, erythema.      Assessment & Plan:  64 year old female right dorsal exostosis, asymptomatic at this time. -X-rays were obtained and reviewed with the patient.  -Treatment options discussed including all alternatives, risks, and complications -Etiology of symptoms were discussed -Discussed steroid injection however is not painful this time she wishes to hold off. -I discussed shoe gear modifications. -Orthotics to help control foot type help take pressure  off the midfoot. -Follow-up as needed. Call any questions or concerns in the meantime.  Celesta Gentile, DPM

## 2015-11-11 ENCOUNTER — Other Ambulatory Visit: Payer: Self-pay | Admitting: Family Medicine

## 2015-11-11 DIAGNOSIS — Z1231 Encounter for screening mammogram for malignant neoplasm of breast: Secondary | ICD-10-CM

## 2015-11-27 ENCOUNTER — Ambulatory Visit (HOSPITAL_COMMUNITY)
Admission: RE | Admit: 2015-11-27 | Discharge: 2015-11-27 | Disposition: A | Discharge status: Home / Self Care | Payer: BC Managed Care – PPO | Source: Ambulatory Visit | Attending: Family Medicine | Admitting: Family Medicine

## 2015-11-27 DIAGNOSIS — Z1231 Encounter for screening mammogram for malignant neoplasm of breast: Secondary | ICD-10-CM

## 2015-12-13 ENCOUNTER — Ambulatory Visit: Payer: BC Managed Care – PPO | Admitting: Family Medicine

## 2015-12-18 ENCOUNTER — Other Ambulatory Visit: Payer: Self-pay | Admitting: Family Medicine

## 2015-12-18 NOTE — Telephone Encounter (Signed)
Refill appropriate and filled per protocol. 

## 2016-01-13 ENCOUNTER — Other Ambulatory Visit: Payer: Self-pay | Admitting: Family Medicine

## 2016-01-13 NOTE — Telephone Encounter (Signed)
Okay to refill? 

## 2016-01-13 NOTE — Telephone Encounter (Signed)
Ok to refill??  Last office visit 10/04/2015.  Last refill 06/11/2015, #3 refills.

## 2016-01-13 NOTE — Telephone Encounter (Signed)
Medication called to pharmacy. 

## 2016-01-20 ENCOUNTER — Ambulatory Visit (INDEPENDENT_AMBULATORY_CARE_PROVIDER_SITE_OTHER): Payer: BC Managed Care – PPO | Admitting: Family Medicine

## 2016-01-20 ENCOUNTER — Encounter: Payer: Self-pay | Admitting: Family Medicine

## 2016-01-20 VITALS — BP 128/78 | HR 60 | Temp 98.2°F | Resp 18 | Wt 178.0 lb

## 2016-01-20 DIAGNOSIS — F329 Major depressive disorder, single episode, unspecified: Secondary | ICD-10-CM | POA: Diagnosis not present

## 2016-01-20 DIAGNOSIS — E079 Disorder of thyroid, unspecified: Secondary | ICD-10-CM

## 2016-01-20 DIAGNOSIS — E7800 Pure hypercholesterolemia, unspecified: Secondary | ICD-10-CM

## 2016-01-20 DIAGNOSIS — F32A Depression, unspecified: Secondary | ICD-10-CM

## 2016-01-20 DIAGNOSIS — I10 Essential (primary) hypertension: Secondary | ICD-10-CM

## 2016-01-20 LAB — T4, FREE: FREE T4: 1.3 ng/dL (ref 0.8–1.8)

## 2016-01-20 LAB — CBC WITH DIFFERENTIAL/PLATELET
BASOS ABS: 0 10*3/uL (ref 0.0–0.1)
Basophils Relative: 1 % (ref 0–1)
Eosinophils Absolute: 0.2 10*3/uL (ref 0.0–0.7)
Eosinophils Relative: 4 % (ref 0–5)
HEMATOCRIT: 44.3 % (ref 36.0–46.0)
HEMOGLOBIN: 14.3 g/dL (ref 12.0–15.0)
Lymphocytes Relative: 38 % (ref 12–46)
Lymphs Abs: 1.7 10*3/uL (ref 0.7–4.0)
MCH: 28.7 pg (ref 26.0–34.0)
MCHC: 32.3 g/dL (ref 30.0–36.0)
MCV: 88.8 fL (ref 78.0–100.0)
MPV: 10.3 fL (ref 8.6–12.4)
Monocytes Absolute: 0.3 10*3/uL (ref 0.1–1.0)
Monocytes Relative: 7 % (ref 3–12)
NEUTROS ABS: 2.3 10*3/uL (ref 1.7–7.7)
Neutrophils Relative %: 50 % (ref 43–77)
Platelets: 219 10*3/uL (ref 150–400)
RBC: 4.99 MIL/uL (ref 3.87–5.11)
RDW: 16 % — ABNORMAL HIGH (ref 11.5–15.5)
WBC: 4.6 10*3/uL (ref 4.0–10.5)

## 2016-01-20 LAB — LIPID PANEL
Cholesterol: 222 mg/dL — ABNORMAL HIGH (ref 125–200)
HDL: 55 mg/dL (ref >=46)
LDL Cholesterol: 141 mg/dL — ABNORMAL HIGH (ref <130)
TRIGLYCERIDES: 131 mg/dL (ref <150)
Total CHOL/HDL Ratio: 4 Ratio (ref <=5.0)
VLDL: 26 mg/dL (ref <30)

## 2016-01-20 LAB — COMPREHENSIVE METABOLIC PANEL
ALBUMIN: 4.2 g/dL (ref 3.6–5.1)
ALK PHOS: 50 U/L (ref 33–130)
ALT: 24 U/L (ref 6–29)
AST: 16 U/L (ref 10–35)
BUN: 20 mg/dL (ref 7–25)
CALCIUM: 9.4 mg/dL (ref 8.6–10.4)
CO2: 23 mmol/L (ref 20–31)
CREATININE: 0.91 mg/dL (ref 0.50–0.99)
Chloride: 106 mmol/L (ref 98–110)
Glucose, Bld: 94 mg/dL (ref 70–99)
Potassium: 4.4 mmol/L (ref 3.5–5.3)
SODIUM: 140 mmol/L (ref 135–146)
Total Bilirubin: 0.5 mg/dL (ref 0.2–1.2)
Total Protein: 6.8 g/dL (ref 6.1–8.1)

## 2016-01-20 LAB — TSH: TSH: 4.06 mIU/L

## 2016-01-20 LAB — T3, FREE: T3, Free: 2.5 pg/mL (ref 2.3–4.2)

## 2016-01-20 NOTE — Assessment & Plan Note (Signed)
Recheck thyroid function test Is done significantly well with her weight and her activity she is no longer in the obese category

## 2016-01-20 NOTE — Progress Notes (Signed)
Patient ID: Debra [name], female   DOB: 11-Sep-1951, 65 y.o.   MRN: [id number]    Subjective:    Patient ID: Debra [name], female    DOB: January 03, 1951, 65 y.o.   MRN: [id number]  Patient presents for Follow-up  issue here for follow-up on chronic medical problems. She is also due for fasting blood work. She has been following the nutritionist guidelines and she has been more active her weight is down 5 pounds since her last visit 6 months ago She did sprain her ankle back in December but has been using massage heat ice and her pain is much improved. She is able to walk without any difficulties this weekend she walked 2-1/2 miles.  She does have a mole on the right nares she was seen by her dermatologist they did not recommend removing this however she does pick at it and it bleeds. Initially in the visit she wants have this removed at the end she decided that she wanted to wait   Review Of Systems:  GEN- denies fatigue, fever, weight loss,weakness, recent illness HEENT- denies eye drainage, change in vision, nasal discharge, CVS- denies chest pain, palpitations RESP- denies SOB, cough, wheeze ABD- denies N/V, change in stools, abd pain GU- denies dysuria, hematuria, dribbling, incontinence MSK- +joint pain, muscle aches, injury Neuro- denies headache, dizziness, syncope, seizure activity       Objective:    BP 128/78 mmHg  Pulse 60  Temp(Src) 98.2 F (36.8 C) (Oral)  Resp 18  Wt 178 lb (80.74 kg) GEN- NAD, alert and oriented x3, weight down 5lbs HEENT- PERRL, EOMI, non injected sclera, pink conjunctiva, MMM, oropharynx clear Neck- Supple, no thyromegaly CVS- RRR, no murmur RESP-CTAB Psych- Normal affect and mood EXT- No edema MSK- FROM left ankle, minimal swelling below malleous, no deformity, normal weight bearing, ligaments in tact  Pulses- Radial, DP- 2+        Assessment & Plan:   Recent ankle sprain this is much improved she's backed her regular activities. No  further intervention needed   Problem List Items Addressed This Visit    Thyroid disorder    Recheck thyroid function test Is done significantly well with her weight and her activity she is no longer in the obese category      Hypertension    Blood pressure is well controlled or change in medication      High cholesterol - Primary    Check fasting lipids she is currently on pravastatin 20 mg      Depression    Continue current dose of Wellbutrin and lorazepam as needed         Note: This dictation was prepared with Dragon dictation along with smaller phrase technology. Any transcriptional errors that result from this process are unintentional.

## 2016-01-20 NOTE — Assessment & Plan Note (Signed)
Continue current dose of Wellbutrin and lorazepam as needed

## 2016-01-20 NOTE — Assessment & Plan Note (Signed)
Check fasting lipids she is currently on pravastatin 20 mg

## 2016-01-20 NOTE — Patient Instructions (Addendum)
F/U 6 months for Physical Referral [facility]

## 2016-01-20 NOTE — Assessment & Plan Note (Signed)
Blood pressure is well controlled or change in medication

## 2016-01-23 ENCOUNTER — Encounter: Payer: Self-pay | Admitting: *Deleted

## 2016-01-28 ENCOUNTER — Other Ambulatory Visit: Payer: Self-pay | Admitting: Family Medicine

## 2016-01-28 NOTE — Telephone Encounter (Signed)
Refill appropriate and filled per protocol. 

## 2016-03-10 ENCOUNTER — Other Ambulatory Visit: Payer: Self-pay | Admitting: Family Medicine

## 2016-05-19 ENCOUNTER — Other Ambulatory Visit: Payer: Self-pay | Admitting: Family Medicine

## 2016-06-15 ENCOUNTER — Encounter: Payer: Self-pay | Admitting: Family Medicine

## 2016-06-15 NOTE — Telephone Encounter (Signed)
This encounter was created in error - please disregard.

## 2016-06-15 NOTE — Telephone Encounter (Signed)
error 

## 2016-07-06 ENCOUNTER — Other Ambulatory Visit: Payer: Self-pay | Admitting: Family Medicine

## 2016-07-06 NOTE — Telephone Encounter (Signed)
Refill appropriate and filled per protocol. 

## 2016-07-07 ENCOUNTER — Encounter: Payer: BC Managed Care – PPO | Admitting: Family Medicine

## 2016-07-23 ENCOUNTER — Other Ambulatory Visit: Payer: Self-pay | Admitting: Family Medicine

## 2016-07-23 NOTE — Telephone Encounter (Signed)
Refill appropriate and filled per protocol. 

## 2016-08-17 ENCOUNTER — Other Ambulatory Visit: Payer: BC Managed Care – PPO

## 2016-08-17 ENCOUNTER — Encounter: Payer: BC Managed Care – PPO | Admitting: Family Medicine

## 2016-08-25 ENCOUNTER — Ambulatory Visit (INDEPENDENT_AMBULATORY_CARE_PROVIDER_SITE_OTHER): Payer: BC Managed Care – PPO | Admitting: Family Medicine

## 2016-08-25 VITALS — BP 156/80 | HR 80 | Temp 98.1°F | Wt 181.0 lb

## 2016-08-25 DIAGNOSIS — N3000 Acute cystitis without hematuria: Secondary | ICD-10-CM | POA: Diagnosis not present

## 2016-08-25 MED ORDER — CEPHALEXIN 500 MG PO CAPS: 500.0000 mg | ORAL_CAPSULE | Freq: Three times a day (TID) | ORAL | 0 refills | Status: DC

## 2016-08-25 NOTE — Progress Notes (Signed)
Subjective:    Patient ID: Debra [name], female    DOB: [birth year], 65 y.o.   MRN: [id number]  HPI She is here today reporting 2 days of dysuria. She also reports increased frequency urgency and hesitancy. She is also noticed trace hematuria. She is currently on Azo which she took this morning. This is helped with the symptoms some. She denies any nausea vomiting diarrhea or fever Past Medical History:  Diagnosis Date  . Diverticulitis   . Diverticulosis of colon (without mention of hemorrhage)   . Esophageal reflux   . Generalized anxiety disorder   . Hiatal hernia   . Iron deficiency anemia 01/19/2012  . Mixed hyperlipidemia   . OA (osteoarthritis)   . PONV (postoperative nausea and vomiting)   . Unspecified hypothyroidism    Past Surgical History:  Procedure Laterality Date  . ABDOMINAL HYSTERECTOMY    . APPENDECTOMY    . BOWEL RESECTION  05/11/2012   Procedure: LOW ANTERIOR BOWEL RESECTION;  Surgeon: Jamesetta So, MD;  Location: [facility];  Service: General;;  . CESAREAN SECTION     First one 28 , second on 1984  . COLON RESECTION  05/11/2012   Procedure: HAND ASSISTED LAPAROSCOPIC COLON RESECTION;  Surgeon: Jamesetta So, MD;  Location: [facility];  Service: General;  Laterality: N/A;  Laparoscopic Hand Assisted Partial Colectomy;  . COLONOSCOPY  2011-2008   Dr. Arnoldo Morale  . COLONOSCOPY  03/10/2012   Procedure: COLONOSCOPY;  Surgeon: Rogene Houston, MD;  Location: [facility];  Service: Endoscopy;  Laterality: N/A;  200  . Hysterctomy  1988  . TONSILLECTOMY     Current Outpatient Prescriptions on File Prior to Visit  Medication Sig Dispense Refill  . buPROPion (WELLBUTRIN XL) 300 MG 24 hr tablet TAKE ONE TABLET BY MOUTH ONCE DAILY. 90 tablet 0  . diclofenac (VOLTAREN) 75 MG EC tablet TAKE ONE TABLET BY MOUTH ONCE DAILY. 30 tablet 6  . diclofenac sodium (VOLTAREN) 1 % GEL Apply 2 g topically 4 (four) times daily. 1 Tube 5  . LORazepam (ATIVAN) 0.5 MG tablet TAKE (1) TABLET  BY MOUTH TWICE DAILY. 60 tablet 3  . mometasone (ELOCON) 0.1 % cream Apply 1 application topically daily. 45 g 0  . Multiple Vitamin (MULITIVITAMIN WITH MINERALS) TABS Take 1 tablet by mouth daily.    Marland Kitchen omeprazole (PRILOSEC) 20 MG capsule TAKE 2 CAPSULES BY MOUTH ONCE DAILY 30 capsule 3  . pravastatin (PRAVACHOL) 40 MG tablet TAKE ONE TABLET BY MOUTH ONCE DAILY. 90 tablet 1  . propranolol (INNOPRAN XL) 80 MG 24 hr capsule Take 1 capsule (80 mg total) by mouth at bedtime. 30 capsule 3  . propranolol ER (INDERAL LA) 80 MG 24 hr capsule TAKE (1) CAPSULE BY MOUTH AT BEDTIME. 90 capsule 0  . [DISCONTINUED] Fluticasone-Salmeterol (ADVAIR) 100-50 MCG/DOSE AEPB Inhale 1 puff into the lungs every 12 (twelve) hours.     No current facility-administered medications on file prior to visit.    Allergies  Allergen Reactions  . Codeine Nausea And Vomiting  . Morphine And Related Nausea And Vomiting  . Sulfa Antibiotics Hives and Itching   Social History   Social History  . Marital status: Married    Spouse name: N/A  . Number of children: N/A  . Years of education: N/A   Occupational History  . Not on file.   Social History Main Topics  . Smoking status: Never Smoker  . Smokeless tobacco: Never Used  . Alcohol  use No  . Drug use: No  . Sexual activity: Yes    Birth control/ protection: Surgical   Other Topics Concern  . Not on file   Social History Narrative  . No narrative on file      Review of Systems  All other systems reviewed and are negative.      Objective:   Physical Exam  Constitutional: She appears well-developed and well-nourished.  Cardiovascular: Normal rate, regular rhythm and normal heart sounds.   Pulmonary/Chest: Effort normal and breath sounds normal. No respiratory distress. She has no wheezes.  Abdominal: Soft. Bowel sounds are normal.  Vitals reviewed.         Assessment & Plan:  Acute cystitis without hematuria - Plan: cephALEXin (KEFLEX) 500 MG  capsule, CULTURE, URINE COMPREHENSIVE  Begin Keflex 500 mg by mouth 3 times a day. Send urine culture given the fact urinalysis is skewed based on color change. Blood pressures elevated today. Recommended the patient monitor her blood pressure at home and follow-up with her PCP

## 2016-08-27 LAB — CULTURE, URINE COMPREHENSIVE: Colony Count: >=100000

## 2016-08-28 ENCOUNTER — Telehealth: Payer: Self-pay | Admitting: *Deleted

## 2016-08-28 NOTE — Telephone Encounter (Signed)
Received fax from pharmacy requesting prescription for Estradiol.   MD reviewed and recommends that Estradiol can be used if UTI are recurrent. At this time, treat UTI as prescribed.   Pharmacy made aware.   Call placed to patient. [place].

## 2016-09-01 ENCOUNTER — Encounter: Payer: BC Managed Care – PPO | Admitting: Family Medicine

## 2016-09-02 NOTE — Telephone Encounter (Signed)
Call placed to patient. LMTRC.  

## 2016-09-03 NOTE — Telephone Encounter (Signed)
Multiple calls placed to patient with no answer and no return call.   Message to be closed.  

## 2016-10-02 ENCOUNTER — Encounter: Payer: Self-pay | Admitting: Family Medicine

## 2016-10-02 ENCOUNTER — Ambulatory Visit (INDEPENDENT_AMBULATORY_CARE_PROVIDER_SITE_OTHER): Payer: BC Managed Care – PPO | Admitting: Family Medicine

## 2016-10-02 VITALS — BP 138/88 | HR 54 | Temp 98.1°F | Resp 14 | Ht 65.0 in | Wt 176.0 lb

## 2016-10-02 DIAGNOSIS — M8589 Other specified disorders of bone density and structure, multiple sites: Secondary | ICD-10-CM

## 2016-10-02 DIAGNOSIS — Z0000 Encounter for general adult medical examination without abnormal findings: Secondary | ICD-10-CM | POA: Diagnosis not present

## 2016-10-02 DIAGNOSIS — I10 Essential (primary) hypertension: Secondary | ICD-10-CM | POA: Diagnosis not present

## 2016-10-02 DIAGNOSIS — F411 Generalized anxiety disorder: Secondary | ICD-10-CM | POA: Diagnosis not present

## 2016-10-02 DIAGNOSIS — E7800 Pure hypercholesterolemia, unspecified: Secondary | ICD-10-CM | POA: Diagnosis not present

## 2016-10-02 DIAGNOSIS — E079 Disorder of thyroid, unspecified: Secondary | ICD-10-CM | POA: Diagnosis not present

## 2016-10-02 DIAGNOSIS — N3000 Acute cystitis without hematuria: Secondary | ICD-10-CM

## 2016-10-02 DIAGNOSIS — S7002XA Contusion of left hip, initial encounter: Secondary | ICD-10-CM

## 2016-10-02 LAB — CBC WITH DIFFERENTIAL/PLATELET
BASOS PCT: 1 %
Basophils Absolute: 47 cells/uL (ref 0–200)
EOS ABS: 188 {cells}/uL (ref 15–500)
Eosinophils Relative: 4 %
HEMATOCRIT: 46.3 % — AB (ref 35.0–45.0)
Hemoglobin: 14.8 g/dL (ref 12.0–15.0)
LYMPHS PCT: 36 %
Lymphs Abs: 1692 cells/uL (ref 850–3900)
MCH: 29.7 pg (ref 27.0–33.0)
MCHC: 32 g/dL (ref 32.0–36.0)
MCV: 93 fL (ref 80.0–100.0)
MONO ABS: 376 {cells}/uL (ref 200–950)
MPV: 11 fL (ref 7.5–12.5)
Monocytes Relative: 8 %
Neutro Abs: 2397 cells/uL (ref 1500–7800)
Neutrophils Relative %: 51 %
Platelets: 253 10*3/uL (ref 140–400)
RBC: 4.98 MIL/uL (ref 3.80–5.10)
RDW: 14 % (ref 11.0–15.0)
WBC: 4.7 10*3/uL (ref 3.8–10.8)

## 2016-10-02 LAB — LIPID PANEL
CHOL/HDL RATIO: 3.7 ratio (ref <=5.0)
CHOLESTEROL: 211 mg/dL — AB (ref 125–200)
HDL: 57 mg/dL (ref >=46)
LDL Cholesterol: 124 mg/dL (ref <130)
TRIGLYCERIDES: 148 mg/dL (ref <150)
VLDL: 30 mg/dL (ref <30)

## 2016-10-02 LAB — T4, FREE: Free T4: 1.1 ng/dL (ref 0.8–1.8)

## 2016-10-02 LAB — COMPREHENSIVE METABOLIC PANEL
ALBUMIN: 4.3 g/dL (ref 3.6–5.1)
ALK PHOS: 95 U/L (ref 33–130)
ALT: 129 U/L — AB (ref 6–29)
AST: 27 U/L (ref 10–35)
BUN: 15 mg/dL (ref 7–25)
CALCIUM: 9.5 mg/dL (ref 8.6–10.4)
CO2: 27 mmol/L (ref 20–31)
Chloride: 105 mmol/L (ref 98–110)
Creat: 0.88 mg/dL (ref 0.50–0.99)
Glucose, Bld: 92 mg/dL (ref 70–99)
POTASSIUM: 4.6 mmol/L (ref 3.5–5.3)
Sodium: 140 mmol/L (ref 135–146)
TOTAL PROTEIN: 7 g/dL (ref 6.1–8.1)
Total Bilirubin: 0.6 mg/dL (ref 0.2–1.2)

## 2016-10-02 LAB — T3, FREE: T3, Free: 2.8 pg/mL (ref 2.3–4.2)

## 2016-10-02 LAB — TSH: TSH: 3.97 mIU/L

## 2016-10-02 MED ORDER — DICYCLOMINE HCL 20 MG PO TABS: 20.0000 mg | ORAL_TABLET | Freq: Three times a day (TID) | ORAL | 3 refills | Status: DC

## 2016-10-02 MED ORDER — CEPHALEXIN 500 MG PO CAPS: 500.0000 mg | ORAL_CAPSULE | Freq: Four times a day (QID) | ORAL | 0 refills | Status: DC

## 2016-10-02 NOTE — Assessment & Plan Note (Signed)
Off synthroid now greater than 1 year

## 2016-10-02 NOTE — Assessment & Plan Note (Signed)
Recheck lipid panel. We may need to alternate her low on high-dose breathing used a high dosed 3-4 times a week will see what her panel looks like first

## 2016-10-02 NOTE — Addendum Note (Signed)
Addended by: Vic Blackbird F on: 10/02/2016 10:48 AM   Modules accepted: Orders

## 2016-10-02 NOTE — Assessment & Plan Note (Signed)
Continue with Wellbutrin and alprazolam

## 2016-10-02 NOTE — Progress Notes (Addendum)
Subjective:    Patient ID: Debra [name], female    DOB: [birth year], 65 y.o.   MRN: [id number]  Patient presents for CPE (is fasting)  Pt here for CPE Colonoscopy- UTD Immunizations- due for flu shot  Discussed Hep C screening Due for Bone Density Mammogram- UTD  Due for fasting labs Medications reviewed  She had a fall in August 1 she was on a ladder doing some painting on the front of her home she fell down and hit the cement floor on her right side she injured her shoulder and her hip she has significant hematoma and bruising down her leg. She went to her massage therapist which helped her shoulder she iced her hip and leg was able to name [facility] directly after therefore did not seek any care. She still has some bruising and some swelling still present but has some mild tenderness when he palpated there is a lopsided look to her subcutaneous tissue as well around her hip.  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 She is not sleeping very well she noticed that her blood pressure been going up 130s to 135 over 80s to 85 and she just feels on edge. She takes her xanax  typically twice a week once no she could take this more often if needed. She is still taking her Wellbutrin.  Hyperlipidemia she is on pravastatin however which she was taking 40 mg daily was having more muscle aches and pains therefore she decreased to 20 mg started taking red yeast right should not have any difficulties with this. She's also tried taking an alternating dose of 40 and 20 mg and she did okay with that  She also notes that she was seen for UTI a few weeks ago she took Keflex and her symptoms improved but she still has some residual symptoms. She is unable to leave another urine sample would like to have an extended course of the Keflex.  She would like to restart the Bentyl for her IBS Review Of Systems:  GEN- denies  fatigue, fever, weight loss,weakness, recent illness HEENT- denies eye drainage, change in vision, nasal discharge, CVS- denies chest pain, palpitations RESP- denies SOB, cough, wheeze ABD- denies N/V, change in stools, abd pain GU- denies dysuria, hematuria, dribbling, incontinence MSK- + joint pain, muscle aches, injury Neuro- denies headache, dizziness, syncope, seizure activity       Objective:    BP 138/88 (BP Location: Left Arm, Patient Position: Sitting, Cuff Size: Large)   Pulse (!) 54   Temp 98.1 F (36.7 C) (Oral)   Resp 14   Ht 5\' 5"  (1.651 m)   Wt 176 lb (79.8 kg)   SpO2 98%   BMI 29.29 kg/m  GEN- NAD, alert and oriented x3 HEENT- PERRL, EOMI, non injected sclera, pink conjunctiva, MMM, oropharynx clear Neck- Supple, no thyromegaly CVS- RRR, no murmur RESP-CTAB ABD-NABS,soft,NT,ND Psych- normal affect and mood  MSK- Spine NT, neg Hip Rock, good ROM bilat hips, bruise beneath moderate subcutaneous swelling right hip, hematoma not palpated, soft tissue  Right UE- good ROM EXT- No edema Pulses- Radial, DP- 2+        Assessment & Plan:      Problem List Items Addressed This Visit    Thyroid disorder    Off synthroid now greater than 1 year      Relevant Orders   TSH  T3, free   T4, free   Osteopenia    Patient to schedule bone density      Relevant Orders   Vitamin D, 25-hydroxy   Hypertension - Primary    Her looks okay. I think that she is having some mild elevations to distress and not sleeping well. Not to change her Inderal. Advised her to try using the alprazolam a few times a week. Her for sleep. She's had a mono her blood pressures at home after doing this and email me with him in 2-3 weeks      High cholesterol    Recheck lipid panel. We may need to alternate her low on high-dose breathing used a high dosed 3-4 times a week will see what her panel looks like first      Relevant Orders   Lipid panel   Anxiety state    Continue with  Wellbutrin and alprazolam       Other Visit Diagnoses    Routine general medical examination at a health care facility       CPE done, immunizations UTD, mammo and Bone density to be done   Relevant Orders   CBC with Differential/Platelet   Comprehensive metabolic panel   Acute cystitis without hematuria       Treat wiht 2nd round of keflex she tolerated well, Treat QID for 5 days  If her symptoms are still not plate she will return to have a urine sample and culture will be sent    Hematoma of left hip, initial encounter       Based on exam I believe she had large hematoma after fall, which is resolving, as her activites and gait is normal, she does not want any intervention I think this is reasonable.  Hopefully the swelling will go down completely and she will not have the abnormal shape in her hip       Note: This dictation was prepared with Dragon dictation along with smaller phrase technology. Any transcriptional errors that result from this process are unintentional.

## 2016-10-02 NOTE — Patient Instructions (Addendum)
F/U 6 months  Schedule bone density with Mammogram Take the bentyl for your bowels Monitor blood pressure for next few weeks and email me For stress try taking the xanax 1/2 tablet a few times a week  Keflex 500mg  for 5 days

## 2016-10-02 NOTE — Assessment & Plan Note (Signed)
Patient to schedule bone density. 

## 2016-10-02 NOTE — Assessment & Plan Note (Signed)
Her looks okay. I think that she is having some mild elevations to distress and not sleeping well. Not to change her Inderal. Advised her to try using the alprazolam a few times a week. Her for sleep. She's had a mono her blood pressures at home after doing this and email me with him in 2-3 weeks

## 2016-10-03 LAB — VITAMIN D 25 HYDROXY (VIT D DEFICIENCY, FRACTURES): Vit D, 25-Hydroxy: 36 ng/mL (ref 30–100)

## 2016-10-05 ENCOUNTER — Encounter: Payer: Self-pay | Admitting: *Deleted

## 2016-10-05 ENCOUNTER — Other Ambulatory Visit: Payer: Self-pay | Admitting: *Deleted

## 2016-10-05 DIAGNOSIS — R7989 Other specified abnormal findings of blood chemistry: Secondary | ICD-10-CM

## 2016-10-05 DIAGNOSIS — R945 Abnormal results of liver function studies: Principal | ICD-10-CM

## 2016-10-06 ENCOUNTER — Other Ambulatory Visit: Payer: Self-pay | Admitting: Family Medicine

## 2016-10-07 ENCOUNTER — Telehealth: Payer: Self-pay | Admitting: *Deleted

## 2016-10-07 DIAGNOSIS — R7989 Other specified abnormal findings of blood chemistry: Secondary | ICD-10-CM

## 2016-10-07 DIAGNOSIS — R945 Abnormal results of liver function studies: Principal | ICD-10-CM

## 2016-10-07 LAB — HEPATIC FUNCTION PANEL
ALT: 53 U/L — AB (ref 6–29)
AST: 19 U/L (ref 10–35)
Albumin: 4.3 g/dL (ref 3.6–5.1)
Alkaline Phosphatase: 68 U/L (ref 33–130)
Bilirubin, Direct: 0.1 mg/dL (ref <=0.2)
Indirect Bilirubin: 0.2 mg/dL (ref 0.2–1.2)
TOTAL PROTEIN: 6.6 g/dL (ref 6.1–8.1)
Total Bilirubin: 0.3 mg/dL (ref 0.2–1.2)

## 2016-10-07 LAB — GAMMA GT: GGT: 192 U/L — AB (ref 7–51)

## 2016-10-07 NOTE — Telephone Encounter (Signed)
Per labs, order [place] for elevated LFT.   Order placed.

## 2016-10-14 ENCOUNTER — Ambulatory Visit (HOSPITAL_COMMUNITY)
Admission: RE | Admit: 2016-10-14 | Discharge: 2016-10-14 | Disposition: A | Discharge status: Home / Self Care | Payer: BC Managed Care – PPO | Source: Ambulatory Visit | Attending: Family Medicine | Admitting: Family Medicine

## 2016-10-14 ENCOUNTER — Other Ambulatory Visit: Payer: Self-pay | Admitting: Family Medicine

## 2016-10-14 DIAGNOSIS — R7989 Other specified abnormal findings of blood chemistry: Secondary | ICD-10-CM | POA: Insufficient documentation

## 2016-10-14 DIAGNOSIS — R932 Abnormal findings on diagnostic imaging of liver and biliary tract: Secondary | ICD-10-CM | POA: Diagnosis not present

## 2016-10-14 DIAGNOSIS — R945 Abnormal results of liver function studies: Secondary | ICD-10-CM

## 2016-10-16 ENCOUNTER — Other Ambulatory Visit: Payer: Self-pay | Admitting: *Deleted

## 2016-10-16 DIAGNOSIS — K760 Fatty (change of) liver, not elsewhere classified: Secondary | ICD-10-CM

## 2016-10-16 DIAGNOSIS — R945 Abnormal results of liver function studies: Principal | ICD-10-CM

## 2016-10-16 DIAGNOSIS — R7989 Other specified abnormal findings of blood chemistry: Secondary | ICD-10-CM

## 2016-10-19 ENCOUNTER — Other Ambulatory Visit: Payer: Self-pay | Admitting: Family Medicine

## 2016-10-19 NOTE — Telephone Encounter (Signed)
okay

## 2016-10-19 NOTE — Telephone Encounter (Signed)
Ok to refill??  Last office visit 10/02/2016.  Last refill 01/13/2016, #2 refills.

## 2016-10-21 ENCOUNTER — Encounter: Payer: Self-pay | Admitting: Family Medicine

## 2016-10-21 ENCOUNTER — Ambulatory Visit (INDEPENDENT_AMBULATORY_CARE_PROVIDER_SITE_OTHER): Payer: BC Managed Care – PPO | Admitting: Family Medicine

## 2016-10-21 VITALS — BP 144/90 | HR 68 | Temp 98.4°F | Resp 14 | Ht 65.0 in | Wt 179.0 lb

## 2016-10-21 DIAGNOSIS — E7800 Pure hypercholesterolemia, unspecified: Secondary | ICD-10-CM | POA: Diagnosis not present

## 2016-10-21 DIAGNOSIS — R945 Abnormal results of liver function studies: Secondary | ICD-10-CM

## 2016-10-21 DIAGNOSIS — K760 Fatty (change of) liver, not elsewhere classified: Secondary | ICD-10-CM | POA: Insufficient documentation

## 2016-10-21 DIAGNOSIS — R7989 Other specified abnormal findings of blood chemistry: Secondary | ICD-10-CM

## 2016-10-21 NOTE — Telephone Encounter (Signed)
Medication called to pharmacy. 

## 2016-10-21 NOTE — Assessment & Plan Note (Signed)
Fatty liver disease which I think can be reversed with some changes in her diet. They'll liver function tests actually came down fairly quickly so may not have been significantly elevated though she was holding the statin drug. We discussed the staples are treating fatty liver and dietary changes that can be made. We will see how she does on 10 mg of the pravastatin I will recheck her liver function in 2 weeks by see another spike within her body may not be able to tolerate the statin drug in general. With regards to the gallstones discussed symptoms of cholecystitis at this time we are just on a monitor her diet she does not need gallbladder removed

## 2016-10-21 NOTE — Progress Notes (Signed)
   Subjective:    Patient ID: Debra [name], female    DOB: 1950/12/21, 65 y.o.   MRN: [id number]  Patient presents for Wants to discuss gall bladder removal   Pt here to discuss her elevated LFT At her physical exam her labs are done her ALT was elevated at 129. I repeated this 5 days later ALT was down to 53 and I had her hold her statin drug. Her GGT came back elevated at 192 therefore I obtain right upper quadrant ultrasound which showed layering gallstones but no cholecystitis there was also evidence of fatty liver disease. She is here today to review these labs she is also concerned she may need her gallbladder out. She denies any pain with any fatty meals. She does admit to eating meats with a lot of fat as well as cheese and eggs.  She actually went back on pravastatin a couple days ago at 10 mg she has not had any side effects with this dose    Review Of Systems:  GEN- denies fatigue, fever, weight loss,weakness, recent illness HEENT- denies eye drainage, change in vision, nasal discharge, CVS- denies chest pain, palpitations RESP- denies SOB, cough, wheeze ABD- denies N/V, change in stools, abd pain GU- denies dysuria, hematuria, dribbling, incontinence MSK- denies joint pain, muscle aches, injury Neuro- denies headache, dizziness, syncope, seizure activity       Objective:    BP (!) 144/90   Pulse 68   Temp 98.4 F (36.9 C) (Oral)   Resp 14   Ht 5\' 5"  (1.651 m)   Wt 179 lb (81.2 kg)   BMI 29.79 kg/m  GEN- NAD, alert and oriented x3         Assessment & Plan:      Problem List Items Addressed This Visit    Hyperlipidemia   Fatty liver - Primary    Fatty liver disease which I think can be reversed with some changes in her diet. They'll liver function tests actually came down fairly quickly so may not have been significantly elevated though she was holding the statin drug. We discussed the staples are treating fatty liver and dietary changes that can be made.  We will see how she does on 10 mg of the pravastatin I will recheck her liver function in 2 weeks by see another spike within her body may not be able to tolerate the statin drug in general. With regards to the gallstones discussed symptoms of cholecystitis at this time we are just on a monitor her diet she does not need gallbladder removed       Other Visit Diagnoses    Elevated LFTs          Note: This dictation was prepared with Dragon dictation along with smaller phrase technology. Any transcriptional errors that result from this process are unintentional.

## 2016-10-21 NOTE — Patient Instructions (Signed)
F/U as previous CANCEL December LAB AND OV apoointment

## 2016-10-26 ENCOUNTER — Other Ambulatory Visit: Payer: Self-pay | Admitting: Family Medicine

## 2016-11-11 ENCOUNTER — Other Ambulatory Visit: Payer: Self-pay | Admitting: Family Medicine

## 2016-11-11 DIAGNOSIS — Z1231 Encounter for screening mammogram for malignant neoplasm of breast: Secondary | ICD-10-CM

## 2016-11-11 DIAGNOSIS — Z780 Asymptomatic menopausal state: Secondary | ICD-10-CM

## 2016-11-12 ENCOUNTER — Other Ambulatory Visit: Payer: Self-pay | Admitting: Family Medicine

## 2016-11-12 DIAGNOSIS — M8580 Other specified disorders of bone density and structure, unspecified site: Secondary | ICD-10-CM

## 2016-11-18 ENCOUNTER — Other Ambulatory Visit (HOSPITAL_COMMUNITY): Payer: BC Managed Care – PPO

## 2016-11-26 ENCOUNTER — Other Ambulatory Visit: Payer: BC Managed Care – PPO

## 2016-11-26 DIAGNOSIS — R945 Abnormal results of liver function studies: Principal | ICD-10-CM

## 2016-11-26 DIAGNOSIS — K760 Fatty (change of) liver, not elsewhere classified: Secondary | ICD-10-CM

## 2016-11-26 DIAGNOSIS — R7989 Other specified abnormal findings of blood chemistry: Secondary | ICD-10-CM

## 2016-11-27 ENCOUNTER — Ambulatory Visit: Payer: Self-pay | Admitting: Family Medicine

## 2016-11-27 ENCOUNTER — Other Ambulatory Visit: Payer: Self-pay

## 2016-11-27 LAB — COMPREHENSIVE METABOLIC PANEL
ALT: 17 U/L (ref 6–29)
AST: 15 U/L (ref 10–35)
Albumin: 4.2 g/dL (ref 3.6–5.1)
Alkaline Phosphatase: 54 U/L (ref 33–130)
BUN: 19 mg/dL (ref 7–25)
CALCIUM: 9.1 mg/dL (ref 8.6–10.4)
CO2: 23 mmol/L (ref 20–31)
Chloride: 103 mmol/L (ref 98–110)
Creat: 0.95 mg/dL (ref 0.50–0.99)
GLUCOSE: 93 mg/dL (ref 70–99)
POTASSIUM: 4.2 mmol/L (ref 3.5–5.3)
Sodium: 140 mmol/L (ref 135–146)
Total Bilirubin: 0.5 mg/dL (ref 0.2–1.2)
Total Protein: 6.6 g/dL (ref 6.1–8.1)

## 2016-11-27 LAB — GAMMA GT: GGT: 77 U/L — ABNORMAL HIGH (ref 7–51)

## 2016-12-14 ENCOUNTER — Ambulatory Visit (HOSPITAL_COMMUNITY)
Admission: RE | Admit: 2016-12-14 | Discharge: 2016-12-14 | Disposition: A | Discharge status: Home / Self Care | Payer: BC Managed Care – PPO | Source: Ambulatory Visit | Attending: Family Medicine | Admitting: Family Medicine

## 2016-12-14 DIAGNOSIS — M8580 Other specified disorders of bone density and structure, unspecified site: Secondary | ICD-10-CM

## 2016-12-14 DIAGNOSIS — Z780 Asymptomatic menopausal state: Secondary | ICD-10-CM | POA: Insufficient documentation

## 2016-12-14 DIAGNOSIS — Z1231 Encounter for screening mammogram for malignant neoplasm of breast: Secondary | ICD-10-CM

## 2016-12-17 ENCOUNTER — Encounter: Payer: Self-pay | Admitting: *Deleted

## 2016-12-29 ENCOUNTER — Other Ambulatory Visit: Payer: Self-pay | Admitting: Family Medicine

## 2017-01-25 ENCOUNTER — Telehealth: Payer: Self-pay | Admitting: *Deleted

## 2017-01-25 NOTE — Telephone Encounter (Signed)
Received call from patient husband, Dorothyann Peng.   Reports that patient has x1 day of nausea, vomiting, HA, and dizziness. Requested prescription for Tamiflu.   Advised that patient needs to be seen for Tamiflu.   Appointment scheduled.

## 2017-01-26 ENCOUNTER — Other Ambulatory Visit: Payer: Self-pay | Admitting: Family Medicine

## 2017-01-26 ENCOUNTER — Ambulatory Visit: Payer: Self-pay | Admitting: Family Medicine

## 2017-03-29 ENCOUNTER — Other Ambulatory Visit: Payer: Self-pay | Admitting: Family Medicine

## 2017-04-02 ENCOUNTER — Ambulatory Visit: Payer: BC Managed Care – PPO | Admitting: Family Medicine

## 2017-04-19 ENCOUNTER — Other Ambulatory Visit: Payer: Self-pay | Admitting: Family Medicine

## 2017-04-22 ENCOUNTER — Telehealth: Payer: Self-pay | Admitting: *Deleted

## 2017-04-22 MED ORDER — DICLOFENAC SODIUM 75 MG PO TBEC: 75.0000 mg | DELAYED_RELEASE_TABLET | Freq: Every day | ORAL | 0 refills | Status: DC

## 2017-04-22 NOTE — Telephone Encounter (Signed)
Received fax requesting refill on Diclofenac.   Refill appropriate and filled per protocol.

## 2017-06-15 ENCOUNTER — Other Ambulatory Visit: Payer: Self-pay | Admitting: Family Medicine

## 2017-07-06 ENCOUNTER — Other Ambulatory Visit: Payer: Self-pay | Admitting: Family Medicine

## 2017-07-23 ENCOUNTER — Other Ambulatory Visit: Payer: Self-pay | Admitting: Family Medicine

## 2017-07-26 ENCOUNTER — Telehealth: Payer: Self-pay | Admitting: Family Medicine

## 2017-07-26 ENCOUNTER — Other Ambulatory Visit: Payer: Self-pay | Admitting: *Deleted

## 2017-07-26 DIAGNOSIS — I10 Essential (primary) hypertension: Secondary | ICD-10-CM

## 2017-07-26 DIAGNOSIS — E7800 Pure hypercholesterolemia, unspecified: Secondary | ICD-10-CM

## 2017-07-26 DIAGNOSIS — K760 Fatty (change of) liver, not elsewhere classified: Secondary | ICD-10-CM

## 2017-07-26 NOTE — Telephone Encounter (Signed)
Future orders placed and patient placed on Lab Schedule.   Call placed to patient. [place].

## 2017-07-26 NOTE — Telephone Encounter (Signed)
Call placed to patient and patient made aware per VM.  

## 2017-07-26 NOTE — Telephone Encounter (Signed)
New Message  Pt voiced needing labs and she is wanting to come in tomorrow for blood work.  Please add orders if needed

## 2017-07-27 ENCOUNTER — Other Ambulatory Visit: Payer: BC Managed Care – PPO

## 2017-07-27 DIAGNOSIS — E7800 Pure hypercholesterolemia, unspecified: Secondary | ICD-10-CM

## 2017-07-27 DIAGNOSIS — K760 Fatty (change of) liver, not elsewhere classified: Secondary | ICD-10-CM

## 2017-07-27 DIAGNOSIS — R7989 Other specified abnormal findings of blood chemistry: Secondary | ICD-10-CM

## 2017-07-27 DIAGNOSIS — I10 Essential (primary) hypertension: Secondary | ICD-10-CM

## 2017-07-27 DIAGNOSIS — R945 Abnormal results of liver function studies: Secondary | ICD-10-CM

## 2017-07-27 LAB — CBC WITH DIFFERENTIAL/PLATELET
Basophils Absolute: 104 cells/uL (ref 0–200)
Basophils Relative: 2 %
Eosinophils Absolute: 208 cells/uL (ref 15–500)
Eosinophils Relative: 4 %
HEMATOCRIT: 44.1 % (ref 35.0–45.0)
Hemoglobin: 14.5 g/dL (ref 12.0–15.0)
LYMPHS PCT: 33 %
Lymphs Abs: 1716 cells/uL (ref 850–3900)
MCH: 30.8 pg (ref 27.0–33.0)
MCHC: 32.9 g/dL (ref 32.0–36.0)
MCV: 93.6 fL (ref 80.0–100.0)
MONO ABS: 416 {cells}/uL (ref 200–950)
MPV: 10 fL (ref 7.5–12.5)
Monocytes Relative: 8 %
NEUTROS PCT: 53 %
Neutro Abs: 2756 cells/uL (ref 1500–7800)
Platelets: 215 10*3/uL (ref 140–400)
RBC: 4.71 MIL/uL (ref 3.80–5.10)
RDW: 14 % (ref 11.0–15.0)
WBC: 5.2 10*3/uL (ref 3.8–10.8)

## 2017-07-28 LAB — COMPLETE METABOLIC PANEL WITH GFR
ALBUMIN: 4.3 g/dL (ref 3.6–5.1)
ALT: 15 U/L (ref 6–29)
AST: 13 U/L (ref 10–35)
Alkaline Phosphatase: 54 U/L (ref 33–130)
BUN: 23 mg/dL (ref 7–25)
CALCIUM: 9.6 mg/dL (ref 8.6–10.4)
CHLORIDE: 105 mmol/L (ref 98–110)
CO2: 22 mmol/L (ref 20–32)
CREATININE: 1.04 mg/dL — AB (ref 0.50–0.99)
GFR, Est African American: 65 mL/min (ref >=60)
GFR, Est Non African American: 56 mL/min — ABNORMAL LOW (ref >=60)
Glucose, Bld: 101 mg/dL — ABNORMAL HIGH (ref 70–99)
Potassium: 4.2 mmol/L (ref 3.5–5.3)
Sodium: 138 mmol/L (ref 135–146)
Total Bilirubin: 0.6 mg/dL (ref 0.2–1.2)
Total Protein: 6.6 g/dL (ref 6.1–8.1)

## 2017-07-28 LAB — LIPID PANEL
CHOLESTEROL: 242 mg/dL — AB (ref <200)
HDL: 62 mg/dL (ref >50)
LDL Cholesterol: 153 mg/dL — ABNORMAL HIGH (ref <100)
Total CHOL/HDL Ratio: 3.9 Ratio (ref <5.0)
Triglycerides: 137 mg/dL (ref <150)
VLDL: 27 mg/dL (ref <30)

## 2017-07-28 LAB — HEPATIC FUNCTION PANEL
ALBUMIN: 4.3 g/dL (ref 3.6–5.1)
ALK PHOS: 54 U/L (ref 33–130)
ALT: 15 U/L (ref 6–29)
AST: 13 U/L (ref 10–35)
Bilirubin, Direct: 0.1 mg/dL (ref <=0.2)
Indirect Bilirubin: 0.5 mg/dL (ref 0.2–1.2)
TOTAL PROTEIN: 6.6 g/dL (ref 6.1–8.1)
Total Bilirubin: 0.6 mg/dL (ref 0.2–1.2)

## 2017-07-28 LAB — GAMMA GT: GGT: 27 U/L (ref 3–65)

## 2017-07-30 ENCOUNTER — Ambulatory Visit (INDEPENDENT_AMBULATORY_CARE_PROVIDER_SITE_OTHER): Payer: BC Managed Care – PPO | Admitting: Family Medicine

## 2017-07-30 ENCOUNTER — Encounter: Payer: Self-pay | Admitting: Family Medicine

## 2017-07-30 ENCOUNTER — Ambulatory Visit: Payer: BC Managed Care – PPO | Admitting: Family Medicine

## 2017-07-30 VITALS — BP 136/74 | HR 68 | Temp 98.0°F | Resp 14 | Ht 65.0 in | Wt 180.0 lb

## 2017-07-30 DIAGNOSIS — F330 Major depressive disorder, recurrent, mild: Secondary | ICD-10-CM | POA: Diagnosis not present

## 2017-07-30 DIAGNOSIS — K760 Fatty (change of) liver, not elsewhere classified: Secondary | ICD-10-CM

## 2017-07-30 DIAGNOSIS — I10 Essential (primary) hypertension: Secondary | ICD-10-CM | POA: Diagnosis not present

## 2017-07-30 DIAGNOSIS — E7800 Pure hypercholesterolemia, unspecified: Secondary | ICD-10-CM

## 2017-07-30 NOTE — Assessment & Plan Note (Signed)
Cholesterol has gone up back cholesterol is now up to 150. However she does not tolerate any higher doses statin drug. We discussed a processed foods or red meats that she can cut out of her diet. At this time she does not want a challenge with the higher doses we've tried this before and she had significant side effects. We also discussed possibly adding another medication such as zetis but she prefers to hold off on this at this time. She does have strong family history of hyperlipidemia as well.

## 2017-07-30 NOTE — Assessment & Plan Note (Signed)
Liver function as well as GGT are normal

## 2017-07-30 NOTE — Assessment & Plan Note (Signed)
Blood pressures control medication medication 

## 2017-07-30 NOTE — Progress Notes (Signed)
   Subjective:    Patient ID: Debra [name], female    DOB: Sep 01, 1951, 66 y.o.   MRN: [id number]  Patient presents for 6 month F/U (has had labs)   Pt here to f/u chronic medical problenms , medications reviewed Labs reviewed   She's history of fatty liver and hyperlipidemia. She is currently on pravastatin 20 mg once a day which is the most that she can tolerate. Early in the year she had elevated GGT as well as liver function which have been slowly coming down to normal. She is also watching her diet  Major depression she is still taking her Wellbutrin as prescribed she rarely uses the clonazepam only about 2-3 times per month  Review Of Systems:  GEN- denies fatigue, fever, weight loss,weakness, recent illness HEENT- denies eye drainage, change in vision, nasal discharge, CVS- denies chest pain, palpitations RESP- denies SOB, cough, wheeze ABD- denies N/V, change in stools, abd pain GU- denies dysuria, hematuria, dribbling, incontinence MSK- denies joint pain, muscle aches, injury Neuro- denies headache, dizziness, syncope, seizure activity       Objective:    BP 136/74   Pulse 68   Temp 98 F (36.7 C) (Oral)   Resp 14   Ht 5\' 5"  (1.651 m)   Wt 180 lb (81.6 kg)   SpO2 96%   BMI 29.95 kg/m  GEN- NAD, alert and oriented x3 CVS- RRR, no murmur RESP-CTAB Psych- normal affect and mood  EXT- No edema Pulses- Radial,- 2+        Assessment & Plan:      Problem List Items Addressed This Visit      Unprioritized   Hypertension    Blood pressures control medication medication      Hyperlipidemia - Primary    Cholesterol has gone up back cholesterol is now up to 150. However she does not tolerate any higher doses statin drug. We discussed a processed foods or red meats that she can cut out of her diet. At this time she does not want a challenge with the higher doses we've tried this before and she had significant side effects. We also discussed possibly adding  another medication such as zetis but she prefers to hold off on this at this time. She does have strong family history of hyperlipidemia as well.      Fatty liver    Liver function as well as GGT are normal      Depression    Depression is well controlled with Wellbutrin no change today she is planning to retire this year         Note: This dictation was prepared with Dragon dictation along with smaller phrase technology. Any transcriptional errors that result from this process are unintentional.

## 2017-07-30 NOTE — Patient Instructions (Addendum)
F/U 6 months Physical  

## 2017-07-30 NOTE — Assessment & Plan Note (Signed)
Depression is well controlled with Wellbutrin no change today she is planning to retire this year

## 2017-08-05 ENCOUNTER — Other Ambulatory Visit: Payer: Self-pay | Admitting: Family Medicine

## 2017-08-23 ENCOUNTER — Other Ambulatory Visit: Payer: Self-pay | Admitting: Family Medicine

## 2017-08-23 NOTE — Telephone Encounter (Signed)
Okay to refill? 

## 2017-08-23 NOTE — Telephone Encounter (Signed)
Ok to refill??  Last office visit 07/30/2017.  Last refill 10/21/2016, #2 refills.

## 2017-08-23 NOTE — Telephone Encounter (Signed)
Medication called to pharmacy. 

## 2017-10-01 ENCOUNTER — Other Ambulatory Visit: Payer: Self-pay | Admitting: Family Medicine

## 2017-10-20 ENCOUNTER — Other Ambulatory Visit: Payer: Self-pay | Admitting: Family Medicine

## 2017-11-09 ENCOUNTER — Other Ambulatory Visit: Payer: Self-pay | Admitting: Family Medicine

## 2017-12-09 ENCOUNTER — Other Ambulatory Visit: Payer: Self-pay | Admitting: Family Medicine

## 2017-12-17 ENCOUNTER — Other Ambulatory Visit: Payer: Self-pay | Admitting: Family Medicine

## 2017-12-17 DIAGNOSIS — Z1231 Encounter for screening mammogram for malignant neoplasm of breast: Secondary | ICD-10-CM

## 2017-12-27 ENCOUNTER — Ambulatory Visit (HOSPITAL_COMMUNITY)
Admission: RE | Admit: 2017-12-27 | Discharge: 2017-12-27 | Disposition: A | Discharge status: Home / Self Care | Payer: BC Managed Care – PPO | Source: Ambulatory Visit | Attending: Family Medicine | Admitting: Family Medicine

## 2017-12-27 DIAGNOSIS — Z1231 Encounter for screening mammogram for malignant neoplasm of breast: Secondary | ICD-10-CM | POA: Insufficient documentation

## 2017-12-31 ENCOUNTER — Other Ambulatory Visit: Payer: Self-pay | Admitting: Family Medicine

## 2018-02-09 ENCOUNTER — Other Ambulatory Visit: Payer: Self-pay | Admitting: Family Medicine

## 2018-03-01 ENCOUNTER — Other Ambulatory Visit: Payer: Self-pay | Admitting: Family Medicine

## 2018-04-04 ENCOUNTER — Other Ambulatory Visit: Payer: Self-pay | Admitting: Family Medicine

## 2018-05-03 ENCOUNTER — Other Ambulatory Visit: Payer: Self-pay | Admitting: Family Medicine

## 2018-05-25 ENCOUNTER — Other Ambulatory Visit: Payer: BC Managed Care – PPO

## 2018-05-25 DIAGNOSIS — I10 Essential (primary) hypertension: Secondary | ICD-10-CM

## 2018-05-25 DIAGNOSIS — K760 Fatty (change of) liver, not elsewhere classified: Secondary | ICD-10-CM

## 2018-05-25 DIAGNOSIS — E7800 Pure hypercholesterolemia, unspecified: Secondary | ICD-10-CM

## 2018-05-25 LAB — COMPREHENSIVE METABOLIC PANEL
AG RATIO: 1.9 (calc) (ref 1.0–2.5)
ALT: 17 U/L (ref 6–29)
AST: 13 U/L (ref 10–35)
Albumin: 4.1 g/dL (ref 3.6–5.1)
Alkaline phosphatase (APISO): 46 U/L (ref 33–130)
BUN: 19 mg/dL (ref 7–25)
CALCIUM: 9.6 mg/dL (ref 8.6–10.4)
CO2: 28 mmol/L (ref 20–32)
Chloride: 106 mmol/L (ref 98–110)
Creat: 0.96 mg/dL (ref 0.50–0.99)
GLUCOSE: 96 mg/dL (ref 65–99)
Globulin: 2.2 g/dL (calc) (ref 1.9–3.7)
Potassium: 4.5 mmol/L (ref 3.5–5.3)
Sodium: 140 mmol/L (ref 135–146)
Total Bilirubin: 0.7 mg/dL (ref 0.2–1.2)
Total Protein: 6.3 g/dL (ref 6.1–8.1)

## 2018-05-25 LAB — HEPATIC FUNCTION PANEL
AG Ratio: 1.9 (calc) (ref 1.0–2.5)
ALBUMIN MSPROF: 4.1 g/dL (ref 3.6–5.1)
ALT: 17 U/L (ref 6–29)
AST: 13 U/L (ref 10–35)
Alkaline phosphatase (APISO): 46 U/L (ref 33–130)
BILIRUBIN TOTAL: 0.7 mg/dL (ref 0.2–1.2)
Bilirubin, Direct: 0.1 mg/dL (ref 0.0–0.2)
Globulin: 2.2 g/dL (calc) (ref 1.9–3.7)
Indirect Bilirubin: 0.6 mg/dL (calc) (ref 0.2–1.2)
Total Protein: 6.3 g/dL (ref 6.1–8.1)

## 2018-05-25 LAB — CBC WITH DIFFERENTIAL/PLATELET
BASOS ABS: 60 {cells}/uL (ref 0–200)
Basophils Relative: 1.2 %
EOS ABS: 190 {cells}/uL (ref 15–500)
Eosinophils Relative: 3.8 %
HCT: 41.1 % (ref 35.0–45.0)
HEMOGLOBIN: 14.3 g/dL (ref 11.7–15.5)
Lymphs Abs: 1790 cells/uL (ref 850–3900)
MCH: 31.6 pg (ref 27.0–33.0)
MCHC: 34.8 g/dL (ref 32.0–36.0)
MCV: 90.7 fL (ref 80.0–100.0)
MONOS PCT: 9.5 %
MPV: 10.7 fL (ref 7.5–12.5)
NEUTROS ABS: 2485 {cells}/uL (ref 1500–7800)
NEUTROS PCT: 49.7 %
Platelets: 213 10*3/uL (ref 140–400)
RBC: 4.53 10*6/uL (ref 3.80–5.10)
RDW: 13 % (ref 11.0–15.0)
TOTAL LYMPHOCYTE: 35.8 %
WBC mixed population: 475 cells/uL (ref 200–950)
WBC: 5 10*3/uL (ref 3.8–10.8)

## 2018-05-25 LAB — LIPID PANEL
CHOL/HDL RATIO: 4.2 (calc) (ref <5.0)
Cholesterol: 221 mg/dL — ABNORMAL HIGH (ref <200)
HDL: 53 mg/dL (ref >50)
LDL Cholesterol (Calc): 137 mg/dL (calc) — ABNORMAL HIGH
NON-HDL CHOLESTEROL (CALC): 168 mg/dL — AB (ref <130)
TRIGLYCERIDES: 174 mg/dL — AB (ref <150)

## 2018-05-25 LAB — GAMMA GT: GGT: 22 U/L (ref 3–65)

## 2018-05-31 ENCOUNTER — Ambulatory Visit: Payer: BC Managed Care – PPO | Admitting: Family Medicine

## 2018-06-20 ENCOUNTER — Encounter: Payer: Self-pay | Admitting: Family Medicine

## 2018-06-20 ENCOUNTER — Other Ambulatory Visit: Payer: Self-pay

## 2018-06-20 ENCOUNTER — Ambulatory Visit: Payer: BC Managed Care – PPO | Admitting: Family Medicine

## 2018-06-20 VITALS — BP 130/72 | HR 60 | Temp 97.9°F | Resp 14 | Ht 65.0 in | Wt 180.0 lb

## 2018-06-20 DIAGNOSIS — I10 Essential (primary) hypertension: Secondary | ICD-10-CM

## 2018-06-20 DIAGNOSIS — F330 Major depressive disorder, recurrent, mild: Secondary | ICD-10-CM | POA: Diagnosis not present

## 2018-06-20 DIAGNOSIS — E7800 Pure hypercholesterolemia, unspecified: Secondary | ICD-10-CM | POA: Diagnosis not present

## 2018-06-20 DIAGNOSIS — K580 Irritable bowel syndrome with diarrhea: Secondary | ICD-10-CM

## 2018-06-20 DIAGNOSIS — K760 Fatty (change of) liver, not elsewhere classified: Secondary | ICD-10-CM | POA: Diagnosis not present

## 2018-06-20 DIAGNOSIS — M1990 Unspecified osteoarthritis, unspecified site: Secondary | ICD-10-CM | POA: Diagnosis not present

## 2018-06-20 MED ORDER — ELUXADOLINE 75 MG PO TABS: 1.0000 | ORAL_TABLET | Freq: Two times a day (BID) | ORAL | 3 refills | Status: DC

## 2018-06-20 NOTE — Assessment & Plan Note (Signed)
Well controlled 

## 2018-06-20 NOTE — Assessment & Plan Note (Signed)
Referral back to rheumatology

## 2018-06-20 NOTE — Assessment & Plan Note (Addendum)
Continue wellbutrin doing well

## 2018-06-20 NOTE — Assessment & Plan Note (Addendum)
Continue pravastatin Levels elevated but does not tolerate higher doses of statinn Runs in family

## 2018-06-20 NOTE — Patient Instructions (Addendum)
Schedule physical for Dec/Jan Try the viberzi  Referral to Rheuamology - Dr. Amil Amen

## 2018-06-20 NOTE — Assessment & Plan Note (Signed)
Trial of low dose Viberzi

## 2018-06-20 NOTE — Progress Notes (Signed)
   Subjective:    Patient ID: Debra [name], female    DOB: March 07, 1951, 67 y.o.   MRN: [id number]  Patient presents for Follow-up (is not fasting)    Pt here to f/u chronic medical problems.    Hyperlipidemia-  Recent fasting labs, LDL still elevated at  137 but improved from last year   She has underlying fatty liver, LFT normal and GGT   she is currently on pravastatin 1/2 tablet does not tolerate higher doses      HTN-  Controlled on BP meds   MDD- taking wellbutrin, has ativan as needed, about 3 times a month  but does not take regulary   She is now retired from teaching   IBS diarrhea- takes bentyl and gas x , wanted to see if anything was new to try, has had GI work up, dietician   Arthritis in left thumb- stiffness and pain  Would like to see a new rheumatologist, last seen a few years ago   Had [facility] removed nose      Review Of Systems:  GEN- denies fatigue, fever, weight loss,weakness, recent illness HEENT- denies eye drainage, change in vision, nasal discharge, CVS- denies chest pain, palpitations RESP- denies SOB, cough, wheeze ABD- denies N/V, change in stools, abd pain GU- denies dysuria, hematuria, dribbling, incontinence MSK- + joint pain, muscle aches, injury Neuro- denies headache, dizziness, syncope, seizure activity       Objective:    BP 130/72   Pulse 60   Temp 97.9 F (36.6 C) (Oral)   Resp 14   Ht 5\' 5"  (1.651 m)   Wt 180 lb (81.6 kg)   SpO2 99%   BMI 29.95 kg/m  GEN- NAD, alert and oriented x3 HEENT- PERRL, EOMI, non injected sclera, pink conjunctiva, MMM, oropharynx clear Neck- Supple, no thyromegaly CVS- RRR, no murmur RESP-CTAB ABD-NABS,soft,NT,ND EXT- No edema MSK bilat hands, prominence at MIP, PIP bilat hands, left thumb, stiff ROM,no edema, able to make first  Psych- normal affect and mood Pulses- Radial, DP- 2+        Assessment & Plan:      Problem List Items Addressed This Visit      Unprioritized   Arthritis     Referral back to rheumatology      Relevant Medications   aspirin EC 81 MG tablet   Depression    Continue wellbutrin doing well      Fatty liver   Hyperlipidemia - Primary    Continue pravastatin Levels elevated but does not tolerate higher doses of statinn Runs in family      Relevant Medications   aspirin EC 81 MG tablet   Hypertension    Well controlled      Relevant Medications   aspirin EC 81 MG tablet   IBS (irritable bowel syndrome)    Trial of low dose Viberzi      Relevant Medications   Eluxadoline (VIBERZI) 75 MG TABS      Note: This dictation was prepared with Dragon dictation along with smaller phrase technology. Any transcriptional errors that result from this process are unintentional.

## 2018-06-23 DIAGNOSIS — L820 Inflamed seborrheic keratosis: Secondary | ICD-10-CM | POA: Diagnosis not present

## 2018-06-23 DIAGNOSIS — Z08 Encounter for follow-up examination after completed treatment for malignant neoplasm: Secondary | ICD-10-CM | POA: Diagnosis not present

## 2018-06-23 DIAGNOSIS — Z85828 Personal history of other malignant neoplasm of skin: Secondary | ICD-10-CM | POA: Diagnosis not present

## 2018-06-23 DIAGNOSIS — Z1283 Encounter for screening for malignant neoplasm of skin: Secondary | ICD-10-CM | POA: Diagnosis not present

## 2018-06-23 DIAGNOSIS — D225 Melanocytic nevi of trunk: Secondary | ICD-10-CM | POA: Diagnosis not present

## 2018-06-27 ENCOUNTER — Telehealth: Payer: Self-pay | Admitting: Rheumatology

## 2018-06-27 NOTE — Telephone Encounter (Signed)
Patient called to let Dr. Estanislado Pandy know that she was requesting her medical records from her two office visits (05/24/14 and 07/11/14 in [facility]).  Patient requested that they be faxed to Dr. Vic Blackbird [phone].  Patient also requested that the records be sent to the hand specialist that Dr. Buelah Manis recommended.  Patient was unable to remember the name of the hand specialist or the office name, but requested it be sent to [facility] at [place] [phone].

## 2018-06-28 NOTE — Telephone Encounter (Signed)
IC-advised patient that we need signed release form before we can send out her records as she wants. I mailed the release form to her per her request.

## 2018-07-04 ENCOUNTER — Other Ambulatory Visit: Payer: Self-pay | Admitting: Family Medicine

## 2018-07-04 NOTE — Telephone Encounter (Signed)
Ok to refill Lorazepam??  Last office visit 06/20/2018.  Last refill 08/23/2017, #2 refills.

## 2018-07-14 ENCOUNTER — Encounter: Payer: Self-pay | Admitting: Family Medicine

## 2018-08-02 ENCOUNTER — Other Ambulatory Visit: Payer: Self-pay | Admitting: Family Medicine

## 2018-09-20 ENCOUNTER — Ambulatory Visit (INDEPENDENT_AMBULATORY_CARE_PROVIDER_SITE_OTHER): Payer: Medicare Other

## 2018-09-20 DIAGNOSIS — Z23 Encounter for immunization: Secondary | ICD-10-CM | POA: Diagnosis not present

## 2018-09-20 NOTE — Progress Notes (Signed)
Patient was in office for high dose flu vaccine.Patient received vaccine in her right deltoid. Patient tolerated well  

## 2018-10-04 ENCOUNTER — Other Ambulatory Visit: Payer: Self-pay | Admitting: Family Medicine

## 2018-10-04 ENCOUNTER — Other Ambulatory Visit: Payer: Self-pay

## 2018-10-04 ENCOUNTER — Encounter: Payer: Self-pay | Admitting: Family Medicine

## 2018-10-04 ENCOUNTER — Ambulatory Visit (INDEPENDENT_AMBULATORY_CARE_PROVIDER_SITE_OTHER): Payer: Medicare Other | Admitting: Family Medicine

## 2018-10-04 VITALS — BP 118/78 | HR 50 | Temp 98.4°F | Resp 14 | Ht 65.0 in | Wt 176.0 lb

## 2018-10-04 DIAGNOSIS — J019 Acute sinusitis, unspecified: Secondary | ICD-10-CM

## 2018-10-04 MED ORDER — AMOXICILLIN-POT CLAVULANATE 875-125 MG PO TABS: 1.0000 | ORAL_TABLET | Freq: Two times a day (BID) | ORAL | 0 refills | Status: AC

## 2018-10-04 MED ORDER — FLUTICASONE PROPIONATE 50 MCG/ACT NA SUSP: 2.0000 | Freq: Every day | NASAL | 6 refills | Status: DC

## 2018-10-04 NOTE — Progress Notes (Signed)
Patient ID: Debra [name], female    DOB: May 07, 1951, 67 y.o.   MRN: [id number]  PCP: Alycia Rossetti, MD  Chief Complaint  Patient presents with  . Sinus Pressure    states that she has been flying a lot lately and has pressure in head and ears    Subjective:   Debra [name] is a 67 y.o. female, presents to clinic with CC of 5 days of gradually worsening sinus and ear pain and pressure, feels like shes "under water."  Ears are popping clicking. She has pain in her b/l maxillary sinuses and frontal sinuses, worse with positional changes.  She has associated nose bleeds, which she reports she usually has when she has a sinus infection, and today with worsening symptoms she does feel like she has swollen lymph nodes from her ear to her neck and she is had a mild associated intermittent scratchy sore throat.  Nuys any fevers, sweats, chills, nausea, vertigo.   Patient Active Problem List   Diagnosis Date Noted  . Fatty liver 10/21/2016  . Thyroid disorder 06/11/2015  . Muscle ache of extremity 11/27/2014  . Anxiety state 09/23/2014  . Arthritis 07/24/2014  . Depression 06/13/2014  . Osteopenia 02/11/2014  . Pain in joint, multiple sites 02/11/2014  . Infected epidermoid cyst 02/11/2014  . Vaginal atrophy 08/17/2013  . Bloating 02/15/2013  . IBS (irritable bowel syndrome) 10/27/2012  . Hyperlipidemia 01/25/2012  . Hypertension 01/25/2012  . Iron deficiency anemia 01/19/2012     Prior to Admission medications   Medication Sig Start Date End Date Taking? Authorizing Provider  aspirin EC 81 MG tablet Take 81 mg by mouth daily.   Yes [provider]  buPROPion (WELLBUTRIN XL) 300 MG 24 hr tablet TAKE (1) TABLET BY MOUTH ONCE DAILY. 07/04/18  Yes Emm[place], Modena Nunnery, MD  CRANBERRY FRUIT PO Take by mouth.   Yes [provider]  Eluxadoline (VIBERZI) 75 MG TABS Take 1 tablet by mouth 2 (two) times daily. 06/20/18  Yes [place], Modena Nunnery, MD  ferrous sulfate 325  (65 FE) MG tablet Take 325 mg by mouth daily with breakfast.   Yes [provider]  GARLIC OIL PO Take by mouth.   Yes [provider]  LORazepam (ATIVAN) 0.5 MG tablet TAKE 1 TABLET TWICE DAILY AS NEEDED FOR AGITATION AND/OR ANXIETY. 08/23/17  Yes [place], Modena Nunnery, MD  mometasone (ELOCON) 0.1 % cream Apply 1 application topically daily. 07/22/15  Yes [place], Modena Nunnery, MD  Multiple Vitamin (MULITIVITAMIN WITH MINERALS) TABS Take 1 tablet by mouth daily.   Yes [provider]  omeprazole (PRILOSEC) 20 MG capsule TAKE 2 CAPSULES BY MOUTH ONCE DAILY 05/24/13  Yes Setzer, Terri L, NP  pravastatin (PRAVACHOL) 40 MG tablet TAKE (1/2) TABLET BY MOUTH ONCE DAILY. 07/04/18  Yes [place], Modena Nunnery, MD  propranolol ER (INDERAL LA) 80 MG 24 hr capsule TAKE (1) CAPSULE BY MOUTH AT BEDTIME. 08/02/18  Yes [place], Modena Nunnery, MD     Allergies  Allergen Reactions  . Codeine Nausea And Vomiting  . Morphine And Related Nausea And Vomiting  . Sulfa Antibiotics Hives and Itching     Review of Systems  Constitutional: Negative.  Negative for activity change, appetite change, chills, diaphoresis, fatigue, fever and unexpected weight change.  HENT: Negative for dental problem, drooling, ear discharge, facial swelling, mouth sores, postnasal drip, rhinorrhea, tinnitus, trouble swallowing and voice change.   Eyes: Negative.  Negative for photophobia, pain, discharge, redness  and itching.  Respiratory: Negative.  Negative for apnea, cough, chest tightness, shortness of breath and wheezing.   Cardiovascular: Negative.  Negative for chest pain.  Gastrointestinal: Negative.  Negative for abdominal pain, diarrhea, nausea and vomiting.  Endocrine: Negative.   Genitourinary: Negative.   Musculoskeletal: Negative.  Negative for neck pain and neck stiffness.  Skin: Negative.  Negative for color change, pallor, rash and wound.  Allergic/Immunologic: Negative for immunocompromised state.    Neurological: Negative for dizziness, seizures, syncope, facial asymmetry, light-headedness and numbness.  Hematological: Positive for adenopathy. Does not bruise/bleed easily.  All other systems reviewed and are negative.      Objective:    Vitals:   10/04/18 0809  BP: 118/78  Pulse: (!) 50  Resp: 14  Temp: 98.4 F (36.9 C)  TempSrc: Oral  SpO2: 96%  Weight: 176 lb (79.8 kg)  Height: 5\' 5"  (1.651 m)      Physical Exam  Constitutional: She appears well-developed and well-nourished. She is cooperative.  Non-toxic appearance. She does not have a sickly appearance. No distress.  Mildly ill-appearing female, appears stated age, no acute distress, nontoxic  HENT:  Head: Normocephalic and atraumatic. Head is without right periorbital erythema and without left periorbital erythema.  Right Ear: Hearing, external ear and ear canal normal. No drainage, swelling or tenderness. No mastoid tenderness. Tympanic membrane is not injected.  Left Ear: Hearing, external ear and ear canal normal. No drainage, swelling or tenderness. No mastoid tenderness. Tympanic membrane is not injected.  Nose: Mucosal edema and rhinorrhea present. No epistaxis. Right sinus exhibits maxillary sinus tenderness and frontal sinus tenderness. Left sinus exhibits maxillary sinus tenderness and frontal sinus tenderness.  Mouth/Throat: Uvula is midline and mucous membranes are normal. Mucous membranes are not pale, not dry and not cyanotic. No trismus in the jaw. No uvula swelling. Posterior oropharyngeal erythema present. No oropharyngeal exudate, posterior oropharyngeal edema or tonsillar abscesses. Tonsils are 0 on the right. Tonsils are 0 on the left. No tonsillar exudate.  TM bilaterally dull, but normal cone of light, no erythema  Eyes: Conjunctivae are normal. Right eye exhibits no discharge. Left eye exhibits no discharge.  Neck: Trachea normal, normal range of motion, full passive range of motion without pain and  phonation normal. Neck supple. No tracheal deviation present.  Cardiovascular: Normal rate and regular rhythm.  Pulmonary/Chest: Effort normal. No stridor. No respiratory distress.  Musculoskeletal: Normal range of motion.  Lymphadenopathy:       Head (right side): Submandibular and tonsillar adenopathy present. No preauricular and no posterior auricular adenopathy present.       Head (left side): Submandibular and tonsillar adenopathy present. No preauricular and no posterior auricular adenopathy present.    She has cervical adenopathy (mild).  Neurological: She is alert. She exhibits normal muscle tone. Coordination normal.  Skin: Skin is warm and dry. No rash noted. She is not diaphoretic.  Psychiatric: She has a normal mood and affect. Her behavior is normal.  Nursing note and vitals reviewed.         Assessment & Plan:      ICD-10-CM   1. Acute sinusitis, recurrence not specified, unspecified location J01.90 amoxicillin-clavulanate (AUGMENTIN) 875-125 MG tablet    fluticasone (FLONASE) 50 MCG/ACT nasal spray    Presentation consistent with acute sinusitis we will treat for bacterial sinusitis with Augmentin, encouraged to continue daily nasal spray and to add a local steroid nasal spray to help with congestion and ear symptoms.      Kristeen Miss  Lucio Edward, PA-C 10/04/18 8:14 AM

## 2018-10-06 ENCOUNTER — Other Ambulatory Visit: Payer: Self-pay | Admitting: Family Medicine

## 2018-11-18 ENCOUNTER — Other Ambulatory Visit: Payer: Self-pay | Admitting: Family Medicine

## 2018-11-18 DIAGNOSIS — Z1231 Encounter for screening mammogram for malignant neoplasm of breast: Secondary | ICD-10-CM

## 2018-12-02 ENCOUNTER — Other Ambulatory Visit: Payer: Medicare Other

## 2018-12-06 ENCOUNTER — Encounter: Payer: BC Managed Care – PPO | Admitting: Family Medicine

## 2018-12-29 ENCOUNTER — Encounter (HOSPITAL_COMMUNITY): Payer: Self-pay

## 2018-12-29 ENCOUNTER — Ambulatory Visit (HOSPITAL_COMMUNITY)
Admission: RE | Admit: 2018-12-29 | Discharge: 2018-12-29 | Disposition: A | Discharge status: Home / Self Care | Payer: Medicare Other | Source: Ambulatory Visit | Attending: Family Medicine | Admitting: Family Medicine

## 2018-12-29 DIAGNOSIS — Z1231 Encounter for screening mammogram for malignant neoplasm of breast: Secondary | ICD-10-CM | POA: Diagnosis present

## 2019-01-18 ENCOUNTER — Other Ambulatory Visit: Payer: Self-pay | Admitting: Family Medicine

## 2019-02-24 ENCOUNTER — Other Ambulatory Visit: Payer: Self-pay

## 2019-02-24 ENCOUNTER — Ambulatory Visit: Payer: Medicare Other | Admitting: Family Medicine

## 2019-02-24 VITALS — BP 104/66 | HR 59 | Temp 98.3°F | Resp 18 | Wt 174.8 lb

## 2019-02-24 DIAGNOSIS — F330 Major depressive disorder, recurrent, mild: Secondary | ICD-10-CM | POA: Diagnosis not present

## 2019-02-24 DIAGNOSIS — J0100 Acute maxillary sinusitis, unspecified: Secondary | ICD-10-CM | POA: Diagnosis not present

## 2019-02-24 DIAGNOSIS — I10 Essential (primary) hypertension: Secondary | ICD-10-CM | POA: Diagnosis not present

## 2019-02-24 DIAGNOSIS — E7800 Pure hypercholesterolemia, unspecified: Secondary | ICD-10-CM | POA: Diagnosis not present

## 2019-02-24 MED ORDER — FLUTICASONE PROPIONATE 50 MCG/ACT NA SUSP: 2.0000 | Freq: Every day | NASAL | 6 refills | Status: DC

## 2019-02-24 MED ORDER — AMOXICILLIN 875 MG PO TABS: 875.0000 mg | ORAL_TABLET | Freq: Two times a day (BID) | ORAL | 0 refills | Status: DC

## 2019-02-24 MED ORDER — BUPROPION HCL ER (XL) 300 MG PO TB24: ORAL_TABLET | ORAL | 3 refills | Status: DC

## 2019-02-24 MED ORDER — PROPRANOLOL HCL ER 80 MG PO CP24: ORAL_CAPSULE | ORAL | 3 refills | Status: DC

## 2019-02-24 MED ORDER — PRAVASTATIN SODIUM 40 MG PO TABS: ORAL_TABLET | ORAL | 3 refills | Status: DC

## 2019-02-24 MED ORDER — LORAZEPAM 0.5 MG PO TABS: ORAL_TABLET | ORAL | 2 refills | Status: DC

## 2019-02-24 NOTE — Progress Notes (Signed)
    Subjective:    Patient ID: Debra [name], female    DOB: [birth year], 68 y.o.   MRN: [id number]  Patient presents for Sinusitis (x2 days,taken tylenol for headache)  Sinus pressure and drainage for past week, worse over past 2 days, feels ears clogged up, has noticed swelling beneath eyes and cheeks No fever, no travel  Tried OTC meds, nasal sprays no improvement         Review Of Systems:  GEN- denies fatigue, fever, weight loss,weakness, recent illness HEENT- denies eye drainage, change in vision, +nasal discharge, CVS- denies chest pain, palpitations RESP- denies SOB, cough, wheeze ABD- denies N/V, change in stools, abd pain GU- denies dysuria, hematuria, dribbling, incontinence MSK- denies joint pain, muscle aches, injury Neuro- + headache, dizziness, syncope, seizure activity       Objective:    BP 104/66   Pulse (!) 59   Temp 98.3 F (36.8 C)   Resp 18   Wt 174 lb 12.8 oz (79.3 kg)   SpO2 95%   BMI 29.09 kg/m  GEN- NAD, alert and oriented x3 HEENT- PERRL, EOMI, non injected sclera, pink conjunctiva, MMM, oropharynx clear, TM clear bilat no effusion,  + maxillary sinus tenderness, inflammed turbinates,  Nasal drainage  Neck- Supple, no LAD CVS- RRR, no murmur RESP-CTAB EXT- No edema Pulses- Radial 2+           Assessment & Plan:      Problem List Items Addressed This Visit      Unprioritized   Depression    Doing well on wellbutrin, 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,no changes       Relevant Medications   LORazepam (ATIVAN) 0.5 MG tablet   buPROPion (WELLBUTRIN XL) 300 MG 24 hr tablet   Hyperlipidemia    Continue statin drug, plan to recheck lipids in fall      Relevant Medications   propranolol ER (INDERAL LA) 80 MG 24 hr capsule   pravastatin (PRAVACHOL) 40 MG tablet   Hypertension    Controlled continue Inderal      Relevant Medications   propranolol ER (INDERAL LA) 80 MG 24 hr capsule   pravastatin  (PRAVACHOL) 40 MG tablet    Other Visit Diagnoses    Acute maxillary sinusitis, recurrence not specified    -  Primary   Treat with amoxicillin, nasa steroid and saline, oral anti-histamine   Relevant Medications   amoxicillin (AMOXIL) 875 MG tablet   fluticasone (FLONASE) 50 MCG/ACT nasal spray      Note: This dictation was prepared with Dragon dictation along with smaller phrase technology. Any transcriptional errors that result from this process are unintentional.

## 2019-02-24 NOTE — Patient Instructions (Signed)
Schedule physical for the fall

## 2019-02-26 ENCOUNTER — Encounter: Payer: Self-pay | Admitting: Family Medicine

## 2019-02-26 NOTE — Assessment & Plan Note (Signed)
Controlled continue Inderal

## 2019-02-26 NOTE — Assessment & Plan Note (Signed)
Doing well on wellbutrin, more relaxed in her retired state from [employer]

## 2019-02-26 NOTE — Assessment & Plan Note (Signed)
Continue statin drug, plan to recheck lipids in fall

## 2019-08-15 ENCOUNTER — Other Ambulatory Visit: Payer: Medicare Other

## 2019-08-16 ENCOUNTER — Other Ambulatory Visit: Payer: Medicare Other

## 2019-08-16 ENCOUNTER — Other Ambulatory Visit: Payer: Self-pay

## 2019-08-16 DIAGNOSIS — E7800 Pure hypercholesterolemia, unspecified: Secondary | ICD-10-CM

## 2019-08-16 DIAGNOSIS — D509 Iron deficiency anemia, unspecified: Secondary | ICD-10-CM

## 2019-08-16 DIAGNOSIS — E079 Disorder of thyroid, unspecified: Secondary | ICD-10-CM

## 2019-08-16 DIAGNOSIS — I10 Essential (primary) hypertension: Secondary | ICD-10-CM

## 2019-08-18 ENCOUNTER — Encounter: Payer: Self-pay | Admitting: Family Medicine

## 2019-08-18 ENCOUNTER — Ambulatory Visit (INDEPENDENT_AMBULATORY_CARE_PROVIDER_SITE_OTHER): Payer: Medicare Other | Admitting: Family Medicine

## 2019-08-18 ENCOUNTER — Other Ambulatory Visit: Payer: Self-pay

## 2019-08-18 VITALS — BP 130/72 | HR 58 | Temp 98.8°F | Resp 14 | Ht 65.0 in | Wt 179.0 lb

## 2019-08-18 DIAGNOSIS — R7989 Other specified abnormal findings of blood chemistry: Secondary | ICD-10-CM

## 2019-08-18 DIAGNOSIS — I10 Essential (primary) hypertension: Secondary | ICD-10-CM

## 2019-08-18 DIAGNOSIS — F330 Major depressive disorder, recurrent, mild: Secondary | ICD-10-CM

## 2019-08-18 DIAGNOSIS — Z23 Encounter for immunization: Secondary | ICD-10-CM | POA: Diagnosis not present

## 2019-08-18 DIAGNOSIS — R590 Localized enlarged lymph nodes: Secondary | ICD-10-CM

## 2019-08-18 DIAGNOSIS — K760 Fatty (change of) liver, not elsewhere classified: Secondary | ICD-10-CM | POA: Diagnosis not present

## 2019-08-18 DIAGNOSIS — F4321 Adjustment disorder with depressed mood: Secondary | ICD-10-CM

## 2019-08-18 DIAGNOSIS — E7800 Pure hypercholesterolemia, unspecified: Secondary | ICD-10-CM

## 2019-08-18 DIAGNOSIS — E079 Disorder of thyroid, unspecified: Secondary | ICD-10-CM

## 2019-08-18 LAB — CBC WITH DIFFERENTIAL/PLATELET
Absolute Monocytes: 428 cells/uL (ref 200–950)
Basophils Absolute: 41 cells/uL (ref 0–200)
Basophils Relative: 0.9 %
Eosinophils Absolute: 101 cells/uL (ref 15–500)
Eosinophils Relative: 2.2 %
HCT: 43.3 % (ref 35.0–45.0)
Hemoglobin: 14.5 g/dL (ref 11.7–15.5)
Lymphs Abs: 1831 cells/uL (ref 850–3900)
MCH: 31.3 pg (ref 27.0–33.0)
MCHC: 33.5 g/dL (ref 32.0–36.0)
MCV: 93.3 fL (ref 80.0–100.0)
MPV: 10.8 fL (ref 7.5–12.5)
Monocytes Relative: 9.3 %
Neutro Abs: 2199 cells/uL (ref 1500–7800)
Neutrophils Relative %: 47.8 %
Platelets: 206 10*3/uL (ref 140–400)
RBC: 4.64 10*6/uL (ref 3.80–5.10)
RDW: 12.9 % (ref 11.0–15.0)
Total Lymphocyte: 39.8 %
WBC: 4.6 10*3/uL (ref 3.8–10.8)

## 2019-08-18 LAB — COMPREHENSIVE METABOLIC PANEL
AG Ratio: 2.1 (calc) (ref 1.0–2.5)
ALT: 17 U/L (ref 6–29)
AST: 13 U/L (ref 10–35)
Albumin: 4.2 g/dL (ref 3.6–5.1)
Alkaline phosphatase (APISO): 47 U/L (ref 37–153)
BUN: 17 mg/dL (ref 7–25)
CO2: 24 mmol/L (ref 20–32)
Calcium: 9.4 mg/dL (ref 8.6–10.4)
Chloride: 107 mmol/L (ref 98–110)
Creat: 0.99 mg/dL (ref 0.50–0.99)
Globulin: 2 g/dL (calc) (ref 1.9–3.7)
Glucose, Bld: 98 mg/dL (ref 65–99)
Potassium: 4.4 mmol/L (ref 3.5–5.3)
Sodium: 141 mmol/L (ref 135–146)
Total Bilirubin: 0.7 mg/dL (ref 0.2–1.2)
Total Protein: 6.2 g/dL (ref 6.1–8.1)

## 2019-08-18 LAB — TEST AUTHORIZATION

## 2019-08-18 LAB — LIPID PANEL
Cholesterol: 207 mg/dL — ABNORMAL HIGH (ref <200)
HDL: 56 mg/dL (ref >=50)
LDL Cholesterol (Calc): 122 mg/dL (calc) — ABNORMAL HIGH
Non-HDL Cholesterol (Calc): 151 mg/dL (calc) — ABNORMAL HIGH (ref <130)
Total CHOL/HDL Ratio: 3.7 (calc) (ref <5.0)
Triglycerides: 177 mg/dL — ABNORMAL HIGH (ref <150)

## 2019-08-18 LAB — TSH: TSH: 5.54 mIU/L — ABNORMAL HIGH (ref 0.40–4.50)

## 2019-08-18 LAB — T3, FREE: T3, Free: 3 pg/mL (ref 2.3–4.2)

## 2019-08-18 LAB — T4, FREE: Free T4: 1.3 ng/dL (ref 0.8–1.8)

## 2019-08-18 MED ORDER — PROPRANOLOL HCL ER 80 MG PO CP24: ORAL_CAPSULE | ORAL | 3 refills | Status: DC

## 2019-08-18 MED ORDER — BUPROPION HCL ER (XL) 150 MG PO TB24: ORAL_TABLET | ORAL | 1 refills | Status: DC

## 2019-08-18 MED ORDER — BUPROPION HCL ER (XL) 300 MG PO TB24: ORAL_TABLET | ORAL | 3 refills | Status: DC

## 2019-08-18 MED ORDER — LORAZEPAM 0.5 MG PO TABS: ORAL_TABLET | ORAL | 2 refills | Status: DC

## 2019-08-18 MED ORDER — PRAVASTATIN SODIUM 40 MG PO TABS: ORAL_TABLET | ORAL | 3 refills | Status: DC

## 2019-08-18 NOTE — Assessment & Plan Note (Signed)
We will add on a free T3 and T4 in the setting of her depression fatigue is difficult to tell her thyroid is in the background as well.  She may benefit from a low dose of the Synthroid.  We will also plan ultrasound to evaluate for the lymphadenopathy in the size of thyroid.  Look at this work-up before putting her on medication

## 2019-08-18 NOTE — Patient Instructions (Addendum)
Referral to psychotherapy  Take ativan at bedtime Wellbutrin increased to 450mg  once a day  Ultrasound of thyroid/neck to be done F/U 2 weeks- telephone visit for medications  F/U 4 months in office for Physical

## 2019-08-18 NOTE — Assessment & Plan Note (Signed)
Function tests are at goal cholesterol has improved continue pravastatin

## 2019-08-18 NOTE — Progress Notes (Signed)
Subjective:    Patient ID: Debra [name], female    DOB: 1951-06-01, 68 y.o.   MRN: [id number]  Patient presents for Medication Review/ Refills (has had labs) and Sore Throat (x weeks- has had swollen lymph nodes and sore throat for most of summer)  Pt here to f/u medications and labs  For the past few months she has noticed swollen lymph nodes anteriorly occasional sore throat on and off.  She is also had fatigue but is been very stressed recently.  No current sore throat no sinus drainage pressure no cough no fever associated.  The lymph nodes however have not gone down all the way.  She does have history of thyroid disorder she had recent labs done which did show elevated TSH she has not been on medication for over 3 years.   MDD- she is on wellbutrin she also has lorazepam at home but only takes this once or twice a week.  She has been very overwhelmed with because of that as effect on her family.  She is actually lost 5 family members and friends due to the COVID virus.  She also had to put down her pets of 18 years during this time as well.  She is also now on struggling with her family because she was less money from her mother and they want her to take care of an adult nephew who has an alcohol problem which she refuses to do so.  She is not sleeping a good night she may get 3 hours of sleep.  She is tearful and crying all the time.   Hyperlipidemia- prescribed pravastatin 20mg - LDL improved  Hypertension medications viewed no chest pain or shortness of breath  Review Of Systems:  GEN- +fatigue, fever, weight loss,weakness, recent illness HEENT- denies eye drainage, change in vision, nasal discharge, CVS- denies chest pain, palpitations RESP- denies SOB, cough, wheeze ABD- denies N/V, change in stools, abd pain GU- denies dysuria, hematuria, dribbling, incontinence MSK- denies joint pain, muscle aches, injury Neuro- denies headache, dizziness, syncope, seizure activity        Objective:    BP 130/72   Pulse (!) 58   Temp 98.8 F (37.1 C) (Oral)   Resp 14   Ht 5\' 5"  (1.651 m)   Wt 179 lb (81.2 kg)   SpO2 96%   BMI 29.79 kg/m  GEN- NAD, alert and oriented x3 HEENT- PERRL, EOMI, non injected sclera, pink conjunctiva, MMM, oropharynx clear Neck- Supple, no thyromegaly, shotty ant lymph nodes above thyroid level CVS- RRR, no murmur RESP-CTAB ABD-NABS,soft,NT,ND Psych-depressed affect tearful most of visit, not anxious appearing, no SI, well groomed, good eye contact , normal speech  EXT- No edema Pulses- Radial, DP- 2+        Assessment & Plan:      Problem List Items Addressed This Visit      Unprioritized   Depression    Worsening depression with multiple factors involved.  We will increase her Wellbutrin temporarily to 450 mg once a day.  We will have her take Lorazepam at bedtime to help with the insomnia associated.  Also recommend she restart psychotherapy which she did well with in the past.      Relevant Medications   LORazepam (ATIVAN) 0.5 MG tablet   buPROPion (WELLBUTRIN XL) 300 MG 24 hr tablet   buPROPion (WELLBUTRIN XL) 150 MG 24 hr tablet   Other Relevant Orders   Ambulatory referral to Psychology   Fatty liver  Function tests are at goal cholesterol has improved continue pravastatin      Hyperlipidemia   Relevant Medications   pravastatin (PRAVACHOL) 40 MG tablet   propranolol ER (INDERAL LA) 80 MG 24 hr capsule   Hypertension - Primary    Blood pressure is controlled no change in medication.      Relevant Medications   pravastatin (PRAVACHOL) 40 MG tablet   propranolol ER (INDERAL LA) 80 MG 24 hr capsule   Thyroid disorder    We will add on a free T3 and T4 in the setting of her depression fatigue is difficult to tell her thyroid is in the background as well.  She may benefit from a low dose of the Synthroid.  We will also plan ultrasound to evaluate for the lymphadenopathy in the size of thyroid.  Look at this  work-up before putting her on medication      Relevant Medications   propranolol ER (INDERAL LA) 80 MG 24 hr capsule   Other Relevant Orders   US Soft Tissue Head/Neck    Other Visit Diagnoses    Abnormal TSH       Relevant Orders   US Soft Tissue Head/Neck   Need for immunization against influenza       Relevant Orders   Flu Vaccine QUAD High Dose(Fluad) (Completed)   Anterior cervical lymphadenopathy       Relevant Orders   US Soft Tissue Head/Neck      Note: This dictation was prepared with Dragon dictation along with smaller phrase technology. Any transcriptional errors that result from this process are unintentional.

## 2019-08-18 NOTE — Assessment & Plan Note (Signed)
Blood pressure is controlled no change in medication. 

## 2019-08-18 NOTE — Assessment & Plan Note (Signed)
Worsening depression with multiple factors involved.  We will increase her Wellbutrin temporarily to 450 mg once a day.  We will have her take Lorazepam at bedtime to help with the insomnia associated.  Also recommend she restart psychotherapy which she did well with in the past.

## 2019-08-21 ENCOUNTER — Other Ambulatory Visit: Payer: Self-pay | Admitting: Family Medicine

## 2019-08-21 DIAGNOSIS — R7989 Other specified abnormal findings of blood chemistry: Secondary | ICD-10-CM

## 2019-08-21 DIAGNOSIS — R590 Localized enlarged lymph nodes: Secondary | ICD-10-CM

## 2019-08-25 ENCOUNTER — Other Ambulatory Visit: Payer: Self-pay

## 2019-08-25 ENCOUNTER — Ambulatory Visit (HOSPITAL_COMMUNITY)
Admission: RE | Admit: 2019-08-25 | Discharge: 2019-08-25 | Disposition: A | Discharge status: Home / Self Care | Payer: Medicare Other | Source: Ambulatory Visit | Attending: Family Medicine | Admitting: Family Medicine

## 2019-08-25 DIAGNOSIS — R7989 Other specified abnormal findings of blood chemistry: Secondary | ICD-10-CM | POA: Insufficient documentation

## 2019-08-28 ENCOUNTER — Other Ambulatory Visit: Payer: Self-pay | Admitting: *Deleted

## 2019-08-28 DIAGNOSIS — E038 Other specified hypothyroidism: Secondary | ICD-10-CM

## 2019-08-28 DIAGNOSIS — E042 Nontoxic multinodular goiter: Secondary | ICD-10-CM

## 2019-08-28 DIAGNOSIS — E039 Hypothyroidism, unspecified: Secondary | ICD-10-CM

## 2019-08-30 ENCOUNTER — Encounter: Payer: Self-pay | Admitting: "Endocrinology

## 2019-08-30 ENCOUNTER — Other Ambulatory Visit: Payer: Self-pay

## 2019-08-30 ENCOUNTER — Ambulatory Visit: Payer: Medicare Other | Admitting: "Endocrinology

## 2019-08-30 VITALS — BP 142/84 | HR 53 | Temp 98.2°F | Ht 65.0 in | Wt 179.4 lb

## 2019-08-30 DIAGNOSIS — E0590 Thyrotoxicosis, unspecified without thyrotoxic crisis or storm: Secondary | ICD-10-CM | POA: Insufficient documentation

## 2019-08-30 DIAGNOSIS — E042 Nontoxic multinodular goiter: Secondary | ICD-10-CM | POA: Diagnosis not present

## 2019-08-30 NOTE — Progress Notes (Signed)
Endocrinology Consult Note                                            08/30/2019, 4:15 PM   Subjective:    Patient ID: Debra [name], female    DOB: 1951/06/07, PCP Alycia Rossetti, MD   Past Medical History:  Diagnosis Date  . Diverticulitis   . Diverticulosis of colon (without mention of hemorrhage)   . Esophageal reflux   . Generalized anxiety disorder   . Hiatal hernia   . Iron deficiency anemia 01/19/2012  . Mixed hyperlipidemia   . OA (osteoarthritis)   . PONV (postoperative nausea and vomiting)   . Unspecified hypothyroidism    Past Surgical History:  Procedure Laterality Date  . ABDOMINAL HYSTERECTOMY    . APPENDECTOMY    . BOWEL RESECTION  05/11/2012   Procedure: LOW ANTERIOR BOWEL RESECTION;  Surgeon: Jamesetta So, MD;  Location: [facility];  Service: General;;  . CESAREAN SECTION     First one 73 , second on 1984  . COLON RESECTION  05/11/2012   Procedure: HAND ASSISTED LAPAROSCOPIC COLON RESECTION;  Surgeon: Jamesetta So, MD;  Location: [facility];  Service: General;  Laterality: N/A;  Laparoscopic Hand Assisted Partial Colectomy;  . COLONOSCOPY  2011-2008   Dr. Arnoldo Morale  . COLONOSCOPY  03/10/2012   Procedure: COLONOSCOPY;  Surgeon: Rogene Houston, MD;  Location: [facility];  Service: Endoscopy;  Laterality: N/A;  200  . Hysterctomy  1988  . TONSILLECTOMY     Social History   Socioeconomic History  . Marital status: Married    Spouse name: Not on file  . Number of children: Not on file  . Years of education: Not on file  . Highest education level: Not on file  Occupational History  . Not on file  Social Needs  . Financial resource strain: Not on file  . Food insecurity    Worry: Not on file    Inability: Not on file  . Transportation needs    Medical: Not on file    Non-medical: Not on file  Tobacco Use  . Smoking status: Never Smoker  . Smokeless tobacco: Never Used  Substance and Sexual Activity  . Alcohol use: No  . Drug use: No  .  Sexual activity: Yes    Birth control/protection: Surgical  Lifestyle  . Physical activity    Days per week: Not on file    Minutes per session: Not on file  . Stress: Not on file  Relationships  . Social Herbalist on phone: Not on file    Gets together: Not on file    Attends religious service: Not on file    Active member of club or organization: Not on file    Attends meetings of clubs or organizations: Not on file    Relationship status: Not on file  Other Topics Concern  . Not on file  Social History Narrative  . Not on file   Family History  Problem Relation Age of Onset  . Rheum arthritis Mother   . Heart disease Mother   . Healthy Son   . Healthy Son   . Anesthesia problems Neg Hx   . Hypotension Neg Hx   . Malignant hyperthermia Neg Hx   . Pseudochol deficiency Neg Hx  Outpatient Encounter Medications as of 08/30/2019  Medication Sig  . buPROPion (WELLBUTRIN XL) 300 MG 24 hr tablet TAKE  450mg   BY MOUTH ONCE DAILY.  Marland Kitchen dicyclomine (BENTYL) 20 MG tablet Take 20 mg by mouth every 6 (six) hours as needed for spasms.  Marland Kitchen LORazepam (ATIVAN) 0.5 MG tablet TAKE 1 TABLET TWICE DAILY AS NEEDED FOR AGITATION AND/OR ANXIETY.  . Multiple Vitamin (MULITIVITAMIN WITH MINERALS) TABS Take 1 tablet by mouth daily.  . pravastatin (PRAVACHOL) 40 MG tablet TAKE (1/2) TABLET BY MOUTH ONCE DAILY.  Marland Kitchen propranolol ER (INDERAL LA) 80 MG 24 hr capsule TAKE (1) CAPSULE BY MOUTH AT BEDTIME.  . vitamin C (ASCORBIC ACID) 500 MG tablet Take 500 mg by mouth daily.  . [DISCONTINUED] buPROPion (WELLBUTRIN XL) 150 MG 24 hr tablet Take 450mg  once a day ( 150mg  with  300mg  tablet) (Patient not taking: Reported on 08/30/2019)  . [DISCONTINUED] fluticasone (FLONASE) 50 MCG/ACT nasal spray Place 2 sprays into both nostrils daily. (Patient not taking: Reported on 08/30/2019)   No facility-administered encounter medications on file as of 08/30/2019.    ALLERGIES: Allergies  Allergen Reactions  .  Codeine Nausea And Vomiting  . Morphine And Related Nausea And Vomiting  . Sulfa Antibiotics Hives and Itching    VACCINATION STATUS: Immunization History  Administered Date(s) Administered  . Fluad Quad(high Dose 65+) 08/18/2019  . Influenza Split 10/21/2013  . Influenza, High Dose Seasonal PF 09/20/2018  . Influenza,inj,Quad PF,6+ Mos 08/19/2015  . Influenza-Unspecified 09/12/2007  . Pneumococcal Conjugate-13 08/19/2015  . Pneumococcal Polysaccharide-23 02/09/2014  . Tdap 05/07/2013  . Zoster 07/08/2013    HPI Debra [name] is 68 y.o. female who presents today with a medical history as above. she is being seen in consultation for multinodular goiter and subclinical hypothyroidism requested by Alycia Rossetti, MD. -History is obtained directly from the patient.  She denies any prior history of thyroid dysfunction.  She reports various forms of thyroid dysfunction in her father members including her mother and 2 aunts.  She denies any family history of thyroid malignancy.  She was recently found to have slightly above target TSH of 5.54, along with normal levels of free T4 and free T3.  She is currently not on any antithyroid medications nor thyroid hormone supplements.  August 25, 2019 thyroid ultrasound reveals multiple nodules in bilateral thyroid lobes, largest one 1.2 cm in the left lobe, nonsuspicious. -She denies any dysphagia, shortness of breath, nor voice change.  She denies tremors, heat intolerance, palpitations.  Review of Systems  Constitutional: no recent weight gain/loss, no fatigue, no subjective hyperthermia, no subjective hypothermia Eyes: no blurry vision, no xerophthalmia ENT: no sore throat, no nodules palpated in throat, no dysphagia/odynophagia, no hoarseness Cardiovascular: no Chest Pain, no Shortness of Breath, no palpitations, no leg swelling Respiratory: no cough, no shortness of breath Gastrointestinal: no Nausea/Vomiting/Diarhhea Musculoskeletal:  no muscle/joint aches Skin: no rashes Neurological: no tremors, no numbness, no tingling, no dizziness Psychiatric: no depression, no anxiety  Objective:    BP (!) 142/84 (BP Location: Right Arm, Patient Position: Sitting, Cuff Size: Normal)   Pulse (!) 53   Temp 98.2 F (36.8 C) (Oral)   Ht 5\' 5"  (1.651 m)   Wt 179 lb 6.4 oz (81.4 kg)   SpO2 96%   BMI 29.85 kg/m   Wt Readings from Last 3 Encounters:  08/30/19 179 lb 6.4 oz (81.4 kg)  08/18/19 179 lb (81.2 kg)  02/24/19 174 lb 12.8 oz (79.3 kg)  Physical Exam  Constitutional:  Body mass index is 29.85 kg/m.,  not in acute distress, normal state of mind Eyes: PERRLA, EOMI, no exophthalmos ENT: moist mucous membranes, + palpable mild thyromegaly, no gross cervical lymphadenopathy Cardiovascular: normal precordial activity, Regular Rate and Rhythm, no Murmur/Rubs/Gallops Respiratory:  adequate breathing efforts, no gross chest deformity, Clear to auscultation bilaterally Gastrointestinal: abdomen soft, Non -tender, No distension, Bowel Sounds present, no gross organomegaly Musculoskeletal: no gross deformities, strength intact in all four extremities Skin: moist, warm, no rashes Neurological: no tremor with outstretched hands, Deep tendon reflexes normal in bilateral lower extremities.  CMP ( most recent) CMP     Component Value Date/Time   NA 141 08/16/2019 0846   K 4.4 08/16/2019 0846   CL 107 08/16/2019 0846   CO2 24 08/16/2019 0846   GLUCOSE 98 08/16/2019 0846   BUN 17 08/16/2019 0846   CREATININE 0.99 08/16/2019 0846   CALCIUM 9.4 08/16/2019 0846   PROT 6.2 08/16/2019 0846   ALBUMIN 4.3 07/27/2017 0801   ALBUMIN 4.3 07/27/2017 0801   AST 13 08/16/2019 0846   ALT 17 08/16/2019 0846   ALKPHOS 54 07/27/2017 0801   ALKPHOS 54 07/27/2017 0801   BILITOT 0.7 08/16/2019 0846   GFRNONAA 56 (L) 07/27/2017 0801   GFRAA 65 07/27/2017 0801    Lipid Panel     Component Value Date/Time   CHOL 207 (H) 08/16/2019 0846    TRIG 177 (H) 08/16/2019 0846   HDL 56 08/16/2019 0846   CHOLHDL 3.7 08/16/2019 0846   VLDL 27 07/27/2017 0801   LDLCALC 122 (H) 08/16/2019 0846      Lab Results  Component Value Date   TSH 5.54 (H) 08/16/2019   TSH 3.97 10/02/2016   TSH 4.06 01/20/2016   TSH 1.281 11/26/2014   TSH 2.616 12/06/2013   FREET4 1.3 08/16/2019   FREET4 1.1 10/02/2016   FREET4 1.3 01/20/2016  August 25, 2019 thyroid ultrasound IMPRESSION: 1. Findings suggestive of multinodular goiter. 2. Nodule #4 which is 1.2 cm meets imaging criteria to recommend a 1 year follow-up as clinically indicated.   Assessment & Plan:   1. Multinodular goiter 2. Subclinical hyperthyroidism - Debra [name]  is being seen at a kind request of [place], Modena Nunnery, MD. - I have reviewed her available thyroid records and clinically evaluated the patient. - Based on these reviews, she has multinodular goiter with slightly above target TSH. -She is not clinically symptomatic of mass-effect nor thyroid dysfunction.  She would not require intervention with medications or surgery or biopsy at this time.  -I had a long discussion with her regarding the need for follow-up thyroid ultrasound and repeat thyroid function tests in 1 year. - I did not initiate any new prescriptions today. - I advised her  to maintain close follow up with Alycia Rossetti, MD for primary care needs.   - Time spent with the patient: 45 minutes, of which >50% was spent in obtaining information about her symptoms, reviewing her previous labs/studies,  evaluations, and treatments, counseling her about her nontoxic multinodular goiter, and developing a plan to confirm the diagnosis and long term treatment based on the latest standards of care/guidelines.    Debra [name] participated in the discussions, expressed understanding, and voiced agreement with the above plans.  All questions were answered to her satisfaction. she is encouraged to contact  clinic should she have any questions or concerns prior to her return visit.  Follow up plan: Return in  about 1 year (around 08/29/2020) for Follow up with Pre-visit Labs, Thyroid / Neck Ultrasound.   Glade Lloyd, MD [facility] [address] [place], [place] [address] Phone: [phone]  Fax: [phone]     08/30/2019, 4:15 PM  This note was partially dictated with voice recognition software. Similar sounding words can be transcribed inadequately or may not  be corrected upon review.

## 2019-08-31 ENCOUNTER — Other Ambulatory Visit: Payer: Self-pay

## 2019-09-01 ENCOUNTER — Encounter: Payer: Self-pay | Admitting: Family Medicine

## 2019-09-01 ENCOUNTER — Ambulatory Visit (INDEPENDENT_AMBULATORY_CARE_PROVIDER_SITE_OTHER): Payer: Medicare Other | Admitting: Family Medicine

## 2019-09-01 DIAGNOSIS — F330 Major depressive disorder, recurrent, mild: Secondary | ICD-10-CM | POA: Diagnosis not present

## 2019-09-01 DIAGNOSIS — F4321 Adjustment disorder with depressed mood: Secondary | ICD-10-CM

## 2019-09-01 DIAGNOSIS — F4320 Adjustment disorder, unspecified: Secondary | ICD-10-CM

## 2019-09-01 MED ORDER — BUPROPION HCL ER (XL) 150 MG PO TB24: ORAL_TABLET | ORAL | Status: DC

## 2019-09-01 NOTE — Progress Notes (Signed)
Virtual Visit via Telephone Note  I connected with Debra [name] on 09/01/19 at 8:02am by telephone and verified that I am speaking with the correct person using two identifiers.      Pt location: at home   Physician location:  In office, [facility], Vic Blackbird MD     On call: patient and physician   I discussed the limitations, risks, security and privacy concerns of performing an evaluation and management service by telephone and the availability of in person appointments. I also discussed with the patient that there may be a patient responsible charge related to this service. The patient expressed understanding and agreed to proceed.   History of Present Illness:   Telephone to follow-up medication change from her last visit on the 11th.  Her Wellbutrin was increased to 450 mg once a day for her depression and grief.  She feels like this has helped she takes it most days the additional dosing.  She also uses her ativan  to help her sleep which has been working.  She did reach out for psychotherapy but has some difficulty with that particular agency so she would like to be referred to a different one.  She has been trying to walk daily which is help with stress relief as well.  She feels like she is in a better place.  Of note she was also seen by endocrinology for her goiter and abnormal TSH decision was made to just monitor at this time and not start her on any medications. No new concerns    Observations/Objective: NAD noted on phone, normal speech, thought process   Assessment and Plan: MDD/Grief reaction- Continue Wellbutrin 450mg  once a day, ativan for sleep as needed  referral to new therapist  Pt to call for any concerns  Keep f/u we have in 3 months   Follow Up Instructions:    I discussed the assessment and treatment plan with the patient. The patient was provided an opportunity to ask questions and all were answered. The patient agreed with the plan and  demonstrated an understanding of the instructions.   The patient was advised to call back or seek an in-person evaluation if the symptoms worsen or if the condition fails to improve as anticipated.  I provided  minutes of non-face-to-face time during this encounter. End time 8:08am   Vic Blackbird, MD

## 2019-09-18 ENCOUNTER — Ambulatory Visit: Payer: Medicare Other | Admitting: "Endocrinology

## 2019-10-13 ENCOUNTER — Encounter: Payer: Self-pay | Admitting: Family Medicine

## 2019-11-23 ENCOUNTER — Other Ambulatory Visit (HOSPITAL_COMMUNITY): Payer: Self-pay | Admitting: Family Medicine

## 2019-11-23 DIAGNOSIS — Z1231 Encounter for screening mammogram for malignant neoplasm of breast: Secondary | ICD-10-CM

## 2019-12-07 ENCOUNTER — Other Ambulatory Visit: Payer: Self-pay | Admitting: Family Medicine

## 2019-12-25 ENCOUNTER — Encounter: Payer: Medicare Other | Admitting: Family Medicine

## 2020-01-01 ENCOUNTER — Ambulatory Visit (HOSPITAL_COMMUNITY): Payer: Medicare Other

## 2020-01-07 ENCOUNTER — Ambulatory Visit: Payer: Medicare Other

## 2020-01-12 ENCOUNTER — Ambulatory Visit: Payer: Medicare PPO | Attending: Internal Medicine

## 2020-01-12 DIAGNOSIS — Z23 Encounter for immunization: Secondary | ICD-10-CM

## 2020-01-12 NOTE — Progress Notes (Signed)
   Covid-19 Vaccination Clinic  Name:  Debra [name]    MRN: [id number] DOB: [birth year]  01/12/2020  [name] was observed post Covid-19 immunization for 15 minutes without incidence. She was provided with Vaccine Information Sheet and instruction to access the V-Safe system.   [name] was instructed to call 911 with any severe reactions post vaccine: Marland Kitchen Difficulty breathing  . Swelling of your face and throat  . A fast heartbeat  . A bad rash all over your body  . Dizziness and weakness    Immunizations Administered    Name Date Dose VIS Date Route   Pfizer COVID-19 Vaccine 01/12/2020  4:56 PM 0.3 mL 11/17/2019 Intramuscular   Manufacturer: [place]   Lot: [id number]   [place]: [id number]

## 2020-01-18 ENCOUNTER — Ambulatory Visit: Payer: Medicare Other

## 2020-01-29 ENCOUNTER — Ambulatory Visit: Payer: Medicare Other | Admitting: Family Medicine

## 2020-02-06 ENCOUNTER — Ambulatory Visit: Payer: Medicare PPO | Attending: Internal Medicine

## 2020-02-06 DIAGNOSIS — Z23 Encounter for immunization: Secondary | ICD-10-CM | POA: Insufficient documentation

## 2020-02-06 NOTE — Progress Notes (Signed)
   Covid-19 Vaccination Clinic  Name:  Debra [name]    MRN: [id number] DOB: 1951/06/21  02/06/2020  [name] was observed post Covid-19 immunization for 15 minutes without incident. She was provided with Vaccine Information Sheet and instruction to access the V-Safe system.   [name] was instructed to call 911 with any severe reactions post vaccine: Marland Kitchen Difficulty breathing  . Swelling of face and throat  . A fast heartbeat  . A bad rash all over body  . Dizziness and weakness   Immunizations Administered    Name Date Dose VIS Date Route   Pfizer COVID-19 Vaccine 02/06/2020  4:15 PM 0.3 mL 11/17/2019 Intramuscular   Manufacturer: [place]   Lot: [id number]   [place]: [id number]

## 2020-02-16 ENCOUNTER — Other Ambulatory Visit: Payer: Self-pay

## 2020-02-16 ENCOUNTER — Other Ambulatory Visit: Payer: Medicare PPO

## 2020-02-16 DIAGNOSIS — D509 Iron deficiency anemia, unspecified: Secondary | ICD-10-CM

## 2020-02-16 DIAGNOSIS — I10 Essential (primary) hypertension: Secondary | ICD-10-CM | POA: Diagnosis not present

## 2020-02-16 DIAGNOSIS — E7800 Pure hypercholesterolemia, unspecified: Secondary | ICD-10-CM

## 2020-02-17 LAB — CBC WITH DIFFERENTIAL/PLATELET
Absolute Monocytes: 442 cells/uL (ref 200–950)
Basophils Absolute: 48 cells/uL (ref 0–200)
Basophils Relative: 1 %
Eosinophils Absolute: 120 cells/uL (ref 15–500)
Eosinophils Relative: 2.5 %
HCT: 43.4 % (ref 35.0–45.0)
Hemoglobin: 14.8 g/dL (ref 11.7–15.5)
Lymphs Abs: 1709 cells/uL (ref 850–3900)
MCH: 31.8 pg (ref 27.0–33.0)
MCHC: 34.1 g/dL (ref 32.0–36.0)
MCV: 93.3 fL (ref 80.0–100.0)
MPV: 10.8 fL (ref 7.5–12.5)
Monocytes Relative: 9.2 %
Neutro Abs: 2482 cells/uL (ref 1500–7800)
Neutrophils Relative %: 51.7 %
Platelets: 218 10*3/uL (ref 140–400)
RBC: 4.65 10*6/uL (ref 3.80–5.10)
RDW: 12.7 % (ref 11.0–15.0)
Total Lymphocyte: 35.6 %
WBC: 4.8 10*3/uL (ref 3.8–10.8)

## 2020-02-17 LAB — LIPID PANEL
Cholesterol: 189 mg/dL (ref <200)
HDL: 52 mg/dL (ref >=50)
LDL Cholesterol (Calc): 111 mg/dL (calc) — ABNORMAL HIGH
Non-HDL Cholesterol (Calc): 137 mg/dL (calc) — ABNORMAL HIGH (ref <130)
Total CHOL/HDL Ratio: 3.6 (calc) (ref <5.0)
Triglycerides: 138 mg/dL (ref <150)

## 2020-02-17 LAB — COMPREHENSIVE METABOLIC PANEL
AG Ratio: 1.7 (calc) (ref 1.0–2.5)
ALT: 14 U/L (ref 6–29)
AST: 14 U/L (ref 10–35)
Albumin: 4 g/dL (ref 3.6–5.1)
Alkaline phosphatase (APISO): 52 U/L (ref 37–153)
BUN/Creatinine Ratio: 19 (calc) (ref 6–22)
BUN: 20 mg/dL (ref 7–25)
CO2: 24 mmol/L (ref 20–32)
Calcium: 9.2 mg/dL (ref 8.6–10.4)
Chloride: 105 mmol/L (ref 98–110)
Creat: 1.03 mg/dL — ABNORMAL HIGH (ref 0.50–0.99)
Globulin: 2.4 g/dL (calc) (ref 1.9–3.7)
Glucose, Bld: 98 mg/dL (ref 65–99)
Potassium: 4.1 mmol/L (ref 3.5–5.3)
Sodium: 138 mmol/L (ref 135–146)
Total Bilirubin: 0.6 mg/dL (ref 0.2–1.2)
Total Protein: 6.4 g/dL (ref 6.1–8.1)

## 2020-02-26 ENCOUNTER — Ambulatory Visit: Payer: Self-pay | Admitting: Family Medicine

## 2020-02-26 ENCOUNTER — Other Ambulatory Visit: Payer: Self-pay

## 2020-02-26 ENCOUNTER — Ambulatory Visit (HOSPITAL_COMMUNITY)
Admission: RE | Admit: 2020-02-26 | Discharge: 2020-02-26 | Disposition: A | Discharge status: Home / Self Care | Payer: Medicare PPO | Source: Ambulatory Visit | Attending: Family Medicine | Admitting: Family Medicine

## 2020-02-26 DIAGNOSIS — Z1231 Encounter for screening mammogram for malignant neoplasm of breast: Secondary | ICD-10-CM | POA: Diagnosis not present

## 2020-03-01 ENCOUNTER — Other Ambulatory Visit: Payer: Self-pay

## 2020-03-01 ENCOUNTER — Ambulatory Visit (INDEPENDENT_AMBULATORY_CARE_PROVIDER_SITE_OTHER): Payer: Medicare PPO | Admitting: Family Medicine

## 2020-03-01 ENCOUNTER — Encounter: Payer: Self-pay | Admitting: Family Medicine

## 2020-03-01 DIAGNOSIS — K760 Fatty (change of) liver, not elsewhere classified: Secondary | ICD-10-CM | POA: Diagnosis not present

## 2020-03-01 DIAGNOSIS — D509 Iron deficiency anemia, unspecified: Secondary | ICD-10-CM

## 2020-03-01 DIAGNOSIS — I10 Essential (primary) hypertension: Secondary | ICD-10-CM

## 2020-03-01 DIAGNOSIS — K580 Irritable bowel syndrome with diarrhea: Secondary | ICD-10-CM

## 2020-03-01 DIAGNOSIS — F330 Major depressive disorder, recurrent, mild: Secondary | ICD-10-CM | POA: Diagnosis not present

## 2020-03-01 DIAGNOSIS — E7800 Pure hypercholesterolemia, unspecified: Secondary | ICD-10-CM

## 2020-03-01 NOTE — Assessment & Plan Note (Signed)
Prn bentyl , has been fairly well controlled

## 2020-03-01 NOTE — Progress Notes (Signed)
Subjective:    Patient ID: Debra [name], female    DOB: 1951-02-06, 69 y.o.   MRN: [id number]  Patient presents for Follow-up (is not fasting)  Pt here to f/u chronic medical problems  At her visit in Sept wellbutrin was increased to  450mg  once a day, she was also taking lorazepam as needed for sleep.  She has significant stressors in her life at that time along with multiple friends and family members that have passed away from [place].  She was also dealing with some financial issues with some family members that were trying to get money out of her which was quite stressful.  She did try to make contact with psychotherapist multiple times but was unable to do so.  Instead she started reading doing some self reflection yoga which is helped.  She is now weaned herself down to 300 mg of Wellbutrin daily and occasional lorazepam when she cannot sleep.  She did try melatonin but this gave her nightmares.  Most nights she sleeps quite well but she does wake up in the middle the night she has found herself snacking some so she has been trying to cut this out. In general she feels like she is in a better place mentally.  Her husband has also been of significant help  HTN- taking inderal 80mg  once a day   Hyperlipidemia- taking pravastatin daily , recent lipids improved   Recent fasting labs reviewed at bedside   Taking iron   Followed by endocrine for her goiter, f/u in 1 year   COVID-19 vaccine done  Review Of Systems:  GEN- denies fatigue, fever, weight loss,weakness, recent illness HEENT- denies eye drainage, change in vision, nasal discharge, CVS- denies chest pain, palpitations RESP- denies SOB, cough, wheeze ABD- denies N/V, change in stools, abd pain GU- denies dysuria, hematuria, dribbling, incontinence MSK- denies joint pain, muscle aches, injury Neuro- denies headache, dizziness, syncope, seizure activity       Objective:    BP 126/68   Pulse (!) 52   Temp 98.2 F (36.8  C) (Temporal)   Resp 14   Ht 5\' 5"  (1.651 m)   Wt 185 lb (83.9 kg)   SpO2 98%   BMI 30.79 kg/m  GEN- NAD, alert and oriented x3 HEENT- PERRL, EOMI, non injected sclera, pink conjunctiva, MMM, oropharynx clear Neck- Supple, no thyromegaly CVS- RRR, no murmur RESP-CTAB Psych normal affect and mood EXT- No edema Pulses- Radial, DP- 2+        Assessment & Plan:      Problem List Items Addressed This Visit      Unprioritized   Depression    She feels like she is in a better state.  She will continue the Wellbutrin 300 mg lorazepam as needed.  She also recently bought a small for her home when which she can exercise and swim she thinks this will be good for stress reduction.  She is also now yoga 4 days a week and she is reading for fun.  He does fall asleep without any difficulty but still wakes up in the melanite.  We discussed trying chamomile tea or there beverage to  try to avoid snacking in the middle the night      Fatty liver    LFT normal, cholesterol much improved       Hyperlipidemia    Doing well on statin drug no changes  We did discuss options to prevent eating in middle of the night  Hypertension    Controlled no changes       IBS (irritable bowel syndrome)    Prn bentyl , has been fairly well controlled      Iron deficiency anemia    Hb at goal, take iron with vitamin C       Relevant Medications   ferrous sulfate 325 (65 FE) MG tablet      Note: This dictation was prepared with Dragon dictation along with smaller phrase technology. Any transcriptional errors that result from this process are unintentional.

## 2020-03-01 NOTE — Assessment & Plan Note (Signed)
She feels like she is in a better state.  She will continue the Wellbutrin 300 mg lorazepam as needed.  She also recently bought a small for her home when which she can exercise and swim she thinks this will be good for stress reduction.  She is also now yoga 4 days a week and she is reading for fun.  He does fall asleep without any difficulty but still wakes up in the melanite.  We discussed trying chamomile tea or there beverage to  try to avoid snacking in the middle the night

## 2020-03-01 NOTE — Assessment & Plan Note (Signed)
Doing well on statin drug no changes  We did discuss options to prevent eating in middle of the night

## 2020-03-01 NOTE — Assessment & Plan Note (Signed)
LFT normal, cholesterol much improved

## 2020-03-01 NOTE — Assessment & Plan Note (Signed)
Controlled no changes 

## 2020-03-01 NOTE — Patient Instructions (Addendum)
F/U 6 months Physical  

## 2020-03-01 NOTE — Assessment & Plan Note (Signed)
Hb at goal, take iron with vitamin C

## 2020-04-01 ENCOUNTER — Other Ambulatory Visit: Payer: Self-pay | Admitting: Family Medicine

## 2020-04-01 ENCOUNTER — Ambulatory Visit (HOSPITAL_COMMUNITY): Payer: Medicare PPO

## 2020-04-02 NOTE — Telephone Encounter (Signed)
Ok to refill??  Last office visit/ refill 08/18/2019, #2 refills.

## 2020-05-02 DIAGNOSIS — H35033 Hypertensive retinopathy, bilateral: Secondary | ICD-10-CM | POA: Diagnosis not present

## 2020-05-03 ENCOUNTER — Ambulatory Visit: Payer: Medicare PPO | Attending: Internal Medicine

## 2020-05-03 DIAGNOSIS — Z20822 Contact with and (suspected) exposure to covid-19: Secondary | ICD-10-CM

## 2020-05-04 LAB — SARS-COV-2, NAA 2 DAY TAT

## 2020-05-04 LAB — NOVEL CORONAVIRUS, NAA: SARS-CoV-2, NAA: NOT DETECTED

## 2020-05-15 ENCOUNTER — Telehealth: Payer: Self-pay | Admitting: Family Medicine

## 2020-05-15 MED ORDER — AMLODIPINE BESYLATE 5 MG PO TABS: 5.0000 mg | ORAL_TABLET | Freq: Every day | ORAL | 0 refills | Status: DC

## 2020-05-15 NOTE — Telephone Encounter (Signed)
Spoke with patient and informed her that we are sending in norvasc 5mg . Advised of warning symptoms for cardiac events and when to seek medical care. Patient verbalized understanding.

## 2020-05-15 NOTE — Telephone Encounter (Signed)
Patient called in with c/o elevated BP. Patient states that this morning her BP was 160/90, last night when she checked it it was 180/106. She checked it with two different cuffs to verify accuracy of her cuff and got same readings. She is currently asymptomatic but has concerns of elevated bp. Please advise?

## 2020-05-15 NOTE — Telephone Encounter (Signed)
Send over novasc 5mg  once a day Schedule for OV next week If any chest pain, sob with the elevated BP go to ER as this could be cardiac event

## 2020-05-15 NOTE — Telephone Encounter (Signed)
error 

## 2020-05-16 ENCOUNTER — Ambulatory Visit: Payer: Self-pay | Admitting: Nurse Practitioner

## 2020-05-20 ENCOUNTER — Ambulatory Visit: Payer: Medicare PPO | Admitting: Family Medicine

## 2020-05-21 ENCOUNTER — Other Ambulatory Visit: Payer: Self-pay

## 2020-05-21 ENCOUNTER — Ambulatory Visit: Payer: Medicare PPO | Admitting: Family Medicine

## 2020-05-21 ENCOUNTER — Encounter: Payer: Self-pay | Admitting: Family Medicine

## 2020-05-21 VITALS — BP 138/72 | HR 80 | Temp 98.2°F | Resp 14 | Wt 182.0 lb

## 2020-05-21 DIAGNOSIS — I10 Essential (primary) hypertension: Secondary | ICD-10-CM

## 2020-05-21 DIAGNOSIS — E042 Nontoxic multinodular goiter: Secondary | ICD-10-CM

## 2020-05-21 DIAGNOSIS — E0590 Thyrotoxicosis, unspecified without thyrotoxic crisis or storm: Secondary | ICD-10-CM | POA: Diagnosis not present

## 2020-05-21 NOTE — Patient Instructions (Addendum)
Keep taking norvasc 5mg  in the morning Record your blood pressure daily We will call with lab results Check your Heart Rate  F/U as previous

## 2020-05-21 NOTE — Progress Notes (Signed)
   Subjective:    Patient ID: Debra [name], female    DOB: 16-Sep-1951, 69 y.o.   MRN: [id number]  Patient presents for Hypertension  Patient here to follow-up on elevated blood pressure readings.  She called in Tonight stating that her blood pressure was elevated.  Her systolic was running from 370W to 888B diastolic 16X to low 450T.  She had some increased headaches over the past few weeks but otherwise no other symptoms.  She typically follows a low-salt diet.  She been recently on vacation and felt good.  She also exercises a few times a week.  She has not had any chest pain shortness of breath or peripheral edema.  I started her on amlodipine 5 mg which she has been taking for the past few days her blood pressures have come down.  Now they range 120s to 140s over 80s to 90s.  Not had any side effects with the medication itself.   Review Of Systems:  GEN- denies fatigue, fever, weight loss,weakness, recent illness HEENT- denies eye drainage, change in vision, nasal discharge, CVS- denies chest pain, palpitations RESP- denies SOB, cough, wheeze ABD- denies N/V, change in stools, abd pain GU- denies dysuria, hematuria, dribbling, incontinence MSK- denies joint pain, muscle aches, injury Neuro- denies headache, dizziness, syncope, seizure activity       Objective:    BP 138/72   Pulse 80   Temp 98.2 F (36.8 C)   Resp 14   Wt 182 lb (82.6 kg)   SpO2 96%   BMI 30.29 kg/m  GEN- NAD, alert and oriented x3 HEENT- PERRL, EOMI, non injected sclera, pink conjunctiva, MMM, oropharynx clear Neck- Supple, no thyromegaly CVS- RRR, no murmur RESP-CTAB ABD-NABS,soft,NT,ND EXT- No edema Pulses- Radial, DP- 2+  EKG- Sinus rhythem, HR  48 ( beta blocker)      Assessment & Plan:      Problem List Items Addressed This Visit      Unprioritized   Hypertension - Primary    Unclear cause of elevated blood pressure in the past couple weeks.  Is improved on amlodipine 5 mg in addition  to the Inderal.  She has had bradycardia at baseline setting of the beta-blocker for many years now she is not symptomatic from this.  At this point I will check her labs as she does have multinodular goiter with subclinical hyperthyroidism to make sure this is not contributing to her elevated blood pressure.  If her thyroid labs come back abnormal I will contact her endocrinologist who has been following.  Also check her metabolic panel  Continue to monitor her blood pressure along with her heart rate at home.      Relevant Orders   CBC with Differential/Platelet   Comprehensive metabolic panel   EKG 88-EKCM (Completed)   Multinodular goiter   Subclinical hyperthyroidism   Relevant Orders   TSH   T3, free   T4, free   Thyroid Peroxidase Antibody   Thyroglobulin antibody      Note: This dictation was prepared with Dragon dictation along with smaller phrase technology. Any transcriptional errors that result from this process are unintentional.

## 2020-05-21 NOTE — Assessment & Plan Note (Signed)
Unclear cause of elevated blood pressure in the past couple weeks.  Is improved on amlodipine 5 mg in addition to the Inderal.  She has had bradycardia at baseline setting of the beta-blocker for many years now she is not symptomatic from this.  At this point I will check her labs as she does have multinodular goiter with subclinical hyperthyroidism to make sure this is not contributing to her elevated blood pressure.  If her thyroid labs come back abnormal I will contact her endocrinologist who has been following.  Also check her metabolic panel  Continue to monitor her blood pressure along with her heart rate at home.

## 2020-05-22 LAB — CBC WITH DIFFERENTIAL/PLATELET
Absolute Monocytes: 561 cells/uL (ref 200–950)
Basophils Absolute: 38 cells/uL (ref 0–200)
Basophils Relative: 0.6 %
Eosinophils Absolute: 82 cells/uL (ref 15–500)
Eosinophils Relative: 1.3 %
HCT: 43.3 % (ref 35.0–45.0)
Hemoglobin: 14.6 g/dL (ref 11.7–15.5)
Lymphs Abs: 2010 cells/uL (ref 850–3900)
MCH: 31.4 pg (ref 27.0–33.0)
MCHC: 33.7 g/dL (ref 32.0–36.0)
MCV: 93.1 fL (ref 80.0–100.0)
MPV: 10.8 fL (ref 7.5–12.5)
Monocytes Relative: 8.9 %
Neutro Abs: 3610 cells/uL (ref 1500–7800)
Neutrophils Relative %: 57.3 %
Platelets: 219 10*3/uL (ref 140–400)
RBC: 4.65 10*6/uL (ref 3.80–5.10)
RDW: 13 % (ref 11.0–15.0)
Total Lymphocyte: 31.9 %
WBC: 6.3 10*3/uL (ref 3.8–10.8)

## 2020-05-22 LAB — THYROID PEROXIDASE ANTIBODY: Thyroperoxidase Ab SerPl-aCnc: <1 IU/mL (ref <9)

## 2020-05-22 LAB — COMPREHENSIVE METABOLIC PANEL
AG Ratio: 1.8 (calc) (ref 1.0–2.5)
ALT: 18 U/L (ref 6–29)
AST: 13 U/L (ref 10–35)
Albumin: 4.2 g/dL (ref 3.6–5.1)
Alkaline phosphatase (APISO): 53 U/L (ref 37–153)
BUN/Creatinine Ratio: 19 (calc) (ref 6–22)
BUN: 22 mg/dL (ref 7–25)
CO2: 23 mmol/L (ref 20–32)
Calcium: 9.4 mg/dL (ref 8.6–10.4)
Chloride: 106 mmol/L (ref 98–110)
Creat: 1.13 mg/dL — ABNORMAL HIGH (ref 0.50–0.99)
Globulin: 2.4 g/dL (calc) (ref 1.9–3.7)
Glucose, Bld: 92 mg/dL (ref 65–99)
Potassium: 4.4 mmol/L (ref 3.5–5.3)
Sodium: 138 mmol/L (ref 135–146)
Total Bilirubin: 0.5 mg/dL (ref 0.2–1.2)
Total Protein: 6.6 g/dL (ref 6.1–8.1)

## 2020-05-22 LAB — T3, FREE: T3, Free: 3.4 pg/mL (ref 2.3–4.2)

## 2020-05-22 LAB — TSH: TSH: 3.73 mIU/L (ref 0.40–4.50)

## 2020-05-22 LAB — THYROGLOBULIN ANTIBODY: Thyroglobulin Ab: <1 IU/mL (ref <=1)

## 2020-05-22 LAB — T4, FREE: Free T4: 1.2 ng/dL (ref 0.8–1.8)

## 2020-05-23 ENCOUNTER — Telehealth: Payer: Self-pay | Admitting: *Deleted

## 2020-05-23 ENCOUNTER — Other Ambulatory Visit: Payer: Self-pay | Admitting: *Deleted

## 2020-05-23 DIAGNOSIS — N179 Acute kidney failure, unspecified: Secondary | ICD-10-CM

## 2020-05-23 NOTE — Telephone Encounter (Signed)
Received call from patient.   Reports that she is having sinus congestion and nasal drainage with green colored mucus. States that she is having sincus pressure on her frontal sinuses and her teeth.   Reports that Sx have worsened since being in office. States that she has been using OTC meds without relief.  Requested ABTx. MD please advise.

## 2020-05-24 MED ORDER — AMOXICILLIN 875 MG PO TABS
875.0000 mg | ORAL_TABLET | Freq: Two times a day (BID) | ORAL | 0 refills | Status: DC
Start: 2020-05-24 — End: 2020-07-06

## 2020-05-24 NOTE — Telephone Encounter (Signed)
Prescription sent to pharmacy. .   Call placed to patient and patient made aware.  

## 2020-05-24 NOTE — Telephone Encounter (Signed)
Send amoxicillin 875mg  BID x 7 days Add nasal saline Oral anti-histamine Avoid decongestants such as Sudafed due to blood pressure

## 2020-07-06 ENCOUNTER — Ambulatory Visit
Admission: EM | Admit: 2020-07-06 | Discharge: 2020-07-06 | Disposition: A | Discharge status: Home / Self Care | Payer: Medicare PPO | Attending: Physician Assistant | Admitting: Physician Assistant

## 2020-07-06 ENCOUNTER — Other Ambulatory Visit: Payer: Self-pay

## 2020-07-06 ENCOUNTER — Encounter: Payer: Self-pay | Admitting: Emergency Medicine

## 2020-07-06 DIAGNOSIS — J069 Acute upper respiratory infection, unspecified: Secondary | ICD-10-CM | POA: Diagnosis not present

## 2020-07-06 DIAGNOSIS — B349 Viral infection, unspecified: Secondary | ICD-10-CM

## 2020-07-06 MED ORDER — AMOXICILLIN 875 MG PO TABS: 875.0000 mg | ORAL_TABLET | Freq: Two times a day (BID) | ORAL | 0 refills | Status: DC

## 2020-07-06 NOTE — Discharge Instructions (Addendum)
Most likely viral. Antibiotics do not prevent a bacterial infection. So if symptoms continue out past a week then ok to take the antibiotics. Stay hydrated, and continue with nasal routine. Hope you feel better.

## 2020-07-06 NOTE — ED Triage Notes (Signed)
Sinus congestion and ear pressure x 24 hours. Pt states she is prone to sinus infections.

## 2020-07-06 NOTE — ED Provider Notes (Signed)
[facility]    CSN: [id number] Arrival date & time: 07/06/20  1017      History   Chief Complaint Chief Complaint  Patient presents with  . Ear Fullness    HPI Debra [name] is a 69 y.o. female.   Who presents with a 24 history of nasal congestion and ear fullness. No fevers or chills. States that she generally gets an antibiotic for this. She has no cough or congestion.      Past Medical History:  Diagnosis Date  . Diverticulitis   . Diverticulosis of colon (without mention of hemorrhage)   . Esophageal reflux   . Generalized anxiety disorder   . Hiatal hernia   . Hypertension   . Iron deficiency anemia 01/19/2012  . Mixed hyperlipidemia   . OA (osteoarthritis)   . PONV (postoperative nausea and vomiting)   . Unspecified hypothyroidism     Patient Active Problem List   Diagnosis Date Noted  . Multinodular goiter 08/30/2019  . Subclinical hyperthyroidism 08/30/2019  . Fatty liver 10/21/2016  . Thyroid disorder 06/11/2015  . Muscle ache of extremity 11/27/2014  . Anxiety state 09/23/2014  . Arthritis 07/24/2014  . Depression 06/13/2014  . Osteopenia 02/11/2014  . Pain in joint, multiple sites 02/11/2014  . Infected epidermoid cyst 02/11/2014  . Vaginal atrophy 08/17/2013  . Bloating 02/15/2013  . IBS (irritable bowel syndrome) 10/27/2012  . Hyperlipidemia 01/25/2012  . Hypertension 01/25/2012  . Iron deficiency anemia 01/19/2012    Past Surgical History:  Procedure Laterality Date  . ABDOMINAL HYSTERECTOMY    . APPENDECTOMY    . BOWEL RESECTION  05/11/2012   Procedure: LOW ANTERIOR BOWEL RESECTION;  Surgeon: Jamesetta So, MD;  Location: [facility];  Service: General;;  . CESAREAN SECTION     First one 77 , second on 1984  . COLON RESECTION  05/11/2012   Procedure: HAND ASSISTED LAPAROSCOPIC COLON RESECTION;  Surgeon: Jamesetta So, MD;  Location: [facility];  Service: General;  Laterality: N/A;  Laparoscopic Hand Assisted Partial Colectomy;    . COLONOSCOPY  2011-2008   Dr. Arnoldo Morale  . COLONOSCOPY  03/10/2012   Procedure: COLONOSCOPY;  Surgeon: Rogene Houston, MD;  [facility]: [facility];  Service: Endoscopy;  Laterality: N/A;  200  . Hysterctomy  1988  . TONSILLECTOMY      OB History   No obstetric history on file.      Home Medications    Prior to Admission medications   Medication Sig Start Date End Date Taking? Authorizing Provider  LORazepam (ATIVAN) 0.5 MG tablet TAKE 1 TABLET TWICE DAILY AS NEEDED FOR AGITATION AND/OR ANXIETY. 04/02/20   Alycia Rossetti, MD  amLODipine (NORVASC) 5 MG tablet Take 1 tablet (5 mg total) by mouth daily. 05/15/20   Alycia Rossetti, MD  amoxicillin (AMOXIL) 875 MG tablet Take 1 tablet (875 mg total) by mouth 2 (two) times daily. 07/06/20   Bjorn Pippin, PA-C  buPROPion (WELLBUTRIN XL) 300 MG 24 hr tablet TAKE  450mg   BY MOUTH ONCE DAILY. 08/18/19   Hunter, Modena Nunnery, MD  dicyclomine (BENTYL) 20 MG tablet TAKE 1 TABLET FOUR TIMES DAILY BEFORE MEALS AND AT BEDTIME 12/07/19   Huntingdon, Modena Nunnery, MD  ferrous sulfate 325 (65 FE) MG tablet Take 325 mg by mouth daily with breakfast.    [provider]  Garlic 283 MG CAPS Take by mouth.    [provider]  pravastatin (PRAVACHOL) 40 MG tablet TAKE (  1/2) TABLET BY MOUTH ONCE DAILY. 08/18/19   Alycia Rossetti, MD  propranolol ER (INDERAL LA) 80 MG 24 hr capsule TAKE (1) CAPSULE BY MOUTH AT BEDTIME. 08/18/19   Lincoln, Modena Nunnery, MD  vitamin E (VITAMIN E) 180 MG (400 UNITS) capsule Take 400 Units by mouth daily.    [provider]    Family History Family History  Problem Relation Age of Onset  . Rheum arthritis Mother   . Heart disease Mother   . Healthy Son   . Healthy Son   . Anesthesia problems Neg Hx   . Hypotension Neg Hx   . Malignant hyperthermia Neg Hx   . Pseudochol deficiency Neg Hx     Social History Social History   Tobacco Use  . Smoking status: Never Smoker  . Smokeless tobacco: Never Used   Substance Use Topics  . Alcohol use: No  . Drug use: No     Allergies   Codeine, Morphine and related, and Sulfa antibiotics   Review of Systems Review of Systems  Constitutional: Negative for fever.  HENT: Positive for congestion, ear pain and sinus pressure.   Eyes: Negative.   Respiratory: Negative for cough.   Skin: Negative.   Allergic/Immunologic: Positive for environmental allergies.     Physical Exam Triage Vital Signs ED Triage Vitals  Enc Vitals Group     BP 07/06/20 1102 (!) 143/84     Pulse Rate 07/06/20 1102 56     Resp 07/06/20 1102 17     Temp 07/06/20 1102 98.4 F (36.9 C)     Temp Source 07/06/20 1102 Oral     SpO2 07/06/20 1102 96 %     Weight 07/06/20 1101 180 lb (81.6 kg)     Height 07/06/20 1101 5\' 5"  (1.651 m)     Head Circumference --      Peak Flow --      Pain Score 07/06/20 1101 0     Pain Loc --      Pain Edu? --      Excl. in [place]? --    No data found.  Updated Vital Signs BP (!) 143/84 (BP Location: Right Arm)   Pulse 56   Temp 98.4 F (36.9 C) (Oral)   Resp 17   Ht 5\' 5"  (1.651 m)   Wt 180 lb (81.6 kg)   SpO2 96%   BMI 29.95 kg/m   Visual Acuity Right Eye Distance:   Left Eye Distance:   Bilateral Distance:    Right Eye Near:   Left Eye Near:    Bilateral Near:     Physical Exam Vitals and nursing note reviewed.  Constitutional:      General: She is not in acute distress.    Appearance: Normal appearance. She is not ill-appearing.  HENT:     Head: Normocephalic and atraumatic.     Right Ear: Tympanic membrane normal.     Left Ear: Tympanic membrane normal.     Mouth/Throat:     Mouth: Mucous membranes are dry.     Pharynx: Oropharynx is clear.  Eyes:     General:        Right eye: No discharge.        Left eye: No discharge.  Cardiovascular:     Rate and Rhythm: Normal rate and regular rhythm.  Pulmonary:     Effort: Pulmonary effort is normal.     Breath sounds: Normal breath sounds.  Skin:  General: Skin is warm and dry.  Neurological:     General: No focal deficit present.     Mental Status: She is alert.  Psychiatric:        Thought Content: Thought content normal.      UC Treatments / Results  Labs (all labs ordered are listed, but only abnormal results are displayed) Labs Reviewed - No data to display  EKG   Radiology No results found.  Procedures Procedures (including critical care time)  Medications Ordered in UC Medications - No data to display  Initial Impression / Assessment and Plan / UC Course  I have reviewed the triage vital signs and the nursing notes.  Pertinent labs & imaging results that were available during my care of the patient were reviewed by me and considered in my medical decision making (see chart for details).     Viral infection at 24 hours. Treat symptomatically. Prescription given for Amoxicillin if her symptoms progress out past 1 weeks. She is agreeable.  Final Clinical Impressions(s) / UC Diagnoses   Final diagnoses:  Acute upper respiratory infection  Viral illness     Discharge Instructions     Most likely viral. Antibiotics do not prevent a bacterial infection. So if symptoms continue out past a week then ok to take the antibiotics. Stay hydrated, and continue with nasal routine. Hope you feel better.    ED Prescriptions    Medication Sig Dispense Auth. Provider   amoxicillin (AMOXIL) 875 MG tablet Take 1 tablet (875 mg total) by mouth 2 (two) times daily. 14 tablet Bjorn Pippin, [place]     PDMP not reviewed this encounter.   Bjorn Pippin, PA-C 07/06/20 1135

## 2020-08-14 ENCOUNTER — Ambulatory Visit (HOSPITAL_COMMUNITY): Payer: Medicare PPO

## 2020-08-22 ENCOUNTER — Other Ambulatory Visit: Payer: Self-pay | Admitting: Family Medicine

## 2020-08-29 ENCOUNTER — Ambulatory Visit: Payer: Medicare Other | Admitting: "Endocrinology

## 2020-09-04 ENCOUNTER — Encounter: Payer: Medicare PPO | Admitting: Family Medicine

## 2020-09-05 ENCOUNTER — Ambulatory Visit: Payer: Medicare PPO | Attending: Internal Medicine

## 2020-09-05 DIAGNOSIS — Z23 Encounter for immunization: Secondary | ICD-10-CM

## 2020-09-05 NOTE — Progress Notes (Signed)
   Covid-19 Vaccination Clinic  Name:  Debra [name]    MRN: [id number] DOB: Apr 25, 1951  09/05/2020  Debra [name] was observed post Covid-19 immunization for 15 minutes without incident. She was provided with Vaccine Information Sheet and instruction to access the V-Safe system.   Debra [name] was instructed to call 911 with any severe reactions post vaccine: Marland Kitchen Difficulty breathing  . Swelling of face and throat  . A fast heartbeat  . A bad rash all over body  . Dizziness and weakness

## 2020-09-13 ENCOUNTER — Other Ambulatory Visit: Payer: Self-pay

## 2020-09-13 ENCOUNTER — Ambulatory Visit (INDEPENDENT_AMBULATORY_CARE_PROVIDER_SITE_OTHER): Payer: Medicare PPO

## 2020-09-13 DIAGNOSIS — Z23 Encounter for immunization: Secondary | ICD-10-CM

## 2020-09-23 ENCOUNTER — Encounter: Payer: Self-pay | Admitting: Family Medicine

## 2020-09-23 ENCOUNTER — Ambulatory Visit (INDEPENDENT_AMBULATORY_CARE_PROVIDER_SITE_OTHER): Payer: Medicare PPO | Admitting: Family Medicine

## 2020-09-23 ENCOUNTER — Other Ambulatory Visit: Payer: Self-pay

## 2020-09-23 VITALS — BP 122/70 | HR 56 | Temp 98.1°F | Resp 14 | Ht 65.0 in | Wt 174.0 lb

## 2020-09-23 DIAGNOSIS — Z1159 Encounter for screening for other viral diseases: Secondary | ICD-10-CM

## 2020-09-23 DIAGNOSIS — I10 Essential (primary) hypertension: Secondary | ICD-10-CM

## 2020-09-23 DIAGNOSIS — E7800 Pure hypercholesterolemia, unspecified: Secondary | ICD-10-CM

## 2020-09-23 DIAGNOSIS — E042 Nontoxic multinodular goiter: Secondary | ICD-10-CM

## 2020-09-23 DIAGNOSIS — Z0000 Encounter for general adult medical examination without abnormal findings: Secondary | ICD-10-CM | POA: Diagnosis not present

## 2020-09-23 DIAGNOSIS — H02403 Unspecified ptosis of bilateral eyelids: Secondary | ICD-10-CM

## 2020-09-23 MED ORDER — BUPROPION HCL ER (XL) 300 MG PO TB24
ORAL_TABLET | ORAL | 3 refills | Status: DC
Start: 2020-09-23 — End: 2021-12-02

## 2020-09-23 MED ORDER — BUPROPION HCL ER (XL) 150 MG PO TB24: 150.0000 mg | ORAL_TABLET | Freq: Every day | ORAL | 1 refills | Status: DC

## 2020-09-23 NOTE — Progress Notes (Signed)
Subjective:   Patient presents for Medicare Annual/Subsequent preventive examination.     MDD- she using wellbutrin 300mg  XL once a day, she will take the 450mg  Xl she is feeling more anxious which she has been at the past few months. She has ativan 0.5mg  on hand, typically takes for sleep andwith driving     Optometrist has noticed te lids drooping more she has not noted any change in her vision.   Review Past Medical/Family/Social: Per EMR    Risk Factors  Current exercise habits: Stays active Dietary issues discussed: Yes  Cardiac risk factors: HTN, Hyperlipidemia   Depression Screen  PHQ 9 SCORE 3  (Note: if answer to either of the following is "Yes", a more complete depression screening is indicated)  Over the past two weeks, have you felt down, depressed or hopeless? Yes Over the past two weeks, have you felt little interest or pleasure in doing things? No Have you lost interest or pleasure in daily life? No Do you often feel hopeless? No Do you cry easily over simple problems? No   Activities of Daily Living  In your present state of health, do you have any difficulty performing the following activities?:  Driving? No  Managing money? No  Feeding yourself? No  Getting from bed to chair? No  Climbing a flight of stairs? No  Preparing food and eating?: No  Bathing or showering? No  Getting dressed: No  Getting to the toilet? No  Using the toilet:No  Moving around from place to place: No  In the past year have you fallen or had a near fall?:No    Hearing Difficulties: No  Do you often ask people to speak up or repeat themselves? No  Do you experience ringing or noises in your ears? No Do you have difficulty understanding soft or whispered voices? No  Do you feel that you have a problem with memory? No Do you often misplace items? No  Do you feel safe at home? Yes  Cognitive Testing  Alert? Yes Normal Appearance?Yes  Oriented to person? Yes Place? Yes  Time?  Yes  Recall of three objects? Yes  Can perform simple calculations? Yes  Displays appropriate judgment?Yes  Can read the correct time from a watch face?Yes   List the Names of Other Physician/Practitioners you currently use:  Endocrinology Optometry   Screening Tests / Date UTD Colonoscopy                     Zostavax  PNA Mammogram  Influenza Vaccine  Tetanus/tdap Hep c SCREENING DUE   ROS:  GEN- denies fatigue, fever, weight loss,weakness, recent illness HEENT- denies eye drainage, change in vision, nasal discharge, CVS- denies chest pain, palpitations RESP- denies SOB, cough, wheeze ABD- denies N/V, change in stools, abd pain GU- denies dysuria, hematuria, dribbling, incontinence MSK- denies joint pain, muscle aches, injury Neuro- denies headache, dizziness, syncope, seizure activity  PHYSICAL Vitals reviewed  GEN- NAD, alert and oriented x3 HEENT- PERRL, EOMI, non injected sclera, pink conjunctiva, MMM, oropharynx clear mild drooping of bilateral upper lid Neck- Supple, mild  thryomegaly CVS- RRR, no murmur RESP-CTAB ABD-NABS,soft,NT,ND Psych- normal affect and mood  EXT- No edema Pulses- Radial, DP- 2+    Assessment:    Annual wellness medicare exam   Plan:    During the course of the visit the patient was educated and counseled about appropriate screening and preventive services including:   Prevention and immunizations up-to-date.  Hepatitis C screening  done today.  Patient blood pressure controlled no change in medication.  Lipidemia taking pravastatin.  She does have some drooping of the upper lids.  We will refer her to ophthalmology for evaluation  Depression with anxiety.  Recommend she consistently take the 450 mg extended release daily.  She wants to hold off on therapy as it has been quite difficult by therapist with her insurance.  She will continue with lorazepam as needed.  Thyroid multinodular goiter she will follow-up with endocrinology  for repeat ultrasound.  We will check TSH today.  Full code      Diet review for nutrition referral? Yes ____ Not Indicated __x__  Patient Instructions (the written plan) was given to the patient.  Medicare Attestation  I have personally reviewed:  The patient's medical and social history  Their use of alcohol, tobacco or illicit drugs  Their current medications and supplements  The patient's functional ability including ADLs,fall risks, home safety risks, cognitive, and hearing and visual impairment  Diet and physical activities  Evidence for depression or mood disorders  The patient's weight, height, BMI, and visual acuity have been recorded in the chart. I have made referrals, counseling, and provided education to the patient based on review of the above and I have provided the patient with a written personalized care plan for preventive services.

## 2020-09-23 NOTE — Patient Instructions (Addendum)
Referral to ophthalmology  F/u 6 months

## 2020-09-24 LAB — COMPREHENSIVE METABOLIC PANEL
AG Ratio: 1.9 (calc) (ref 1.0–2.5)
ALT: 27 U/L (ref 6–29)
AST: 19 U/L (ref 10–35)
Albumin: 4.5 g/dL (ref 3.6–5.1)
Alkaline phosphatase (APISO): 55 U/L (ref 37–153)
BUN/Creatinine Ratio: 21 (calc) (ref 6–22)
BUN: 24 mg/dL (ref 7–25)
CO2: 22 mmol/L (ref 20–32)
Calcium: 9.7 mg/dL (ref 8.6–10.4)
Chloride: 104 mmol/L (ref 98–110)
Creat: 1.17 mg/dL — ABNORMAL HIGH (ref 0.60–0.93)
Globulin: 2.4 g/dL (calc) (ref 1.9–3.7)
Glucose, Bld: 97 mg/dL (ref 65–99)
Potassium: 4.4 mmol/L (ref 3.5–5.3)
Sodium: 138 mmol/L (ref 135–146)
Total Bilirubin: 0.5 mg/dL (ref 0.2–1.2)
Total Protein: 6.9 g/dL (ref 6.1–8.1)

## 2020-09-24 LAB — CBC WITH DIFFERENTIAL/PLATELET
Absolute Monocytes: 531 cells/uL (ref 200–950)
Basophils Absolute: 59 cells/uL (ref 0–200)
Basophils Relative: 1 %
Eosinophils Absolute: 171 cells/uL (ref 15–500)
Eosinophils Relative: 2.9 %
HCT: 45 % (ref 35.0–45.0)
Hemoglobin: 15.1 g/dL (ref 11.7–15.5)
Lymphs Abs: 2124 cells/uL (ref 850–3900)
MCH: 30.8 pg (ref 27.0–33.0)
MCHC: 33.6 g/dL (ref 32.0–36.0)
MCV: 91.6 fL (ref 80.0–100.0)
MPV: 10.7 fL (ref 7.5–12.5)
Monocytes Relative: 9 %
Neutro Abs: 3015 cells/uL (ref 1500–7800)
Neutrophils Relative %: 51.1 %
Platelets: 225 10*3/uL (ref 140–400)
RBC: 4.91 10*6/uL (ref 3.80–5.10)
RDW: 12.4 % (ref 11.0–15.0)
Total Lymphocyte: 36 %
WBC: 5.9 10*3/uL (ref 3.8–10.8)

## 2020-09-24 LAB — LIPID PANEL
Cholesterol: 195 mg/dL (ref <200)
HDL: 55 mg/dL (ref >=50)
LDL Cholesterol (Calc): 109 mg/dL (calc) — ABNORMAL HIGH
Non-HDL Cholesterol (Calc): 140 mg/dL (calc) — ABNORMAL HIGH (ref <130)
Total CHOL/HDL Ratio: 3.5 (calc) (ref <5.0)
Triglycerides: 190 mg/dL — ABNORMAL HIGH (ref <150)

## 2020-09-24 LAB — HEPATITIS C ANTIBODY
Hepatitis C Ab: NONREACTIVE
SIGNAL TO CUT-OFF: 0.04 (ref <1.00)

## 2020-09-24 LAB — TSH: TSH: 5.26 mIU/L — ABNORMAL HIGH (ref 0.40–4.50)

## 2020-09-25 ENCOUNTER — Telehealth: Payer: Self-pay

## 2020-09-25 ENCOUNTER — Encounter: Payer: Self-pay | Admitting: "Endocrinology

## 2020-09-25 ENCOUNTER — Telehealth (INDEPENDENT_AMBULATORY_CARE_PROVIDER_SITE_OTHER): Payer: Medicare PPO | Admitting: "Endocrinology

## 2020-09-25 DIAGNOSIS — E042 Nontoxic multinodular goiter: Secondary | ICD-10-CM | POA: Diagnosis not present

## 2020-09-25 DIAGNOSIS — E039 Hypothyroidism, unspecified: Secondary | ICD-10-CM

## 2020-09-25 MED ORDER — LEVOTHYROXINE SODIUM 25 MCG PO TABS: 25.0000 ug | ORAL_TABLET | Freq: Every day | ORAL | 1 refills | Status: DC

## 2020-09-25 NOTE — Progress Notes (Signed)
09/25/2020, 12:37 PM                                  Endocrinology Telehealth Visit Follow up Note -During COVID -19 Pandemic  This visit type was conducted  via telephone due to national recommendations for restrictions regarding the COVID-19 Pandemic  in an effort to limit this patient's exposure and mitigate transmission of the corona virus.   I connected with Debra [name] on 09/25/2020   by telephone and verified that I am speaking with the correct person using two identifiers. Debra [name], [birth year]. she has verbally consented to this visit.  I was in my office and patient was in her residence. No other persons were with me during the encounter. All issues noted in this document were discussed and addressed. The format was not optimal for physical exam.  Subjective:    Patient ID: Debra [name], female    DOB: July 17, 1951, PCP Alycia Rossetti, MD   Past Medical History:  Diagnosis Date  . Diverticulitis   . Diverticulosis of colon (without mention of hemorrhage)   . Esophageal reflux   . Generalized anxiety disorder   . Hiatal hernia   . Hypertension   . Iron deficiency anemia 01/19/2012  . Mixed hyperlipidemia   . OA (osteoarthritis)   . PONV (postoperative nausea and vomiting)   . Unspecified hypothyroidism    Past Surgical History:  Procedure Laterality Date  . ABDOMINAL HYSTERECTOMY    . APPENDECTOMY    . BOWEL RESECTION  05/11/2012   Procedure: LOW ANTERIOR BOWEL RESECTION;  Surgeon: Jamesetta So, MD;  Location: [facility];  Service: General;;  . CESAREAN SECTION     First one 82 , second on 1984  . COLON RESECTION  05/11/2012   Procedure: HAND ASSISTED LAPAROSCOPIC COLON RESECTION;  Surgeon: Jamesetta So, MD;  Location: [facility];  Service: General;  Laterality: N/A;  Laparoscopic Hand Assisted Partial Colectomy;  . COLONOSCOPY  2011-2008   Dr. Arnoldo Morale  . COLONOSCOPY  03/10/2012   Procedure: COLONOSCOPY;   Surgeon: Rogene Houston, MD;  [facility];  Service: Endoscopy;  Laterality: N/A;  200  . Hysterctomy  1988  . TONSILLECTOMY     Social History   Socioeconomic History  . Marital status: Married    Spouse name: Not on file  . Number of children: Not on file  . Years of education: Not on file  . Highest education level: Not on file  Occupational History  . Not on file  Tobacco Use  . Smoking status: Never Smoker  . Smokeless tobacco: Never Used  Substance and Sexual Activity  . Alcohol use: No  . Drug use: No  . Sexual activity: Yes    Birth control/protection: Surgical  Other Topics Concern  . Not on file  Social History Narrative  . Not on file   Social Determinants of Health   Financial Resource Strain:   . Difficulty of Paying Living Expenses: Not on file  Food Insecurity:   . Worried About Charity fundraiser in the Last Year: Not on file  . Ran Out  of Food in the Last Year: Not on file  Transportation Needs:   . Lack of Transportation (Medical): Not on file  . Lack of Transportation (Non-Medical): Not on file  Physical Activity:   . Days of Exercise per Week: Not on file  . Minutes of Exercise per Session: Not on file  Stress:   . Feeling of Stress : Not on file  Social Connections:   . Frequency of Communication with Friends and Family: Not on file  . Frequency of Social Gatherings with Friends and Family: Not on file  . Attends Religious Services: Not on file  . Active Member of Clubs or Organizations: Not on file  . Attends Archivist Meetings: Not on file  . Marital Status: Not on file   Family History  Problem Relation Age of Onset  . Rheum arthritis Mother   . Heart disease Mother   . Healthy Son   . Healthy Son   . Anesthesia problems Neg Hx   . Hypotension Neg Hx   . Malignant hyperthermia Neg Hx   . Pseudochol deficiency Neg Hx    Outpatient Encounter Medications as of 09/25/2020  Medication Sig  . amLODipine  (NORVASC) 5 MG tablet Take 1 tablet (5 mg total) by mouth daily. (Patient taking differently: Take 5 mg by mouth daily. 1/2 tab PO  AM, 1 tab PO  HS)  . buPROPion (WELLBUTRIN XL) 150 MG 24 hr tablet Take 1 tablet (150 mg total) by mouth daily.  Marland Kitchen buPROPion (WELLBUTRIN XL) 300 MG 24 hr tablet TAKE  450mg   BY MOUTH ONCE DAILY.  Marland Kitchen dicyclomine (BENTYL) 20 MG tablet TAKE 1 TABLET FOUR TIMES DAILY BEFORE MEALS AND AT BEDTIME  . ferrous sulfate 325 (65 FE) MG tablet Take 325 mg by mouth daily with breakfast.  . levothyroxine (SYNTHROID) 25 MCG tablet Take 1 tablet (25 mcg total) by mouth daily before breakfast.  . LORazepam (ATIVAN) 0.5 MG tablet TAKE 1 TABLET TWICE DAILY AS NEEDED FOR AGITATION AND/OR ANXIETY.  . pravastatin (PRAVACHOL) 40 MG tablet TAKE 1/2 TABLET BY MOUTH ONCE DAILY.  Marland Kitchen propranolol ER (INDERAL LA) 80 MG 24 hr capsule TAKE (1) CAPSULE BY MOUTH AT BEDTIME.   No facility-administered encounter medications on file as of 09/25/2020.   ALLERGIES: Allergies  Allergen Reactions  . Codeine Nausea And Vomiting  . Morphine And Related Nausea And Vomiting  . Sulfa Antibiotics Hives and Itching    VACCINATION STATUS: Immunization History  Administered Date(s) Administered  . Fluad Quad(high Dose 65+) 08/18/2019, 09/13/2020  . Influenza Split 10/21/2013  . Influenza, High Dose Seasonal PF 09/20/2018  . Influenza,inj,Quad PF,6+ Mos 08/19/2015  . Influenza-Unspecified 09/12/2007  . PFIZER SARS-COV-2 Vaccination 01/12/2020, 02/06/2020, 09/05/2020  . Pneumococcal Conjugate-13 08/19/2015  . Pneumococcal Polysaccharide-23 02/09/2014  . Tdap 05/07/2013  . Zoster 07/08/2013    HPI Debra [name] is 69 y.o. female who is called in prior to her scheduled appointment because of abnormal thyroid function test.  She was seen in consultation for  multinodular goiter and subclinical hypothyroidism requested by Alycia Rossetti, MD. -She was on observation status for multinodular goiter with  subclinical hypothyroidism.  She has no new complaints.  Her TSH was significantly elevated at 5.26 during her visit to her PMD.    She denies any prior history of thyroid dysfunction.  She reports various forms of thyroid dysfunction in her father members including her mother and 2 aunts.  She denies any family history of thyroid malignancy.  She had previously elevated TSH of 5.54.   August 25, 2019 thyroid ultrasound reveals multiple nodules in bilateral thyroid lobes, largest one 1.2 cm in the left lobe, nonsuspicious. -She denies any dysphagia, shortness of breath, nor voice change.  She denies tremors, heat intolerance, palpitations.  Review of Systems Progressive weight loss.  Objective:    There were no vitals taken for this visit.  Wt Readings from Last 3 Encounters:  09/23/20 174 lb (78.9 kg)  07/06/20 180 lb (81.6 kg)  05/21/20 182 lb (82.6 kg)    Physical Exam   CMP ( most recent) CMP     Component Value Date/Time   NA 138 09/23/2020 1120   K 4.4 09/23/2020 1120   CL 104 09/23/2020 1120   CO2 22 09/23/2020 1120   GLUCOSE 97 09/23/2020 1120   BUN 24 09/23/2020 1120   CREATININE 1.17 (H) 09/23/2020 1120   CALCIUM 9.7 09/23/2020 1120   PROT 6.9 09/23/2020 1120   ALBUMIN 4.3 07/27/2017 0801   ALBUMIN 4.3 07/27/2017 0801   AST 19 09/23/2020 1120   ALT 27 09/23/2020 1120   ALKPHOS 54 07/27/2017 0801   ALKPHOS 54 07/27/2017 0801   BILITOT 0.5 09/23/2020 1120   GFRNONAA 56 (L) 07/27/2017 0801   GFRAA 65 07/27/2017 0801    Lipid Panel     Component Value Date/Time   CHOL 195 09/23/2020 1120   TRIG 190 (H) 09/23/2020 1120   HDL 55 09/23/2020 1120   CHOLHDL 3.5 09/23/2020 1120   VLDL 27 07/27/2017 0801   LDLCALC 109 (H) 09/23/2020 1120      Lab Results  Component Value Date   TSH 5.26 (H) 09/23/2020   TSH 3.73 05/21/2020   TSH 5.54 (H) 08/16/2019   TSH 3.97 10/02/2016   TSH 4.06 01/20/2016   TSH 1.281 11/26/2014   TSH 2.616 12/06/2013   FREET4 1.2  05/21/2020   FREET4 1.3 08/16/2019   FREET4 1.1 10/02/2016   FREET4 1.3 01/20/2016  August 25, 2019 thyroid ultrasound IMPRESSION: 1. Findings suggestive of multinodular goiter. 2. Nodule #4 which is 1.2 cm meets imaging criteria to recommend a 1 year follow-up as clinically indicated.   Assessment & Plan:   1. Multinodular goiter 2.  Hypothyroidism Based on her series of thyroid function test, she has mild clinical hypothyroidism.  She would benefit from early initiation of thyroid hormone supplement. I discussed and initiated levothyroxine 25 mcg p.o. daily before breakfast.   - We discussed about the correct intake of her thyroid hormone, on empty stomach at fasting, with water, separated by at least 30 minutes from breakfast and other medications,  and separated by more than 4 hours from calcium, iron, multivitamins, acid reflux medications (PPIs). -Patient is made aware of the fact that thyroid hormone replacement is needed for life, dose to be adjusted by periodic monitoring of thyroid function tests.  - Based on reviews of her previous thyroid ultrasound reports, she has multinodular goiter . -She is not clinically symptomatic of mass-effect nor thyroid dysfunction.  She will still need repeat surveillance thyroid ultrasound in November 2021 prior to her next visit.  - I advised her  to maintain close follow up with Alycia Rossetti, MD for primary care needs.      - Time spent on this patient care encounter:  20 minutes of which 50% was spent in  counseling and the rest reviewing  her current and  previous labs / studies and medications  doses and developing a  plan for long term care. Debra [name]  participated in the discussions, expressed understanding, and voiced agreement with the above plans.  All questions were answered to her satisfaction. she is encouraged to contact clinic should she have any questions or concerns prior to her return visit.  Follow up  plan: Return for Keep Reg. Appt. with Pre-visit Labs, Thyroid / Neck Ultrasound.   Glade Lloyd, MD [facility] [address] [place], [place] [address] Phone: [phone]  Fax: [phone]     09/25/2020, 12:37 PM  This note was partially dictated with voice recognition software. Similar sounding words can be transcribed inadequately or may not  be corrected upon review.

## 2020-09-25 NOTE — Telephone Encounter (Signed)
Rx resent to [facility].

## 2020-09-25 NOTE — Telephone Encounter (Signed)
Pt said that [facility] never received the RX for levothyroxine (SYNTHROID) 25 MCG tablet. Can you re send?

## 2020-10-10 DIAGNOSIS — L820 Inflamed seborrheic keratosis: Secondary | ICD-10-CM | POA: Diagnosis not present

## 2020-10-10 DIAGNOSIS — Z08 Encounter for follow-up examination after completed treatment for malignant neoplasm: Secondary | ICD-10-CM | POA: Diagnosis not present

## 2020-10-10 DIAGNOSIS — Z85828 Personal history of other malignant neoplasm of skin: Secondary | ICD-10-CM | POA: Diagnosis not present

## 2020-10-24 ENCOUNTER — Other Ambulatory Visit: Payer: Self-pay | Admitting: Family Medicine

## 2020-10-28 DIAGNOSIS — R69 Illness, unspecified: Secondary | ICD-10-CM | POA: Diagnosis not present

## 2020-10-29 ENCOUNTER — Other Ambulatory Visit: Payer: Self-pay

## 2020-10-29 ENCOUNTER — Ambulatory Visit (HOSPITAL_COMMUNITY)
Admission: RE | Admit: 2020-10-29 | Discharge: 2020-10-29 | Disposition: A | Discharge status: Home / Self Care | Payer: Medicare PPO | Source: Ambulatory Visit | Attending: "Endocrinology | Admitting: "Endocrinology

## 2020-10-29 DIAGNOSIS — E042 Nontoxic multinodular goiter: Secondary | ICD-10-CM | POA: Insufficient documentation

## 2020-11-05 DIAGNOSIS — E039 Hypothyroidism, unspecified: Secondary | ICD-10-CM | POA: Diagnosis not present

## 2020-11-06 ENCOUNTER — Ambulatory Visit: Payer: Medicare PPO | Admitting: "Endocrinology

## 2020-11-06 LAB — T4, FREE: Free T4: 1.36 ng/dL (ref 0.82–1.77)

## 2020-11-06 LAB — TSH: TSH: 2.37 u[IU]/mL (ref 0.450–4.500)

## 2020-11-07 ENCOUNTER — Other Ambulatory Visit: Payer: Self-pay | Admitting: Family Medicine

## 2020-11-07 ENCOUNTER — Ambulatory Visit: Payer: Medicare PPO | Admitting: "Endocrinology

## 2020-11-07 DIAGNOSIS — H0279 Other degenerative disorders of eyelid and periocular area: Secondary | ICD-10-CM | POA: Diagnosis not present

## 2020-11-07 DIAGNOSIS — H53483 Generalized contraction of visual field, bilateral: Secondary | ICD-10-CM | POA: Diagnosis not present

## 2020-11-07 DIAGNOSIS — H02834 Dermatochalasis of left upper eyelid: Secondary | ICD-10-CM | POA: Diagnosis not present

## 2020-11-07 DIAGNOSIS — H57813 Brow ptosis, bilateral: Secondary | ICD-10-CM | POA: Diagnosis not present

## 2020-11-07 DIAGNOSIS — H02831 Dermatochalasis of right upper eyelid: Secondary | ICD-10-CM | POA: Diagnosis not present

## 2020-11-11 ENCOUNTER — Other Ambulatory Visit: Payer: Self-pay | Admitting: Family Medicine

## 2020-11-12 ENCOUNTER — Other Ambulatory Visit: Payer: Self-pay | Admitting: Family Medicine

## 2020-11-14 ENCOUNTER — Ambulatory Visit: Payer: Medicare PPO | Admitting: "Endocrinology

## 2020-11-14 ENCOUNTER — Encounter: Payer: Self-pay | Admitting: "Endocrinology

## 2020-11-14 ENCOUNTER — Other Ambulatory Visit: Payer: Self-pay

## 2020-11-14 VITALS — BP 120/62 | HR 56 | Ht 65.0 in | Wt 176.5 lb

## 2020-11-14 DIAGNOSIS — E039 Hypothyroidism, unspecified: Secondary | ICD-10-CM | POA: Diagnosis not present

## 2020-11-14 DIAGNOSIS — E042 Nontoxic multinodular goiter: Secondary | ICD-10-CM

## 2020-11-14 MED ORDER — LEVOTHYROXINE SODIUM 25 MCG PO TABS: 25.0000 ug | ORAL_TABLET | Freq: Every day | ORAL | 1 refills | Status: DC

## 2020-11-14 NOTE — Progress Notes (Signed)
11/14/2020, 9:11 AM   Endocrinology follow-up note  Subjective:    Patient ID: Debra [name], female    DOB: June 26, 1951, PCP Alycia Rossetti, MD   Past Medical History:  Diagnosis Date  . Diverticulitis   . Diverticulosis of colon (without mention of hemorrhage)   . Esophageal reflux   . Generalized anxiety disorder   . Hiatal hernia   . Hypertension   . Iron deficiency anemia 01/19/2012  . Mixed hyperlipidemia   . OA (osteoarthritis)   . PONV (postoperative nausea and vomiting)   . Unspecified hypothyroidism    Past Surgical History:  Procedure Laterality Date  . ABDOMINAL HYSTERECTOMY    . APPENDECTOMY    . BOWEL RESECTION  05/11/2012   Procedure: LOW ANTERIOR BOWEL RESECTION;  Surgeon: Jamesetta So, MD;  Location: [facility];  Service: General;;  . CESAREAN SECTION     First one 63 , second on 1984  . COLON RESECTION  05/11/2012   Procedure: HAND ASSISTED LAPAROSCOPIC COLON RESECTION;  Surgeon: Jamesetta So, MD;  Location: [facility];  Service: General;  Laterality: N/A;  Laparoscopic Hand Assisted Partial Colectomy;  . COLONOSCOPY  2011-2008   Dr. Arnoldo Morale  . COLONOSCOPY  03/10/2012   Procedure: COLONOSCOPY;  Surgeon: Rogene Houston, MD;  Location: [facility];  Service: Endoscopy;  Laterality: N/A;  200  . Hysterctomy  1988  . TONSILLECTOMY     Social History   Socioeconomic History  . Marital status: Married    Spouse name: Not on file  . Number of children: Not on file  . Years of education: Not on file  . Highest education level: Not on file  Occupational History  . Not on file  Tobacco Use  . Smoking status: Never Smoker  . Smokeless tobacco: Never Used  Substance and Sexual Activity  . Alcohol use: No  . Drug use: No  . Sexual activity: Yes    Birth control/protection: Surgical  Other Topics Concern  . Not on file  Social History Narrative  . Not on file   Social Determinants of Health    Financial Resource Strain: Not on file  Food Insecurity: Not on file  Transportation Needs: Not on file  Physical Activity: Not on file  Stress: Not on file  Social Connections: Not on file   Family History  Problem Relation Age of Onset  . Rheum arthritis Mother   . Heart disease Mother   . Healthy Son   . Healthy Son   . Anesthesia problems Neg Hx   . Hypotension Neg Hx   . Malignant hyperthermia Neg Hx   . Pseudochol deficiency Neg Hx    Outpatient Encounter Medications as of 11/14/2020  Medication Sig  . amLODipine (NORVASC) 5 MG tablet TAKE (1) TABLET BY MOUTH ONCE DAILY.  Marland Kitchen buPROPion (WELLBUTRIN XL) 150 MG 24 hr tablet Take 1 tablet (150 mg total) by mouth daily.  Marland Kitchen buPROPion (WELLBUTRIN XL) 300 MG 24 hr tablet TAKE  450mg   BY MOUTH ONCE DAILY.  Marland Kitchen dicyclomine (BENTYL) 20 MG tablet TAKE 1 TABLET FOUR TIMES DAILY BEFORE MEALS AND AT BEDTIME  . ferrous sulfate 325 (65 FE) MG tablet Take 325 mg by  mouth daily with breakfast.  . levothyroxine (SYNTHROID) 25 MCG tablet Take 1 tablet (25 mcg total) by mouth daily before breakfast.  . LORazepam (ATIVAN) 0.5 MG tablet TAKE 1 TABLET TWICE DAILY AS NEEDED FOR AGITATION AND/OR ANXIETY.  . pravastatin (PRAVACHOL) 40 MG tablet TAKE 1/2 TABLET BY MOUTH ONCE DAILY.  Marland Kitchen propranolol ER (INDERAL LA) 80 MG 24 hr capsule TAKE (1) CAPSULE BY MOUTH AT BEDTIME.  . [DISCONTINUED] levothyroxine (SYNTHROID) 25 MCG tablet Take 1 tablet (25 mcg total) by mouth daily before breakfast.   No facility-administered encounter medications on file as of 11/14/2020.   ALLERGIES: Allergies  Allergen Reactions  . Codeine Nausea And Vomiting  . Morphine And Related Nausea And Vomiting  . Sulfa Antibiotics Hives and Itching    VACCINATION STATUS: Immunization History  Administered Date(s) Administered  . Fluad Quad(high Dose 65+) 08/18/2019, 09/13/2020  . Influenza Split 10/21/2013  . Influenza, High Dose Seasonal PF 09/20/2018  . Influenza,inj,Quad  PF,6+ Mos 08/19/2015  . Influenza-Unspecified 09/12/2007  . PFIZER SARS-COV-2 Vaccination 01/12/2020, 02/06/2020, 09/05/2020  . Pneumococcal Conjugate-13 08/19/2015  . Pneumococcal Polysaccharide-23 02/09/2014  . Tdap 05/07/2013  . Zoster 07/08/2013    HPI Debra [name] is 69 y.o. female who is returning to follow-up for hypothyroidism, multinodular goiter.   PMD: Alycia Rossetti, MD. -She was on observation status for multinodular goiter with  hypothyroidism.  She was initiated on low-dose levothyroxine 25 mcg p.o. daily before breakfast which caused correction of her thyroid function test to target levels.  She has no new complaints today.    She denies any prior history of thyroid surgery nor ablation.   She reports various forms of thyroid dysfunction in her father members including her mother and 2 aunts.  She denies any family history of thyroid malignancy.   August 25, 2019 thyroid ultrasound reveals multiple nodules in bilateral thyroid lobes, largest one 1.2 cm in the left lobe, nonsuspicious.  Her October 29, 2020 thyroid ultrasound is also unremarkable. -She denies any dysphagia, shortness of breath, nor voice change.  She denies tremors, heat intolerance, palpitations.  Review of Systems Progressive weight loss.  Objective:    BP 120/62   Pulse (!) 56   Ht 5\' 5"  (1.651 m)   Wt 176 lb 8 oz (80.1 kg)   BMI 29.37 kg/m   Wt Readings from Last 3 Encounters:  11/14/20 176 lb 8 oz (80.1 kg)  09/23/20 174 lb (78.9 kg)  07/06/20 180 lb (81.6 kg)    Physical Exam   CMP ( most recent) CMP     Component Value Date/Time   NA 138 09/23/2020 1120   K 4.4 09/23/2020 1120   CL 104 09/23/2020 1120   CO2 22 09/23/2020 1120   GLUCOSE 97 09/23/2020 1120   BUN 24 09/23/2020 1120   CREATININE 1.17 (H) 09/23/2020 1120   CALCIUM 9.7 09/23/2020 1120   PROT 6.9 09/23/2020 1120   ALBUMIN 4.3 07/27/2017 0801   ALBUMIN 4.3 07/27/2017 0801   AST 19 09/23/2020 1120   ALT 27  09/23/2020 1120   ALKPHOS 54 07/27/2017 0801   ALKPHOS 54 07/27/2017 0801   BILITOT 0.5 09/23/2020 1120   GFRNONAA 56 (L) 07/27/2017 0801   GFRAA 65 07/27/2017 0801    Lipid Panel     Component Value Date/Time   CHOL 195 09/23/2020 1120   TRIG 190 (H) 09/23/2020 1120   HDL 55 09/23/2020 1120   CHOLHDL 3.5 09/23/2020 1120   VLDL 27 07/27/2017 0801  LDLCALC 109 (H) 09/23/2020 1120      Lab Results  Component Value Date   TSH 2.370 11/05/2020   TSH 5.26 (H) 09/23/2020   TSH 3.73 05/21/2020   TSH 5.54 (H) 08/16/2019   TSH 3.97 10/02/2016   TSH 4.06 01/20/2016   TSH 1.281 11/26/2014   TSH 2.616 12/06/2013   FREET4 1.36 11/05/2020   FREET4 1.2 05/21/2020   FREET4 1.3 08/16/2019   FREET4 1.1 10/02/2016   FREET4 1.3 01/20/2016  August 25, 2019 thyroid ultrasound IMPRESSION: 1. Findings suggestive of multinodular goiter. 2. Nodule #4 which is 1.2 cm meets imaging criteria to recommend a 1 year follow-up as clinically indicated.   October 29, 2020 thyroid ultrasound. Right lobe measures 4.3 cm, left lobe measured 4.4 cm with 1.4 cm left lobe nodule remained stable. IMPRESSION: Multinodular thyroid again demonstrated.  Left inferior thyroid nodule (labeled 4, 1.4 cm, TR 4) and right inferior thyroid nodule (labeled 6, 1.1 cm, TR 4) both meet criteria for surveillance, as designated by the newly established ACR TI-RADS criteria. Surveillance ultrasound study recommended to be performed annually up to 5 years.  Assessment & Plan:   1. Multinodular goiter 2.  Hypothyroidism  Her previsit thyroid function tests are consistent with appropriate replacement.  She is advised to continue levothyroxine 25 mcg p.o. daily before breakfast.   - We discussed about the correct intake of her thyroid hormone, on empty stomach at fasting, with water, separated by at least 30 minutes from breakfast and other medications,  and separated by more than 4 hours from calcium, iron,  multivitamins, acid reflux medications (PPIs). -Patient is made aware of the fact that thyroid hormone replacement is needed for life, dose to be adjusted by periodic monitoring of thyroid function tests.   - Based on reviews of her previous thyroid ultrasound reports, she has multinodular goiter , unchanged from her last imaging studies. -She is not clinically symptomatic of mass-effect nor thyroid dysfunction.  Recommendations by radiology, she may need continued surveillance to document stability for 5 years.  She will be considered for another ultrasound in 1-2 years.    - I advised her  to maintain close follow up with Alycia Rossetti, MD for primary care needs.      - Time spent on this patient care encounter:  20 minutes of which 50% was spent in  counseling and the rest reviewing  her current and  previous labs / studies and medications  doses and developing a plan for long term care. Debra [name]  participated in the discussions, expressed understanding, and voiced agreement with the above plans.  All questions were answered to her satisfaction. she is encouraged to contact clinic should she have any questions or concerns prior to her return visit.   Follow up plan: Return in about 6 months (around 05/15/2021) for F/U with Pre-visit Labs.   Glade Lloyd, MD [facility] [address] [place], [place] [address] Phone: [phone]  Fax: [phone]     11/14/2020, 9:11 AM  This note was partially dictated with voice recognition software. Similar sounding words can be transcribed inadequately or may not  be corrected upon review.

## 2021-01-07 DIAGNOSIS — H53482 Generalized contraction of visual field, left eye: Secondary | ICD-10-CM | POA: Diagnosis not present

## 2021-01-07 DIAGNOSIS — H53483 Generalized contraction of visual field, bilateral: Secondary | ICD-10-CM | POA: Diagnosis not present

## 2021-01-07 DIAGNOSIS — H53481 Generalized contraction of visual field, right eye: Secondary | ICD-10-CM | POA: Diagnosis not present

## 2021-01-15 ENCOUNTER — Other Ambulatory Visit: Payer: Self-pay | Admitting: Family Medicine

## 2021-01-22 DIAGNOSIS — H02831 Dermatochalasis of right upper eyelid: Secondary | ICD-10-CM | POA: Diagnosis not present

## 2021-01-22 DIAGNOSIS — H57813 Brow ptosis, bilateral: Secondary | ICD-10-CM | POA: Diagnosis not present

## 2021-01-22 DIAGNOSIS — H02834 Dermatochalasis of left upper eyelid: Secondary | ICD-10-CM | POA: Diagnosis not present

## 2021-01-22 DIAGNOSIS — H0279 Other degenerative disorders of eyelid and periocular area: Secondary | ICD-10-CM | POA: Diagnosis not present

## 2021-01-22 DIAGNOSIS — H53483 Generalized contraction of visual field, bilateral: Secondary | ICD-10-CM | POA: Diagnosis not present

## 2021-01-28 IMAGING — US US THYROID
1 series · 13 of 25 positions shown · non-contrast
Comparison: 08/25/2019

CLINICAL DATA: 69-year-old female with a history of multinodular
thyroid

EXAM:
THYROID ULTRASOUND
TECHNIQUE: Ultrasound examination of the thyroid gland and adjacent soft
tissues was performed.

[Series 1: us thyroid · 13 of 63 slices shown]
[im 1/63]
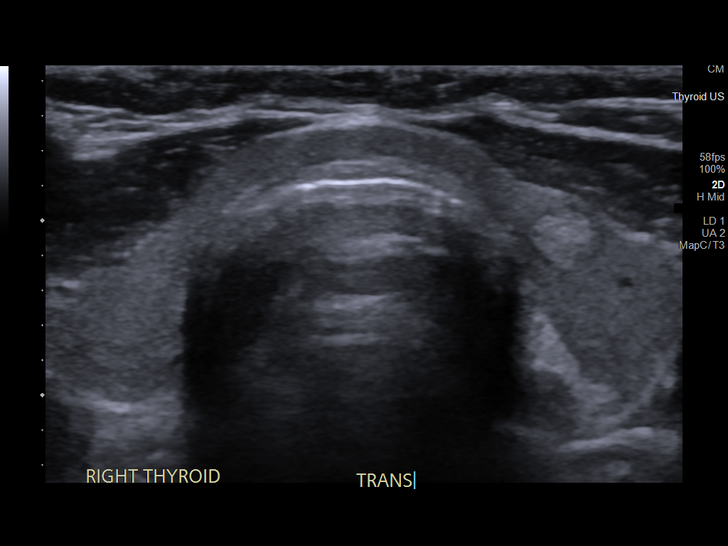
[im 6/63]
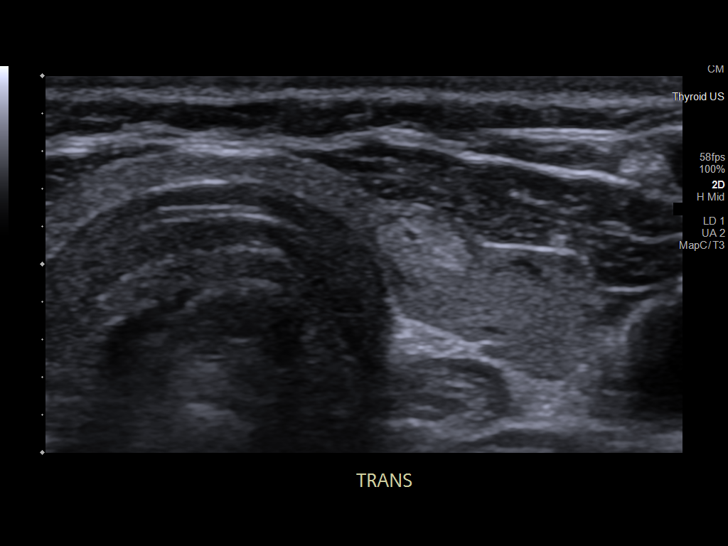
[im 11/63]
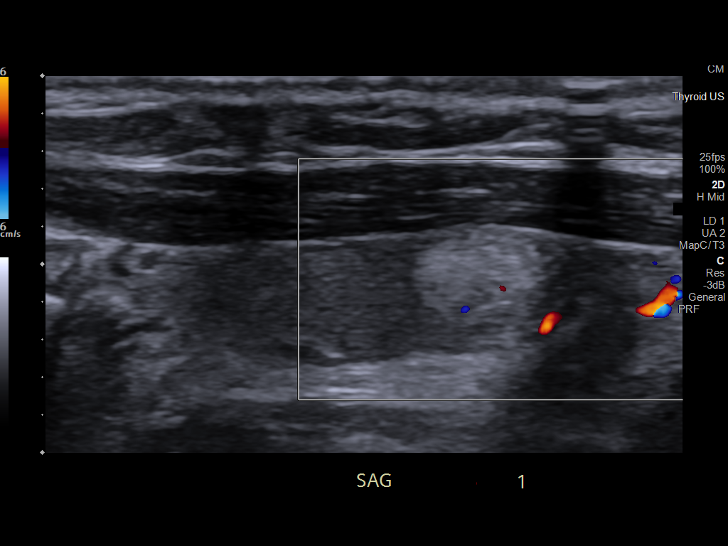
[im 16/63]
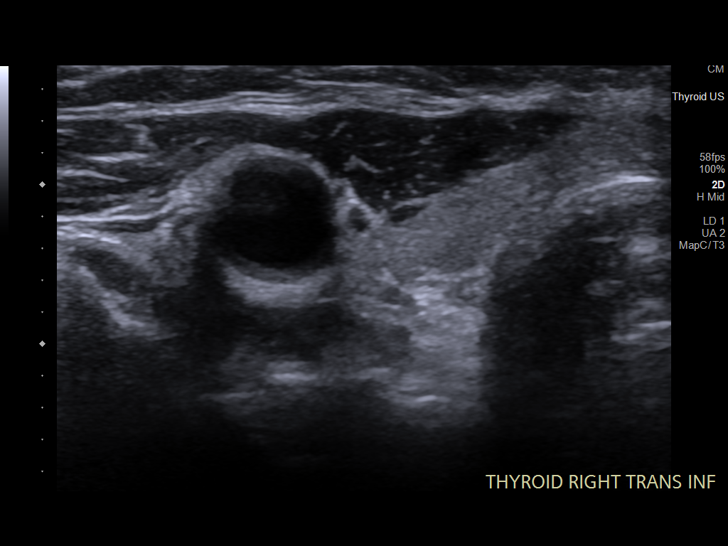
[im 21/63]
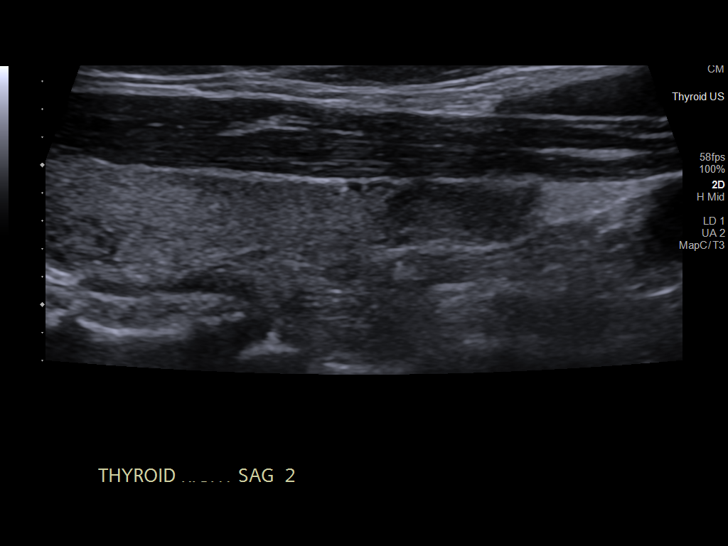
[im 26/63]
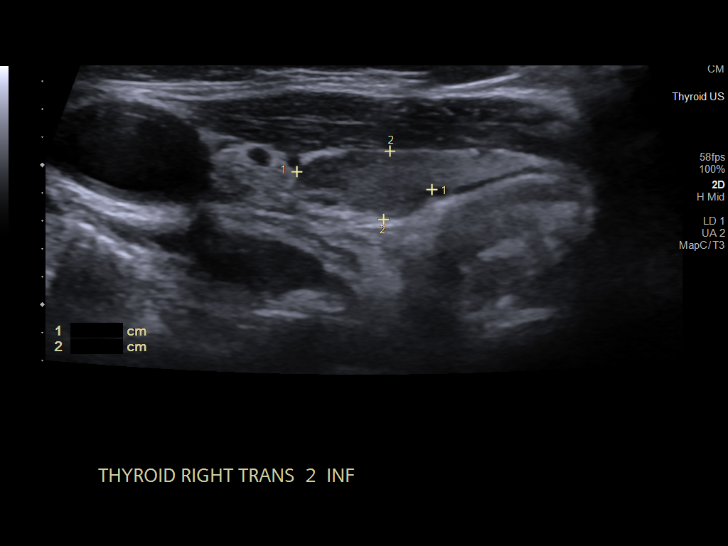
[im 32/63]
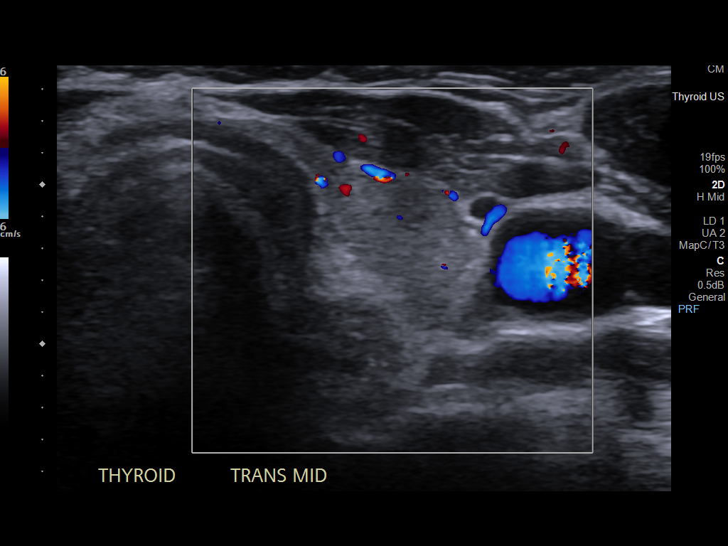
[im 37/63]
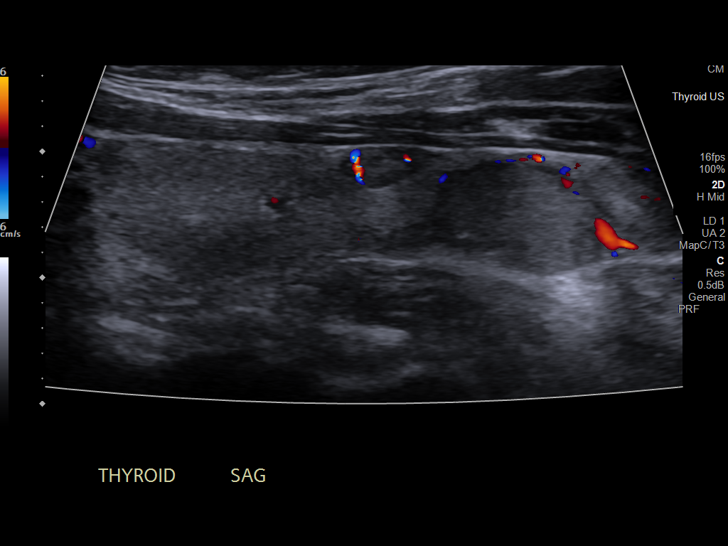
[im 42/63]
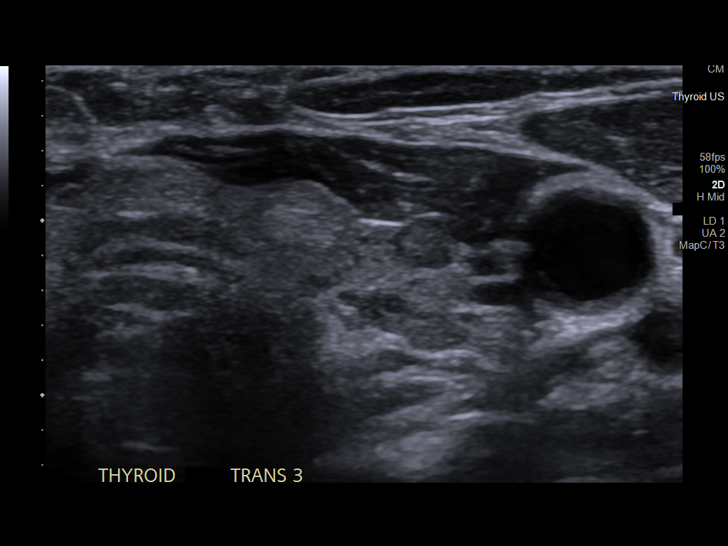
[im 47/63]
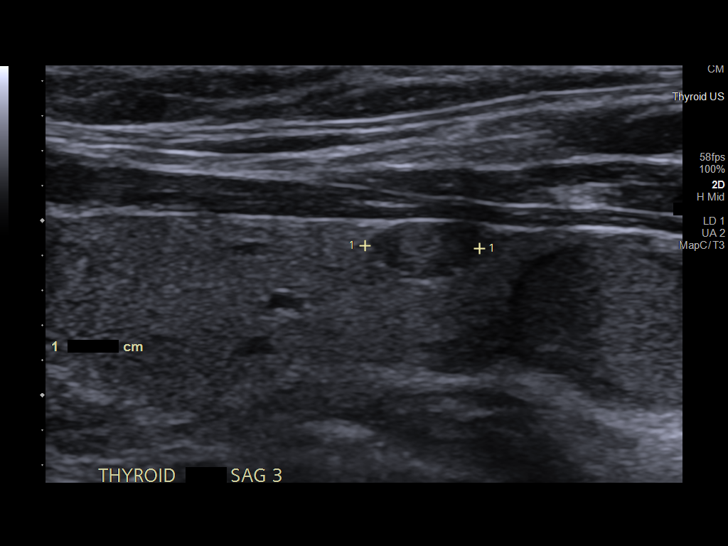
[im 52/63]
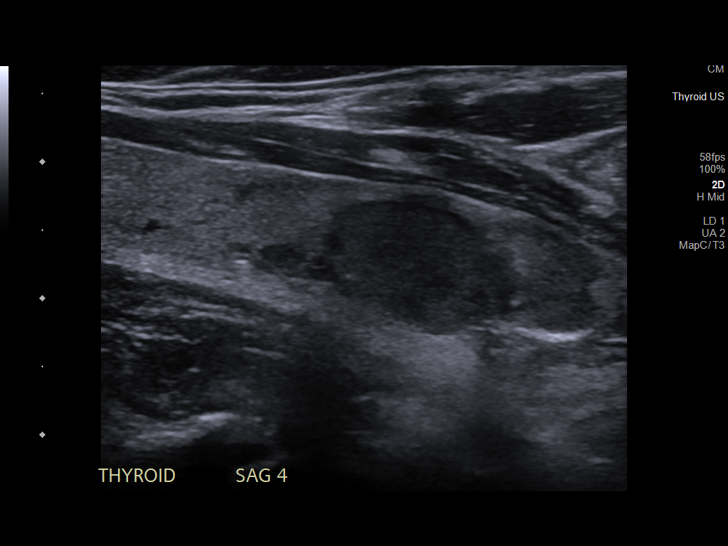
[im 57/63]
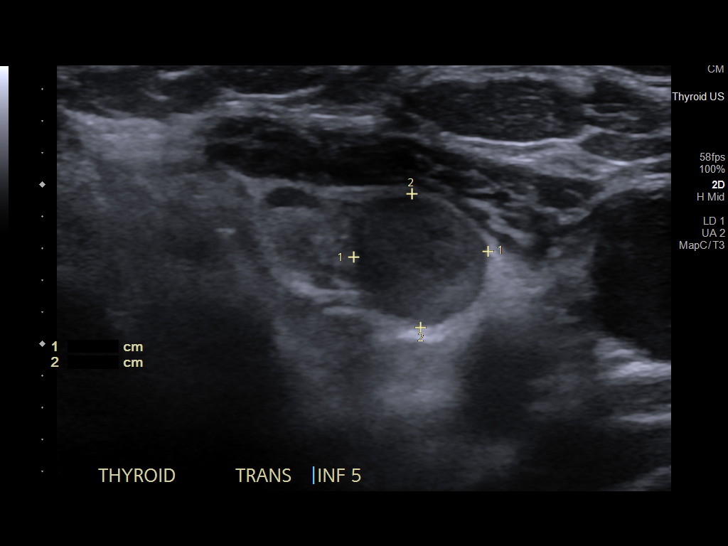
[im 63/63]
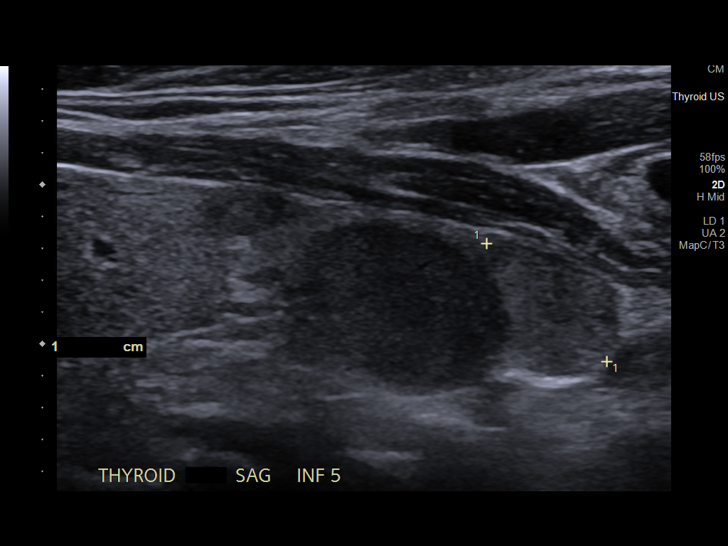

[13 of 25 positions shown; findings below may reference images not displayed]

FINDINGS: Parenchymal Echotexture: Mildly heterogenous

Isthmus: 0.2 cm

Right lobe: 4.3 cm x 1.2 cm x 1.1 cm

Left lobe: 4.4 cm x 1.0 cm x 1.1 cm

_________________________________________________________

Estimated total number of nodules >/= 1 cm: 3

Number of spongiform nodules >/=  2 cm not described below (TR1): 0

Number of mixed cystic and solid nodules >/= 1.5 cm not described
below (TR2): 0

_________________________________________________________

Isthmic nodule labeled 1, TR 3, and does not meet criteria for
surveillance or biopsy.

Prior superior right thyroid nodule (previously 2) not visualized on
the current

Nodule # 6: (Note that this numbering system is a continuation of
the prior ultrasound survey)

Location: Right; Inferior

Maximum size: 1.1 cm; Other 2 dimensions: 1.0 cm x 0.5 cm

Composition: cannot determine (2)

Echogenicity: hypoechoic (2)

Shape: not taller-than-wide (0)

Margins: smooth (0)

Echogenic foci: none (0)

ACR TI-RADS total points: 4.

ACR TI-RADS risk category: TR4 (4-6 points).

ACR TI-RADS recommendations:

Nodule meets criteria for surveillance

_________________________________________________________

Nodule labeled 3, 7 mm, does not meet criteria for further
surveillance or biopsy.

Nodule labeled 4, left inferior thyroid, measures 1.4 cm on the
current study and remains TR 4. Nodule meets criteria for continued
surveillance.

Nodule labeled 5, TR 3, and does not meet criteria for further
surveillance or biopsy.

No adenopathy
IMPRESSION: Multinodular thyroid again demonstrated.

Left inferior thyroid nodule (labeled 4, 1.4 cm, TR 4) and right
inferior thyroid nodule (labeled 6, 1.1 cm, TR 4) both meet criteria
for surveillance, as designated by the newly established ACR TI-RADS
criteria. Surveillance ultrasound study recommended to be performed
annually up to 5 years.

Recommendations follow those established by the new ACR TI-RADS
criteria ([HOSPITAL] 3241;[DATE]).

## 2021-02-06 ENCOUNTER — Other Ambulatory Visit (HOSPITAL_COMMUNITY): Payer: Self-pay | Admitting: Family Medicine

## 2021-02-06 DIAGNOSIS — Z1231 Encounter for screening mammogram for malignant neoplasm of breast: Secondary | ICD-10-CM

## 2021-02-11 ENCOUNTER — Other Ambulatory Visit: Payer: Self-pay | Admitting: Family Medicine

## 2021-02-25 ENCOUNTER — Other Ambulatory Visit: Payer: Self-pay

## 2021-02-25 ENCOUNTER — Ambulatory Visit (INDEPENDENT_AMBULATORY_CARE_PROVIDER_SITE_OTHER): Payer: Medicare PPO | Admitting: Nurse Practitioner

## 2021-02-25 ENCOUNTER — Encounter: Payer: Self-pay | Admitting: Nurse Practitioner

## 2021-02-25 VITALS — BP 122/86 | HR 60 | Temp 97.7°F | Ht 65.0 in | Wt 178.0 lb

## 2021-02-25 DIAGNOSIS — J301 Allergic rhinitis due to pollen: Secondary | ICD-10-CM | POA: Diagnosis not present

## 2021-02-25 DIAGNOSIS — R42 Dizziness and giddiness: Secondary | ICD-10-CM

## 2021-02-25 MED ORDER — MECLIZINE HCL 12.5 MG PO TABS: 12.5000 mg | ORAL_TABLET | Freq: Three times a day (TID) | ORAL | 1 refills | Status: DC | PRN

## 2021-02-25 MED ORDER — FLUTICASONE PROPIONATE 50 MCG/ACT NA SUSP
2.0000 | Freq: Every day | NASAL | 6 refills | Status: AC
Start: 2021-02-25 — End: ?

## 2021-02-25 MED ORDER — CETIRIZINE HCL 10 MG PO TABS: 10.0000 mg | ORAL_TABLET | Freq: Every day | ORAL | 1 refills | Status: DC

## 2021-02-25 NOTE — Progress Notes (Signed)
Subjective:    Patient ID: Debra [name], female    DOB: 1951-03-21, 70 y.o.   MRN: [id number]  HPI: Debra [name] is a 70 y.o. female presenting for ear pain.  Chief Complaint  Patient presents with  . Ear Pain    Left ear pain since this wkn with vertigo   EAR PAIN Started with headache and vertigo, both of which are resolved now.  Having eye surgery next week, wants to ensure no infection as does not want to complicate eye surgery.   Has had vertigo before - this was the same sensation and meclizine had worked well in the past.   Duration: days Involved ear(s): left Severity:  5-6/10  Quality:  throbbing Fever: no ; has been cold/hot Otorrhea: no Upper respiratory infection symptoms: yes Pruritus: no Hearing loss: no Water immersion yes Using Q-tips: no Recurrent otitis media: no Status: worse Treatments attempted: Tylenol, ocean nasal spray   Allergies  Allergen Reactions  . Codeine Nausea And Vomiting  . Morphine And Related Nausea And Vomiting  . Sulfa Antibiotics Hives and Itching    Outpatient Encounter Medications as of 02/25/2021  Medication Sig  . amLODipine (NORVASC) 5 MG tablet TAKE (1) TABLET BY MOUTH ONCE DAILY.  Marland Kitchen buPROPion (WELLBUTRIN XL) 150 MG 24 hr tablet Take 1 tablet (150 mg total) by mouth daily.  Marland Kitchen buPROPion (WELLBUTRIN XL) 300 MG 24 hr tablet TAKE  450mg   BY MOUTH ONCE DAILY.  . cetirizine (ZYRTEC) 10 MG tablet Take 1 tablet (10 mg total) by mouth daily.  Marland Kitchen dicyclomine (BENTYL) 20 MG tablet TAKE 1 TABLET FOUR TIMES DAILY BEFORE MEALS AND AT BEDTIME  . ferrous sulfate 325 (65 FE) MG tablet Take 325 mg by mouth daily with breakfast.  . fluticasone (FLONASE) 50 MCG/ACT nasal spray Place 2 sprays into both nostrils daily.  Marland Kitchen levothyroxine (SYNTHROID) 25 MCG tablet Take 1 tablet (25 mcg total) by mouth daily before breakfast.  . LORazepam (ATIVAN) 0.5 MG tablet TAKE 1 TABLET TWICE DAILY AS NEEDED FOR AGITATION AND/OR ANXIETY.  . meclizine  (ANTIVERT) 12.5 MG tablet Take 1 tablet (12.5 mg total) by mouth 3 (three) times daily as needed for dizziness.  . pravastatin (PRAVACHOL) 40 MG tablet TAKE 1/2 TABLET BY MOUTH ONCE DAILY.  Marland Kitchen propranolol ER (INDERAL LA) 80 MG 24 hr capsule TAKE (1) CAPSULE BY MOUTH AT BEDTIME.   No facility-administered encounter medications on file as of 02/25/2021.    Patient Active Problem List   Diagnosis Date Noted  . Multinodular goiter 08/30/2019  . Subclinical hyperthyroidism 08/30/2019  . Fatty liver 10/21/2016  . Hypothyroidism 06/11/2015  . Muscle ache of extremity 11/27/2014  . Anxiety state 09/23/2014  . Arthritis 07/24/2014  . Depression 06/13/2014  . Osteopenia 02/11/2014  . Pain in joint, multiple sites 02/11/2014  . Infected epidermoid cyst 02/11/2014  . Vaginal atrophy 08/17/2013  . Bloating 02/15/2013  . IBS (irritable bowel syndrome) 10/27/2012  . Hyperlipidemia 01/25/2012  . Hypertension 01/25/2012  . Iron deficiency anemia 01/19/2012    Past Medical History:  Diagnosis Date  . Diverticulitis   . Diverticulosis of colon (without mention of hemorrhage)   . Esophageal reflux   . Generalized anxiety disorder   . Hiatal hernia   . Hypertension   . Iron deficiency anemia 01/19/2012  . Mixed hyperlipidemia   . OA (osteoarthritis)   . PONV (postoperative nausea and vomiting)   . Unspecified hypothyroidism     Relevant past medical, surgical,  family and social history reviewed and updated as indicated. Interim medical history since our last visit reviewed.  Review of Systems  Constitutional: Negative.  Negative for activity change, appetite change, fatigue and fever.  HENT: Positive for ear pain and sinus pressure. Negative for congestion, postnasal drip, rhinorrhea, sinus pain, sneezing and sore throat.   Eyes: Negative.  Negative for photophobia and visual disturbance.  Respiratory: Negative.  Negative for cough, shortness of breath and wheezing.   Cardiovascular:  Negative.  Negative for chest pain and palpitations.  Neurological: Negative for dizziness, weakness and headaches.    Per HPI unless specifically indicated above     Objective:    BP 122/86   Pulse 60   Temp 97.7 F (36.5 C)   Ht 5\' 5"  (1.651 m)   Wt 178 lb (80.7 kg)   SpO2 96%   BMI 29.62 kg/m   Wt Readings from Last 3 Encounters:  02/25/21 178 lb (80.7 kg)  11/14/20 176 lb 8 oz (80.1 kg)  09/23/20 174 lb (78.9 kg)    Physical Exam Vitals and nursing note reviewed.  Constitutional:      General: She is not in acute distress.    Appearance: Normal appearance. She is not toxic-appearing.  HENT:     Head: Normocephalic and atraumatic.     Right Ear: Tympanic membrane, ear canal and external ear normal. There is no impacted cerumen.     Left Ear: Tympanic membrane, ear canal and external ear normal. There is no impacted cerumen.     Nose: Congestion present. No rhinorrhea.     Mouth/Throat:     Mouth: Mucous membranes are moist.     Pharynx: Oropharynx is clear. No oropharyngeal exudate or posterior oropharyngeal erythema.  Eyes:     General: No scleral icterus.    Extraocular Movements: Extraocular movements intact.  Neck:     Vascular: No carotid bruit.  Cardiovascular:     Heart sounds: Normal heart sounds. No murmur heard.   Pulmonary:     Effort: Pulmonary effort is normal. No respiratory distress.     Breath sounds: Normal breath sounds. No wheezing, rhonchi or rales.  Abdominal:     General: Abdomen is flat. Bowel sounds are normal.     Palpations: Abdomen is soft.  Musculoskeletal:     Cervical back: Normal range of motion and neck supple.  Lymphadenopathy:     Cervical: No cervical adenopathy.  Skin:    General: Skin is warm and dry.     Capillary Refill: Capillary refill takes less than 2 seconds.     Coloration: Skin is not jaundiced or pale.     Findings: No erythema.  Neurological:     Mental Status: She is alert and oriented to person, place,  and time.     Motor: No weakness.     Gait: Gait normal.  Psychiatric:        Mood and Affect: Mood normal.        Behavior: Behavior normal.        Thought Content: Thought content normal.        Judgment: Judgment normal.       Assessment & Plan:  1. Vertigo Most recent episode has resolved.  Denies chest pain or shortness of breath associated with the room spinning.  Meclizine has worked well in the past-we will give refill of this medication to have on hand.  Follow-up if meclizine does not work or if there is any chest pain  or shortness of breath associated with the dizziness.  - meclizine (ANTIVERT) 12.5 MG tablet; Take 1 tablet (12.5 mg total) by mouth 3 (three) times daily as needed for dizziness.  Dispense: 30 tablet; Refill: 1  2. Seasonal allergic rhinitis due to pollen Ear pain and other upper respiratory symptoms possibly related to viral versus allergic rhinitis.  Will start on daily Flonase and cetirizine.  She will let us know if symptoms do not continue to improve.  - fluticasone (FLONASE) 50 MCG/ACT nasal spray; Place 2 sprays into both nostrils daily.  Dispense: 16 g; Refill: 6 - cetirizine (ZYRTEC) 10 MG tablet; Take 1 tablet (10 mg total) by mouth daily.  Dispense: 90 tablet; Refill: 1   Follow up plan: Return if symptoms worsen or fail to improve.

## 2021-02-27 ENCOUNTER — Ambulatory Visit (HOSPITAL_COMMUNITY)
Admission: RE | Admit: 2021-02-27 | Discharge: 2021-02-27 | Disposition: A | Discharge status: Home / Self Care | Payer: Medicare PPO | Source: Ambulatory Visit | Attending: Family Medicine | Admitting: Family Medicine

## 2021-02-27 DIAGNOSIS — Z1231 Encounter for screening mammogram for malignant neoplasm of breast: Secondary | ICD-10-CM

## 2021-03-05 DIAGNOSIS — H02835 Dermatochalasis of left lower eyelid: Secondary | ICD-10-CM | POA: Diagnosis not present

## 2021-03-05 DIAGNOSIS — H02831 Dermatochalasis of right upper eyelid: Secondary | ICD-10-CM | POA: Diagnosis not present

## 2021-03-05 DIAGNOSIS — H02832 Dermatochalasis of right lower eyelid: Secondary | ICD-10-CM | POA: Diagnosis not present

## 2021-03-05 DIAGNOSIS — H53483 Generalized contraction of visual field, bilateral: Secondary | ICD-10-CM | POA: Diagnosis not present

## 2021-03-05 DIAGNOSIS — H53453 Other localized visual field defect, bilateral: Secondary | ICD-10-CM | POA: Diagnosis not present

## 2021-03-05 DIAGNOSIS — H57813 Brow ptosis, bilateral: Secondary | ICD-10-CM | POA: Diagnosis not present

## 2021-03-05 DIAGNOSIS — H02834 Dermatochalasis of left upper eyelid: Secondary | ICD-10-CM | POA: Diagnosis not present

## 2021-03-06 ENCOUNTER — Other Ambulatory Visit: Payer: Self-pay | Admitting: Family Medicine

## 2021-03-24 ENCOUNTER — Ambulatory Visit: Payer: Medicare PPO | Admitting: Family Medicine

## 2021-04-25 ENCOUNTER — Telehealth: Payer: Self-pay | Admitting: Family Medicine

## 2021-04-25 NOTE — Telephone Encounter (Signed)
MD accepted patient .   Please schedule.

## 2021-04-25 NOTE — Telephone Encounter (Signed)
Patient's spouse Morgaine Kimball was seen today and spoke with the provider about taking over her care. Please advise.

## 2021-04-25 NOTE — Telephone Encounter (Signed)
Please advise 

## 2021-04-25 NOTE — Telephone Encounter (Signed)
Sure please schedule

## 2021-04-29 ENCOUNTER — Ambulatory Visit (INDEPENDENT_AMBULATORY_CARE_PROVIDER_SITE_OTHER): Payer: Medicare PPO | Admitting: Family Medicine

## 2021-04-29 ENCOUNTER — Other Ambulatory Visit: Payer: Self-pay

## 2021-04-29 ENCOUNTER — Encounter: Payer: Self-pay | Admitting: Family Medicine

## 2021-04-29 VITALS — BP 126/64 | HR 58 | Temp 97.9°F | Resp 14 | Ht 65.0 in | Wt 179.0 lb

## 2021-04-29 DIAGNOSIS — I10 Essential (primary) hypertension: Secondary | ICD-10-CM

## 2021-04-29 DIAGNOSIS — F411 Generalized anxiety disorder: Secondary | ICD-10-CM

## 2021-04-29 DIAGNOSIS — E7800 Pure hypercholesterolemia, unspecified: Secondary | ICD-10-CM

## 2021-04-29 MED ORDER — SCOPOLAMINE 1 MG/3DAYS TD PT72: 1.0000 | MEDICATED_PATCH | TRANSDERMAL | 12 refills | Status: DC

## 2021-04-29 NOTE — Progress Notes (Signed)
Subjective:    Patient ID: Debra [name], female    DOB: August 04, 1951, 70 y.o.   MRN: [id number]  HPI Patient is a very pleasant 70 year old Caucasian female who is here today to reestablish care with me.  She has been seen my partner who has since retired.  Past medical history significant for generalized anxiety disorder.  She typically takes Wellbutrin 300 mg a day.  However she will occasionally take 450 mg.  She states that she takes an extra 150 mg maybe 7 or 8 times a month.  She can typically tell when his go to be a bad day and she sees benefit in doing this.  She denies any family history of seizures or any personal history of seizures.  I am not sure that the as needed dose of Wellbutrin mechanistically would help however the patient sees benefit and therefore I see no reason to change as she feels that the medication is helping her.  She also has a history of hypertension for which she takes propranolol.  It seems as though anxiety may have triggered the hypertension because the beta-blocker seems to do well in controlling her blood pressure and her heart rate.  She denies any palpitations or chest pain or shortness of breath or dyspnea on exertion and her blood pressure today is well controlled at 126/64.  She is on levothyroxine however she sees an endocrinologist every 6 months who checks her TSH.  She is also on pravastatin 20 mg a day for hyperlipidemia.  She denies any myalgias or right upper quadrant pain. Past Medical History:  Diagnosis Date  . Diverticulitis   . Diverticulosis of colon (without mention of hemorrhage)   . Esophageal reflux   . Generalized anxiety disorder   . Hiatal hernia   . Hypertension   . Iron deficiency anemia 01/19/2012  . Mixed hyperlipidemia   . OA (osteoarthritis)   . PONV (postoperative nausea and vomiting)   . Unspecified hypothyroidism    Past Surgical History:  Procedure Laterality Date  . ABDOMINAL HYSTERECTOMY    . APPENDECTOMY    . BOWEL  RESECTION  05/11/2012   Procedure: LOW ANTERIOR BOWEL RESECTION;  Surgeon: Jamesetta So, MD;  Location: [facility];  Service: General;;  . CESAREAN SECTION     First one 37 , second on 1984  . COLON RESECTION  05/11/2012   Procedure: HAND ASSISTED LAPAROSCOPIC COLON RESECTION;  Surgeon: Jamesetta So, MD;  Location: [facility];  Service: General;  Laterality: N/A;  Laparoscopic Hand Assisted Partial Colectomy;  . COLONOSCOPY  2011-2008   Dr. Arnoldo Morale  . COLONOSCOPY  03/10/2012   Procedure: COLONOSCOPY;  Surgeon: Rogene Houston, MD;  Location: [facility];  Service: Endoscopy;  Laterality: N/A;  200  . Hysterctomy  1988  . TONSILLECTOMY     Current Outpatient Medications on File Prior to Visit  Medication Sig Dispense Refill  . amLODipine (NORVASC) 5 MG tablet TAKE (1) TABLET BY MOUTH ONCE DAILY. 90 tablet 0  . buPROPion (WELLBUTRIN XL) 150 MG 24 hr tablet Take 1 tablet (150 mg total) by mouth daily. 90 tablet 1  . buPROPion (WELLBUTRIN XL) 300 MG 24 hr tablet TAKE  450mg   BY MOUTH ONCE DAILY. 90 tablet 3  . cetirizine (ZYRTEC) 10 MG tablet Take 1 tablet (10 mg total) by mouth daily. 90 tablet 1  . dicyclomine (BENTYL) 20 MG tablet TAKE 1 TABLET FOUR TIMES DAILY BEFORE MEALS AND AT BEDTIME 120 tablet 0  .  ferrous sulfate 325 (65 FE) MG tablet Take 325 mg by mouth daily with breakfast.    . fluticasone (FLONASE) 50 MCG/ACT nasal spray Place 2 sprays into both nostrils daily. 16 g 6  . levothyroxine (SYNTHROID) 25 MCG tablet Take 1 tablet (25 mcg total) by mouth daily before breakfast. 90 tablet 1  . LORazepam (ATIVAN) 0.5 MG tablet TAKE 1 TABLET TWICE DAILY AS NEEDED FOR AGITATION AND/OR ANXIETY. 60 tablet 1  . meclizine (ANTIVERT) 12.5 MG tablet Take 1 tablet (12.5 mg total) by mouth 3 (three) times daily as needed for dizziness. 30 tablet 1  . pravastatin (PRAVACHOL) 40 MG tablet TAKE 1/2 TABLET BY MOUTH ONCE DAILY. 45 tablet 0  . propranolol ER (INDERAL LA) 80 MG 24 hr capsule TAKE (1) CAPSULE BY  MOUTH AT BEDTIME. 90 capsule 0   No current facility-administered medications on file prior to visit.   Allergies  Allergen Reactions  . Codeine Nausea And Vomiting  . Morphine And Related Nausea And Vomiting  . Sulfa Antibiotics Hives and Itching   Social History   Socioeconomic History  . Marital status: Married    Spouse name: Not on file  . Number of children: Not on file  . Years of education: Not on file  . Highest education level: Not on file  Occupational History  . Not on file  Tobacco Use  . Smoking status: Never Smoker  . Smokeless tobacco: Never Used  Substance and Sexual Activity  . Alcohol use: No  . Drug use: No  . Sexual activity: Yes    Birth control/protection: Surgical  Other Topics Concern  . Not on file  Social History Narrative  . Not on file   Social Determinants of Health   Financial Resource Strain: Not on file  Food Insecurity: Not on file  Transportation Needs: Not on file  Physical Activity: Not on file  Stress: Not on file  Social Connections: Not on file  Intimate Partner Violence: Not on file      Review of Systems  All other systems reviewed and are negative.      Objective:   Physical Exam Vitals reviewed.  Constitutional:      Appearance: Normal appearance. She is well-developed and normal weight.  Cardiovascular:     Rate and Rhythm: Normal rate and regular rhythm.     Heart sounds: Normal heart sounds. No murmur heard. No friction rub. No gallop.   Pulmonary:     Effort: Pulmonary effort is normal. No respiratory distress.     Breath sounds: Normal breath sounds. No stridor. No wheezing or rhonchi.  Abdominal:     General: Bowel sounds are normal. There is no distension.     Palpations: Abdomen is soft. There is no mass.     Tenderness: There is no abdominal tenderness. There is no guarding or rebound.     Hernia: No hernia is present.  Musculoskeletal:     Right lower leg: No edema.     Left lower leg: No  edema.  Neurological:     General: No focal deficit present.     Mental Status: She is alert and oriented to person, place, and time. Mental status is at baseline.     Cranial Nerves: No cranial nerve deficit.  Psychiatric:        Mood and Affect: Mood normal.        Behavior: Behavior normal.        Thought Content: Thought content normal.  Judgment: Judgment normal.           Assessment & Plan:  Pure hypercholesterolemia - Plan: CBC with Differential/Platelet, COMPLETE METABOLIC PANEL WITH GFR, Lipid panel  Essential hypertension  Anxiety state   Obtain baseline lab work today including CBC, CMP, lipid panel.  Blood pressures well controlled.  Ideally I would like to see her LDL cholesterol below 100.  Check her CBC.  If her hemoglobin is still greater than 13, I would recommend stopping iron to avoid iron toxicity.  Otherwise I will make no changes in her medication at this time

## 2021-04-30 ENCOUNTER — Other Ambulatory Visit: Payer: Self-pay | Admitting: Family Medicine

## 2021-04-30 LAB — CBC WITH DIFFERENTIAL/PLATELET
Absolute Monocytes: 479 cells/uL (ref 200–950)
Basophils Absolute: 51 cells/uL (ref 0–200)
Basophils Relative: 0.9 %
Eosinophils Absolute: 131 cells/uL (ref 15–500)
Eosinophils Relative: 2.3 %
HCT: 44.3 % (ref 35.0–45.0)
Hemoglobin: 14.8 g/dL (ref 11.7–15.5)
Lymphs Abs: 2234 cells/uL (ref 850–3900)
MCH: 31.4 pg (ref 27.0–33.0)
MCHC: 33.4 g/dL (ref 32.0–36.0)
MCV: 94.1 fL (ref 80.0–100.0)
MPV: 10.9 fL (ref 7.5–12.5)
Monocytes Relative: 8.4 %
Neutro Abs: 2804 cells/uL (ref 1500–7800)
Neutrophils Relative %: 49.2 %
Platelets: 228 10*3/uL (ref 140–400)
RBC: 4.71 10*6/uL (ref 3.80–5.10)
RDW: 12.8 % (ref 11.0–15.0)
Total Lymphocyte: 39.2 %
WBC: 5.7 10*3/uL (ref 3.8–10.8)

## 2021-04-30 LAB — COMPLETE METABOLIC PANEL WITH GFR
AG Ratio: 2 (calc) (ref 1.0–2.5)
ALT: 17 U/L (ref 6–29)
AST: 17 U/L (ref 10–35)
Albumin: 4.5 g/dL (ref 3.6–5.1)
Alkaline phosphatase (APISO): 50 U/L (ref 37–153)
BUN/Creatinine Ratio: 23 (calc) — ABNORMAL HIGH (ref 6–22)
BUN: 24 mg/dL (ref 7–25)
CO2: 27 mmol/L (ref 20–32)
Calcium: 9.5 mg/dL (ref 8.6–10.4)
Chloride: 104 mmol/L (ref 98–110)
Creat: 1.06 mg/dL — ABNORMAL HIGH (ref 0.60–0.93)
GFR, Est African American: 62 mL/min/{1.73_m2} (ref >=60)
GFR, Est Non African American: 53 mL/min/{1.73_m2} — ABNORMAL LOW (ref >=60)
Globulin: 2.2 g/dL (calc) (ref 1.9–3.7)
Glucose, Bld: 97 mg/dL (ref 65–99)
Potassium: 4.4 mmol/L (ref 3.5–5.3)
Sodium: 138 mmol/L (ref 135–146)
Total Bilirubin: 0.5 mg/dL (ref 0.2–1.2)
Total Protein: 6.7 g/dL (ref 6.1–8.1)

## 2021-04-30 LAB — TSH: TSH: 4.01 u[IU]/mL (ref 0.450–4.500)

## 2021-04-30 LAB — LIPID PANEL
Cholesterol: 207 mg/dL — ABNORMAL HIGH (ref <200)
HDL: 62 mg/dL (ref >=50)
LDL Cholesterol (Calc): 117 mg/dL (calc) — ABNORMAL HIGH
Non-HDL Cholesterol (Calc): 145 mg/dL (calc) — ABNORMAL HIGH (ref <130)
Total CHOL/HDL Ratio: 3.3 (calc) (ref <5.0)
Triglycerides: 168 mg/dL — ABNORMAL HIGH (ref <150)

## 2021-04-30 LAB — T4, FREE: Free T4: 1.34 ng/dL (ref 0.82–1.77)

## 2021-04-30 NOTE — Telephone Encounter (Signed)
Patient's appt with provider was yesterday. Thank you!

## 2021-05-01 ENCOUNTER — Encounter: Payer: Self-pay | Admitting: *Deleted

## 2021-05-15 ENCOUNTER — Ambulatory Visit: Payer: Medicare PPO | Admitting: "Endocrinology

## 2021-05-21 ENCOUNTER — Other Ambulatory Visit: Payer: Self-pay | Admitting: Family Medicine

## 2021-05-26 ENCOUNTER — Other Ambulatory Visit: Payer: Self-pay

## 2021-05-26 ENCOUNTER — Ambulatory Visit: Payer: Medicare PPO | Admitting: "Endocrinology

## 2021-05-26 ENCOUNTER — Encounter: Payer: Self-pay | Admitting: "Endocrinology

## 2021-05-26 VITALS — BP 130/72 | HR 52 | Ht 65.0 in | Wt 180.8 lb

## 2021-05-26 DIAGNOSIS — E042 Nontoxic multinodular goiter: Secondary | ICD-10-CM | POA: Diagnosis not present

## 2021-05-26 DIAGNOSIS — E782 Mixed hyperlipidemia: Secondary | ICD-10-CM | POA: Diagnosis not present

## 2021-05-26 DIAGNOSIS — E039 Hypothyroidism, unspecified: Secondary | ICD-10-CM | POA: Diagnosis not present

## 2021-05-26 NOTE — Progress Notes (Signed)
05/26/2021, 9:33 AM   Endocrinology follow-up note  Subjective:    Patient ID: Debra [name], female    DOB: 1951/11/11, PCP [name], MD   Past Medical History:  Diagnosis Date   Diverticulitis    Diverticulosis of colon (without mention of hemorrhage)    Esophageal reflux    Generalized anxiety disorder    Hiatal hernia    Hypertension    Iron deficiency anemia 01/19/2012   Mixed hyperlipidemia    OA (osteoarthritis)    PONV (postoperative nausea and vomiting)    Unspecified hypothyroidism    Past Surgical History:  Procedure Laterality Date   ABDOMINAL HYSTERECTOMY     APPENDECTOMY     BOWEL RESECTION  05/11/2012   Procedure: LOW ANTERIOR BOWEL RESECTION;  Surgeon: Jamesetta So, MD;  Location: [facility];  Service: General;;   CESAREAN SECTION     First one 53 , second on [place]  05/11/2012   Procedure: HAND ASSISTED LAPAROSCOPIC COLON RESECTION;  Surgeon: Jamesetta So, MD;  Location: [facility];  Service: General;  Laterality: N/A;  Laparoscopic Hand Assisted Partial Colectomy;   COLONOSCOPY  2011-2008   Dr. Arnoldo Morale   COLONOSCOPY  03/10/2012   Procedure: COLONOSCOPY;  Surgeon: Rogene Houston, MD;  Location: [facility];  Service: Endoscopy;  Laterality: N/A;  200   Hysterctomy  1988   TONSILLECTOMY     Social History   Socioeconomic History   Marital status: Married    Spouse name: Not on file   Number of children: Not on file   Years of education: Not on file   Highest education level: Not on file  Occupational History   Not on file  Tobacco Use   Smoking status: Never   Smokeless tobacco: Never  Substance and Sexual Activity   Alcohol use: No   Drug use: No   Sexual activity: Yes    Birth control/protection: Surgical  Other Topics Concern   Not on file  Social History Narrative   Not on file   Social Determinants of Health   Financial Resource Strain: Not on file  Food  Insecurity: Not on file  Transportation Needs: Not on file  Physical Activity: Not on file  Stress: Not on file  Social Connections: Not on file   Family History  Problem Relation Age of Onset   Rheum arthritis Mother    Heart disease Mother    Healthy Son    Healthy Son    Anesthesia problems Neg Hx    Hypotension Neg Hx    Malignant hyperthermia Neg Hx    Pseudochol deficiency Neg Hx    Outpatient Encounter Medications as of 05/26/2021  Medication Sig   amLODipine (NORVASC) 5 MG tablet TAKE (1) TABLET BY MOUTH ONCE DAILY.   buPROPion (WELLBUTRIN XL) 150 MG 24 hr tablet Take 1 tablet (150 mg total) by mouth daily.   buPROPion (WELLBUTRIN XL) 300 MG 24 hr tablet TAKE  450mg   BY MOUTH ONCE DAILY.   cetirizine (ZYRTEC) 10 MG tablet Take 1 tablet (10 mg total) by mouth daily.   dicyclomine (BENTYL) 20 MG tablet TAKE 1 TABLET FOUR TIMES DAILY BEFORE MEALS AND AT  BEDTIME   ferrous sulfate 325 (65 FE) MG tablet Take 325 mg by mouth daily with breakfast. (Patient not taking: Reported on 05/26/2021)   fluticasone (FLONASE) 50 MCG/ACT nasal spray Place 2 sprays into both nostrils daily.   levothyroxine (SYNTHROID) 25 MCG tablet Take 1 tablet (25 mcg total) by mouth daily before breakfast.   LORazepam (ATIVAN) 0.5 MG tablet TAKE 1 TABLET TWICE DAILY AS NEEDED FOR AGITATION AND/OR ANXIETY.   meclizine (ANTIVERT) 12.5 MG tablet Take 1 tablet (12.5 mg total) by mouth 3 (three) times daily as needed for dizziness.   pravastatin (PRAVACHOL) 40 MG tablet TAKE 1/2 TABLET BY MOUTH ONCE DAILY.   propranolol ER (INDERAL LA) 80 MG 24 hr capsule TAKE (1) CAPSULE BY MOUTH AT BEDTIME.   scopolamine (TRANSDERM-SCOP, 1.5 MG,) 1 MG/3DAYS Place 1 patch (1.5 mg total) onto the skin every 3 (three) days.   No facility-administered encounter medications on file as of 05/26/2021.   ALLERGIES: Allergies  Allergen Reactions   Codeine Nausea And Vomiting   Morphine And Related Nausea And Vomiting   Sulfa  Antibiotics Hives and Itching    VACCINATION STATUS: Immunization History  Administered Date(s) Administered   Fluad Quad(high Dose 65+) 08/18/2019, 09/13/2020   Influenza Split 10/21/2013   Influenza, High Dose Seasonal PF 09/20/2018   Influenza,inj,Quad PF,6+ Mos 08/19/2015   Influenza-Unspecified 09/12/2007   PFIZER(Purple Top)SARS-COV-2 Vaccination 01/12/2020, 02/06/2020, 09/05/2020   Pneumococcal Conjugate-13 08/19/2015   Pneumococcal Polysaccharide-23 02/09/2014   Tdap 05/07/2013   Zoster, Live 07/08/2013    HPI Debra [name] is 70 y.o. female who is returning to follow-up for hypothyroidism, multinodular goiter.   PMD: [name], MD. -She is being treated for hypothyroidism and on observation management for multinodular goiter.   During a prior visit, she was initiated on levothyroxine 25 mcg p.o. daily before breakfast.  She continues to tolerate this medication and her previsit thyroid function tests are consistent with appropriate replacement.    She denies any prior history of thyroid surgery nor ablation.   She reports various forms of thyroid dysfunction in her father members including her mother and 2 aunts.  She denies any family history of thyroid malignancy. She has no new complaints today, however concerned about still high cholesterol levels despite pravastatin 40 mg p.o. nightly.   She admits to dietary indiscretions.   August 25, 2019 thyroid ultrasound reveals multiple nodules in bilateral thyroid lobes, largest one 1.2 cm in the left lobe, nonsuspicious.  Her October 29, 2020 thyroid ultrasound is also unremarkable. -She denies any dysphagia, shortness of breath, nor voice change.  She denies tremors, heat intolerance, palpitations.  Review of Systems Progressive weight loss.  Objective:    BP 130/72   Pulse (!) 52   Ht 5\' 5"  (1.651 m)   Wt 180 lb 12.8 oz (82 kg)   BMI 30.09 kg/m   Wt Readings from Last 3 Encounters:  05/26/21 180 lb  12.8 oz (82 kg)  04/29/21 179 lb (81.2 kg)  02/25/21 178 lb (80.7 kg)    Physical Exam   CMP ( most recent) CMP     Component Value Date/Time   NA 138 04/29/2021 1259   K 4.4 04/29/2021 1259   CL 104 04/29/2021 1259   CO2 27 04/29/2021 1259   GLUCOSE 97 04/29/2021 1259   BUN 24 04/29/2021 1259   CREATININE 1.06 (H) 04/29/2021 1259   CALCIUM 9.5 04/29/2021 1259   PROT 6.7 04/29/2021 1259   ALBUMIN 4.3 07/27/2017  0801   ALBUMIN 4.3 07/27/2017 0801   AST 17 04/29/2021 1259   ALT 17 04/29/2021 1259   ALKPHOS 54 07/27/2017 0801   ALKPHOS 54 07/27/2017 0801   BILITOT 0.5 04/29/2021 1259   GFRNONAA 53 (L) 04/29/2021 1259   GFRAA 62 04/29/2021 1259    Lipid Panel     Component Value Date/Time   CHOL 207 (H) 04/29/2021 1259   TRIG 168 (H) 04/29/2021 1259   HDL 62 04/29/2021 1259   CHOLHDL 3.3 04/29/2021 1259   VLDL 27 07/27/2017 0801   LDLCALC 117 (H) 04/29/2021 1259      Lab Results  Component Value Date   TSH 4.010 04/29/2021   TSH 2.370 11/05/2020   TSH 5.26 (H) 09/23/2020   TSH 3.73 05/21/2020   TSH 5.54 (H) 08/16/2019   TSH 3.97 10/02/2016   TSH 4.06 01/20/2016   TSH 1.281 11/26/2014   TSH 2.616 12/06/2013   FREET4 1.34 04/29/2021   FREET4 1.36 11/05/2020   FREET4 1.2 05/21/2020   FREET4 1.3 08/16/2019   FREET4 1.1 10/02/2016   FREET4 1.3 01/20/2016  August 25, 2019 thyroid ultrasound IMPRESSION: 1. Findings suggestive of multinodular goiter. 2. Nodule #4 which is 1.2 cm meets imaging criteria to recommend a 1 year follow-up as clinically indicated.   October 29, 2020 thyroid ultrasound. Right lobe measures 4.3 cm, left lobe measured 4.4 cm with 1.4 cm left lobe nodule remained stable. IMPRESSION: Multinodular thyroid again demonstrated.   Left inferior thyroid nodule (labeled 4, 1.4 cm, TR 4) and right inferior thyroid nodule (labeled 6, 1.1 cm, TR 4) both meet criteria for surveillance, as designated by the newly established ACR  TI-RADS criteria. Surveillance ultrasound study recommended to be performed annually up to 5 years.  Assessment & Plan:   1. Multinodular goiter 2.  Hypothyroidism 3.  Hyperlipidemia  Her previsit thyroid function tests are consistent with appropriate replacement.  She is advised to continue levothyroxine 25 mcg p.o. daily before breakfast.     - We discussed about the correct intake of her thyroid hormone, on empty stomach at fasting, with water, separated by at least 30 minutes from breakfast and other medications,  and separated by more than 4 hours from calcium, iron, multivitamins, acid reflux medications (PPIs). -Patient is made aware of the fact that thyroid hormone replacement is needed for life, dose to be adjusted by periodic monitoring of thyroid function tests.   - Based on reviews of her previous thyroid ultrasound reports, she has multinodular goiter, stable over time.    -She is not clinically symptomatic of mass-effect nor thyroid dysfunction.  Recommendations by radiology, she may need continued surveillance to document stability for 5 years.  She will be considered for another ultrasound in 1 years.    Regarding her dyslipidemia: She is advised to continue pravastatin 40 mg p.o. nightly.  Detailed plant-based [employer] lifestyle nutrition is discussed and recommended to her.  - she acknowledges that there is a room for improvement in her food and drink choices. - Suggestion is made for her to avoid simple carbohydrates  from her diet including Cakes, Sweet Desserts, Ice Cream, Soda (diet and regular), Sweet Tea, Candies, Chips, Cookies, Store Bought Juices, Alcohol in Excess of  1-2 drinks a day, Artificial Sweeteners,  Coffee Creamer, and "Sugar-free" Products, Lemonade. This will help patient to have more stable blood glucose profile and potentially avoid unintended weight gain.   - I advised her  to maintain close follow up with Pickard,  Cammie Mcgee, MD for primary  care needs.    I spent 25 minutes in the care of the patient today including review of labs from Thyroid Function, CMP, and other relevant labs ; imaging/biopsy records (current and previous including abstractions from other facilities); face-to-face time discussing  her lab results and symptoms, medications doses, her options of short and long term treatment based on the latest standards of care / guidelines;   and documenting the encounter.  Debra [name]  participated in the discussions, expressed understanding, and voiced agreement with the above plans.  All questions were answered to her satisfaction. she is encouraged to contact clinic should she have any questions or concerns prior to her return visit.    Follow up plan: Return in about 6 months (around 11/25/2021) for F/U with Pre-visit Labs.   Glade Lloyd, MD [facility] [address] [place],  [address] Phone: [phone]  Fax: [phone]     05/26/2021, 9:33 AM  This note was partially dictated with voice recognition software. Similar sounding words can be transcribed inadequately or may not  be corrected upon review.

## 2021-05-29 IMAGING — MG MM DIGITAL SCREENING BILAT W/ TOMO AND CAD
6 of 10 series · 6 of 30 positions shown · non-contrast
Comparison: Previous exam(s).

ACR Breast Density Category a: The breast tissue is almost entirely
fatty.

CLINICAL DATA: Screening.

EXAM:
DIGITAL SCREENING BILATERAL MAMMOGRAM WITH TOMOSYNTHESIS AND CAD
TECHNIQUE: Bilateral screening digital craniocaudal and mediolateral oblique
mammograms were obtained. Bilateral screening digital breast
tomosynthesis was performed. The images were evaluated with
computer-aided detection.

[L MLO synth-2D (1 of 2)]
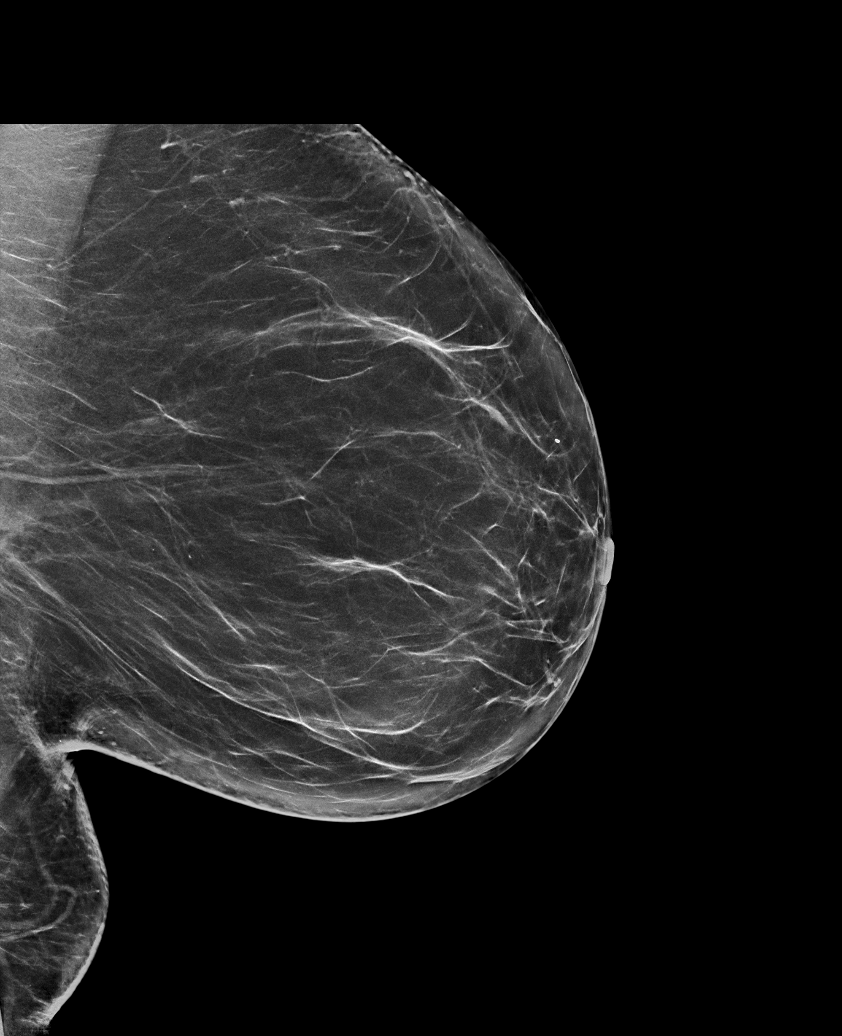

[R CC synth-2D]
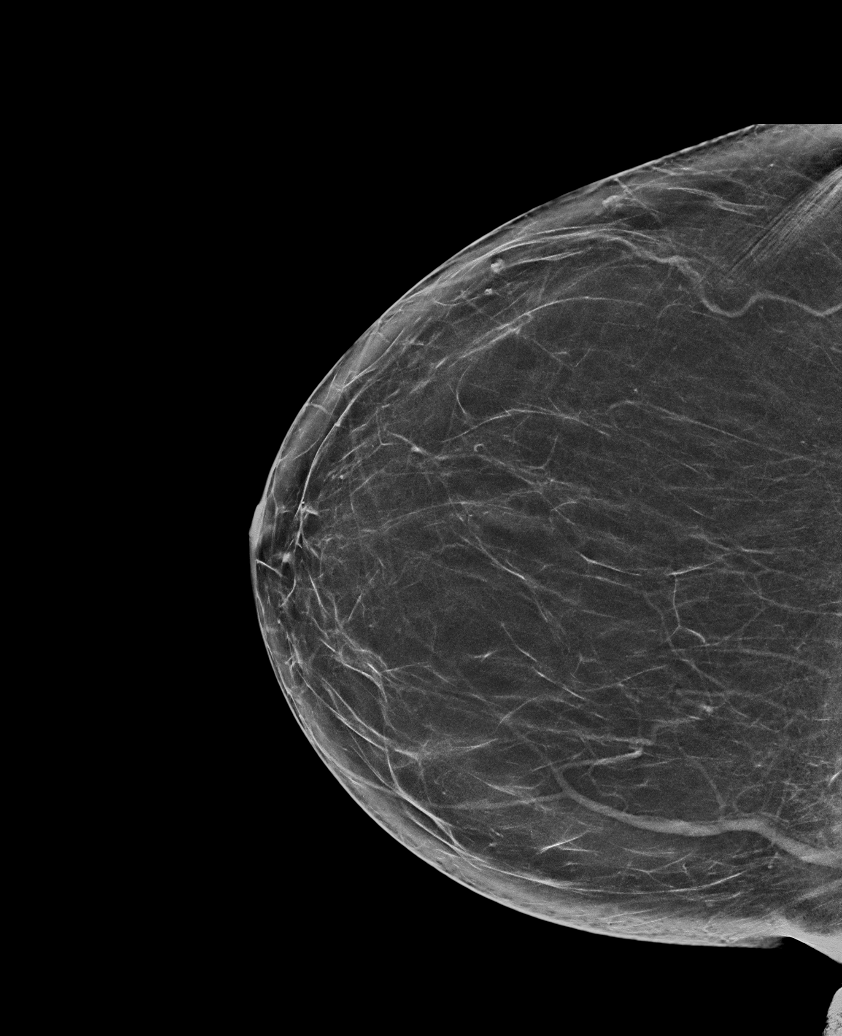

[L MLO synth-2D (2 of 2)]
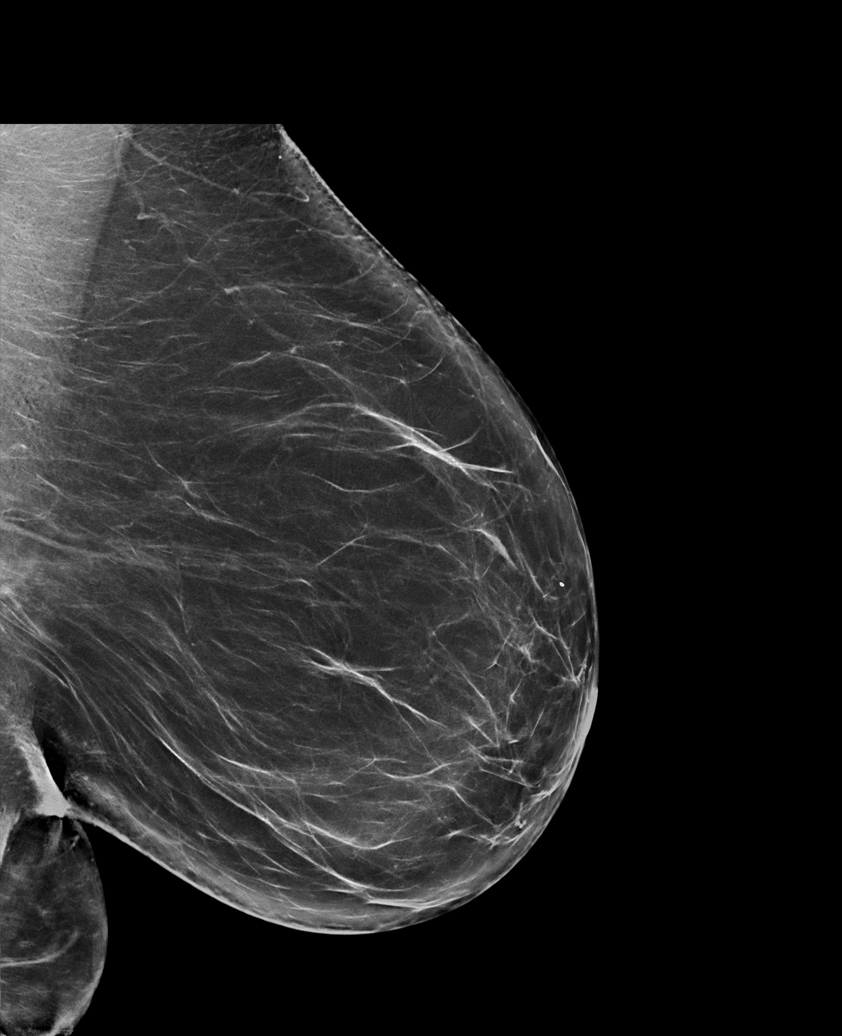

[L CC synth-2D]
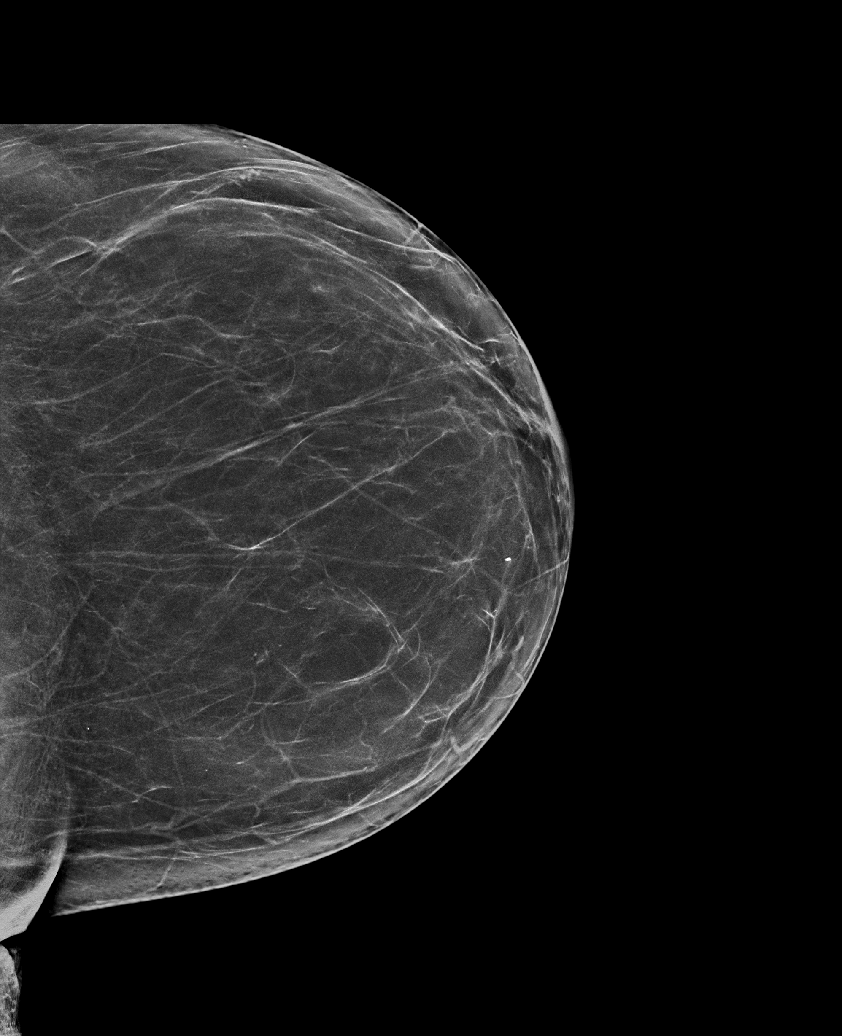

[R MLO synth-2D]
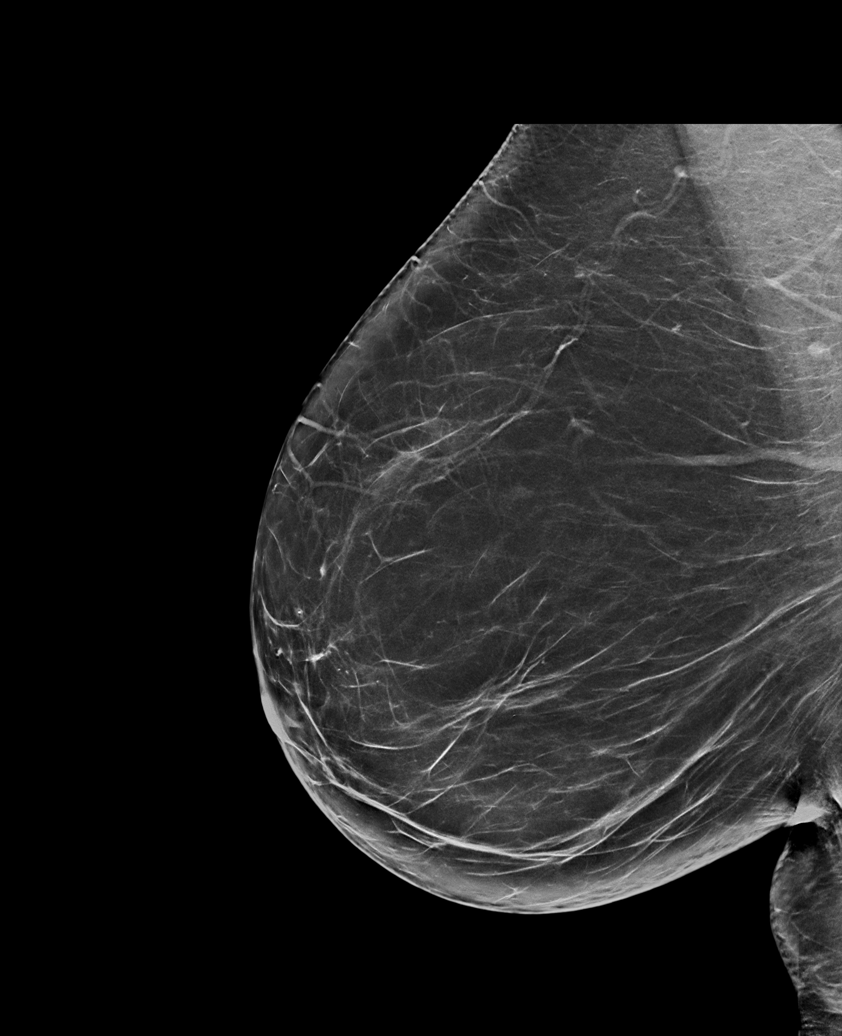

[L CC tomo · tomo slice 36/71.0]
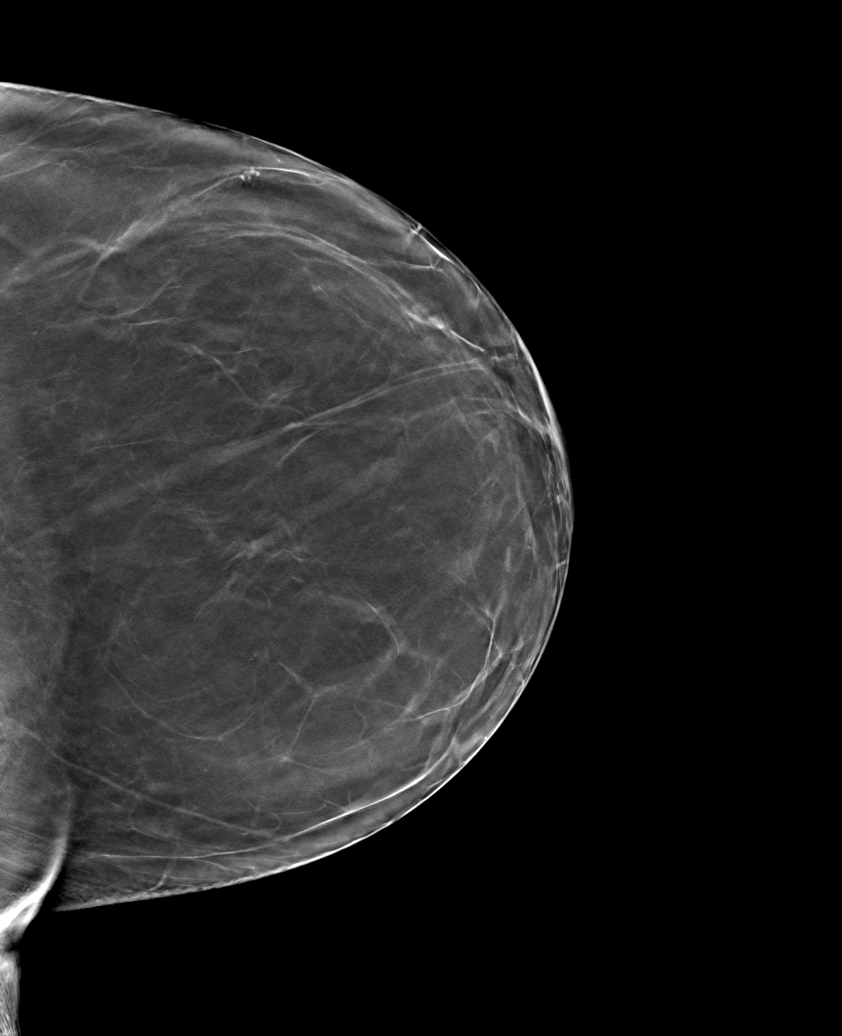

[6 of 30 positions shown; findings below may reference images not displayed]

FINDINGS: There are no findings suspicious for malignancy. The images were
evaluated with computer-aided detection.
IMPRESSION: No mammographic evidence of malignancy. A result letter of this
screening mammogram will be mailed directly to the patient.

RECOMMENDATION:
Screening mammogram in one year. (Code:JP-J-DD5)

BI-RADS CATEGORY  1: Negative.

## 2021-06-11 ENCOUNTER — Other Ambulatory Visit: Payer: Self-pay | Admitting: Family Medicine

## 2021-08-05 ENCOUNTER — Other Ambulatory Visit: Payer: Self-pay | Admitting: Family Medicine

## 2021-08-06 NOTE — Telephone Encounter (Signed)
Ok to refill??  Last office visit 04/29/2021.  Last refill 01/15/2021, #1 refill.

## 2021-08-26 ENCOUNTER — Other Ambulatory Visit: Payer: Self-pay | Admitting: Family Medicine

## 2021-09-09 ENCOUNTER — Other Ambulatory Visit: Payer: Self-pay | Admitting: Family Medicine

## 2021-09-19 DIAGNOSIS — Z23 Encounter for immunization: Secondary | ICD-10-CM | POA: Diagnosis not present

## 2021-09-29 DIAGNOSIS — H25813 Combined forms of age-related cataract, bilateral: Secondary | ICD-10-CM | POA: Diagnosis not present

## 2021-11-03 ENCOUNTER — Other Ambulatory Visit: Payer: Self-pay

## 2021-11-03 ENCOUNTER — Other Ambulatory Visit: Payer: Medicare PPO

## 2021-11-03 DIAGNOSIS — I10 Essential (primary) hypertension: Secondary | ICD-10-CM

## 2021-11-03 DIAGNOSIS — E7800 Pure hypercholesterolemia, unspecified: Secondary | ICD-10-CM

## 2021-11-03 LAB — COMPREHENSIVE METABOLIC PANEL
AG Ratio: 1.7 (calc) (ref 1.0–2.5)
ALT: 16 U/L (ref 6–29)
AST: 15 U/L (ref 10–35)
Albumin: 4.4 g/dL (ref 3.6–5.1)
Alkaline phosphatase (APISO): 59 U/L (ref 37–153)
BUN: 16 mg/dL (ref 7–25)
CO2: 27 mmol/L (ref 20–32)
Calcium: 9.5 mg/dL (ref 8.6–10.4)
Chloride: 105 mmol/L (ref 98–110)
Creat: 1 mg/dL (ref 0.60–1.00)
Globulin: 2.6 g/dL (calc) (ref 1.9–3.7)
Glucose, Bld: 100 mg/dL — ABNORMAL HIGH (ref 65–99)
Potassium: 4.6 mmol/L (ref 3.5–5.3)
Sodium: 139 mmol/L (ref 135–146)
Total Bilirubin: 0.7 mg/dL (ref 0.2–1.2)
Total Protein: 7 g/dL (ref 6.1–8.1)

## 2021-11-03 LAB — CBC WITH DIFFERENTIAL/PLATELET
Absolute Monocytes: 466 cells/uL (ref 200–950)
Basophils Absolute: 39 cells/uL (ref 0–200)
Basophils Relative: 0.8 %
Eosinophils Absolute: 98 cells/uL (ref 15–500)
Eosinophils Relative: 2 %
HCT: 45 % (ref 35.0–45.0)
Hemoglobin: 15.1 g/dL (ref 11.7–15.5)
Lymphs Abs: 2122 cells/uL (ref 850–3900)
MCH: 31 pg (ref 27.0–33.0)
MCHC: 33.6 g/dL (ref 32.0–36.0)
MCV: 92.4 fL (ref 80.0–100.0)
MPV: 11.1 fL (ref 7.5–12.5)
Monocytes Relative: 9.5 %
Neutro Abs: 2176 cells/uL (ref 1500–7800)
Neutrophils Relative %: 44.4 %
Platelets: 236 10*3/uL (ref 140–400)
RBC: 4.87 10*6/uL (ref 3.80–5.10)
RDW: 12.7 % (ref 11.0–15.0)
Total Lymphocyte: 43.3 %
WBC: 4.9 10*3/uL (ref 3.8–10.8)

## 2021-11-03 LAB — LIPID PANEL
Cholesterol: 189 mg/dL (ref <200)
HDL: 56 mg/dL (ref >=50)
LDL Cholesterol (Calc): 110 mg/dL (calc) — ABNORMAL HIGH
Non-HDL Cholesterol (Calc): 133 mg/dL (calc) — ABNORMAL HIGH (ref <130)
Total CHOL/HDL Ratio: 3.4 (calc) (ref <5.0)
Triglycerides: 124 mg/dL (ref <150)

## 2021-11-04 ENCOUNTER — Other Ambulatory Visit: Payer: Self-pay | Admitting: "Endocrinology

## 2021-11-04 DIAGNOSIS — E039 Hypothyroidism, unspecified: Secondary | ICD-10-CM

## 2021-11-06 ENCOUNTER — Other Ambulatory Visit: Payer: Self-pay

## 2021-11-06 ENCOUNTER — Encounter: Payer: Self-pay | Admitting: Family Medicine

## 2021-11-06 ENCOUNTER — Ambulatory Visit (INDEPENDENT_AMBULATORY_CARE_PROVIDER_SITE_OTHER): Payer: Medicare PPO | Admitting: Family Medicine

## 2021-11-06 VITALS — BP 108/78 | HR 51 | Resp 18 | Ht 65.0 in | Wt 180.0 lb

## 2021-11-06 DIAGNOSIS — I10 Essential (primary) hypertension: Secondary | ICD-10-CM | POA: Diagnosis not present

## 2021-11-06 DIAGNOSIS — E7800 Pure hypercholesterolemia, unspecified: Secondary | ICD-10-CM

## 2021-11-06 DIAGNOSIS — E039 Hypothyroidism, unspecified: Secondary | ICD-10-CM

## 2021-11-06 DIAGNOSIS — Z0000 Encounter for general adult medical examination without abnormal findings: Secondary | ICD-10-CM

## 2021-11-06 NOTE — Progress Notes (Signed)
Subjective:    Patient ID: Debra [name], female    DOB: 1951/06/13, 70 y.o.   MRN: [id number]  HPI Patient is a very pleasant 70 year old Caucasian female who is here today for complete physical exam.  Her mammogram was performed in March and was normal.  She has a history of a total abdominal hysterectomy and therefore does not require Pap smears.  Her last bone density test was in 2018 and is due again next year.  She is not taking calcium or vitamin D.  Her last colonoscopy was in 2013 and is due in 2023.  She is due for the shingles vaccine.  Her most recent lab work is listed below Immunization History  Administered Date(s) Administered   Fluad Quad(high Dose 65+) 08/18/2019, 09/13/2020   Influenza Split 10/21/2013   Influenza, High Dose Seasonal PF 09/20/2018   Influenza,inj,Quad PF,6+ Mos 08/19/2015   Influenza-Unspecified 09/12/2007   PFIZER(Purple Top)SARS-COV-2 Vaccination 01/12/2020, 02/06/2020, 09/05/2020   Pneumococcal Conjugate-13 08/19/2015   Pneumococcal Polysaccharide-23 02/09/2014   Tdap 05/07/2013   Zoster, Live 07/08/2013    Past Medical History:  Diagnosis Date   Diverticulitis    Diverticulosis of colon (without mention of hemorrhage)    Esophageal reflux    Generalized anxiety disorder    Hiatal hernia    Hypertension    Iron deficiency anemia 01/19/2012   Mixed hyperlipidemia    OA (osteoarthritis)    PONV (postoperative nausea and vomiting)    Unspecified hypothyroidism    Past Surgical History:  Procedure Laterality Date   ABDOMINAL HYSTERECTOMY     APPENDECTOMY     BOWEL RESECTION  05/11/2012   Procedure: LOW ANTERIOR BOWEL RESECTION;  Surgeon: Jamesetta So, MD;  Location: [facility];  Service: General;;   CESAREAN SECTION     First one 66 , second on [place]  05/11/2012   Procedure: HAND ASSISTED LAPAROSCOPIC COLON RESECTION;  Surgeon: Jamesetta So, MD;  Location: [facility];  Service: General;  Laterality: N/A;  Laparoscopic Hand  Assisted Partial Colectomy;   COLONOSCOPY  2011-2008   Dr. Arnoldo Morale   COLONOSCOPY  03/10/2012   Procedure: COLONOSCOPY;  Surgeon: Rogene Houston, MD;  Location: [facility];  Service: Endoscopy;  Laterality: N/A;  200   Hysterctomy  1988   TONSILLECTOMY     Current Outpatient Medications on File Prior to Visit  Medication Sig Dispense Refill   amLODipine (NORVASC) 5 MG tablet TAKE (1) TABLET BY MOUTH ONCE DAILY. 90 tablet 1   buPROPion (WELLBUTRIN XL) 150 MG 24 hr tablet Take 1 tablet (150 mg total) by mouth daily. 90 tablet 1   buPROPion (WELLBUTRIN XL) 300 MG 24 hr tablet TAKE  450mg   BY MOUTH ONCE DAILY. 90 tablet 3   cetirizine (ZYRTEC) 10 MG tablet Take 1 tablet (10 mg total) by mouth daily. 90 tablet 1   dicyclomine (BENTYL) 20 MG tablet TAKE 1 TABLET FOUR TIMES DAILY BEFORE MEALS AND AT BEDTIME 120 tablet 0   ferrous sulfate 325 (65 FE) MG tablet Take 325 mg by mouth daily with breakfast. (Patient not taking: Reported on 05/26/2021)     fluticasone (FLONASE) 50 MCG/ACT nasal spray Place 2 sprays into both nostrils daily. 16 g 6   levothyroxine (SYNTHROID) 25 MCG tablet TAKE (1) TABLET BY MOUTH ONCE DAILY. 90 tablet 0   LORazepam (ATIVAN) 0.5 MG tablet TAKE 1 TABLET TWICE DAILY AS NEEDED FOR AGITATION AND/OR ANXIETY. 60 tablet 0   meclizine (  ANTIVERT) 12.5 MG tablet Take 1 tablet (12.5 mg total) by mouth 3 (three) times daily as needed for dizziness. 30 tablet 1   pravastatin (PRAVACHOL) 40 MG tablet TAKE 1/2 TABLET BY MOUTH ONCE DAILY. 45 tablet 0   propranolol ER (INDERAL LA) 80 MG 24 hr capsule TAKE (1) CAPSULE BY MOUTH AT BEDTIME. 90 capsule 0   scopolamine (TRANSDERM-SCOP, 1.5 MG,) 1 MG/3DAYS Place 1 patch (1.5 mg total) onto the skin every 3 (three) days. 10 patch 12   No current facility-administered medications on file prior to visit.   Allergies  Allergen Reactions   Codeine Nausea And Vomiting   Morphine And Related Nausea And Vomiting   Sulfa Antibiotics Hives and  Itching   Social History   Socioeconomic History   Marital status: Married    Spouse name: Not on file   Number of children: Not on file   Years of education: Not on file   Highest education level: Not on file  Occupational History   Not on file  Tobacco Use   Smoking status: Never   Smokeless tobacco: Never  Substance and Sexual Activity   Alcohol use: No   Drug use: No   Sexual activity: Yes    Birth control/protection: Surgical  Other Topics Concern   Not on file  Social History Narrative   Not on file   Social Determinants of Health   Financial Resource Strain: Not on file  Food Insecurity: Not on file  Transportation Needs: Not on file  Physical Activity: Not on file  Stress: Not on file  Social Connections: Not on file  Intimate Partner Violence: Not on file      Review of Systems  All other systems reviewed and are negative.     Objective:   Physical Exam Vitals reviewed.  Constitutional:      Appearance: Normal appearance. She is well-developed and normal weight.  Cardiovascular:     Rate and Rhythm: Normal rate and regular rhythm.     Heart sounds: Normal heart sounds. No murmur heard.   No friction rub. No gallop.  Pulmonary:     Effort: Pulmonary effort is normal. No respiratory distress.     Breath sounds: Normal breath sounds. No stridor. No wheezing or rhonchi.  Abdominal:     General: Bowel sounds are normal. There is no distension.     Palpations: Abdomen is soft. There is no mass.     Tenderness: There is no abdominal tenderness. There is no guarding or rebound.     Hernia: No hernia is present.  Musculoskeletal:     Right lower leg: No edema.     Left lower leg: No edema.  Neurological:     General: No focal deficit present.     Mental Status: She is alert and oriented to person, place, and time. Mental status is at baseline.     Cranial Nerves: No cranial nerve deficit.  Psychiatric:        Mood and Affect: Mood normal.         Behavior: Behavior normal.        Thought Content: Thought content normal.        Judgment: Judgment normal.   Appointment on 11/03/2021  Component Date Value Ref Range Status   Glucose, Bld 11/03/2021 100 (H)  65 - 99 mg/dL Final   Comment: .            Fasting reference interval . For someone without known diabetes, a  glucose value between 100 and 125 mg/dL is consistent with prediabetes and should be confirmed with a follow-up test. .    BUN 11/03/2021 16  7 - 25 mg/dL Final   Creat 11/03/2021 1.00  0.60 - 1.00 mg/dL Final   BUN/Creatinine Ratio 26/37/8588 NOT APPLICABLE  6 - 22 (calc) Final   Sodium 11/03/2021 139  135 - 146 mmol/L Final   Potassium 11/03/2021 4.6  3.5 - 5.3 mmol/L Final   Chloride 11/03/2021 105  98 - 110 mmol/L Final   CO2 11/03/2021 27  20 - 32 mmol/L Final   Calcium 11/03/2021 9.5  8.6 - 10.4 mg/dL Final   Total Protein 11/03/2021 7.0  6.1 - 8.1 g/dL Final   Albumin 11/03/2021 4.4  3.6 - 5.1 g/dL Final   Globulin 11/03/2021 2.6  1.9 - 3.7 g/dL (calc) Final   AG Ratio 11/03/2021 1.7  1.0 - 2.5 (calc) Final   Total Bilirubin 11/03/2021 0.7  0.2 - 1.2 mg/dL Final   Alkaline phosphatase (APISO) 11/03/2021 59  37 - 153 U/L Final   AST 11/03/2021 15  10 - 35 U/L Final   ALT 11/03/2021 16  6 - 29 U/L Final   WBC 11/03/2021 4.9  3.8 - 10.8 Thousand/uL Final   RBC 11/03/2021 4.87  3.80 - 5.10 Million/uL Final   Hemoglobin 11/03/2021 15.1  11.7 - 15.5 g/dL Final   HCT 11/03/2021 45.0  35.0 - 45.0 % Final   MCV 11/03/2021 92.4  80.0 - 100.0 fL Final   MCH 11/03/2021 31.0  27.0 - 33.0 pg Final   MCHC 11/03/2021 33.6  32.0 - 36.0 g/dL Final   RDW 11/03/2021 12.7  11.0 - 15.0 % Final   Platelets 11/03/2021 236  140 - 400 Thousand/uL Final   MPV 11/03/2021 11.1  7.5 - 12.5 fL Final   Neutro Abs 11/03/2021 2,176  1,500 - 7,800 cells/uL Final   Lymphs Abs 11/03/2021 2,122  850 - 3,900 cells/uL Final   Absolute Monocytes 11/03/2021 466  200 - 950 cells/uL Final    Eosinophils Absolute 11/03/2021 98  15 - 500 cells/uL Final   Basophils Absolute 11/03/2021 39  0 - 200 cells/uL Final   Neutrophils Relative % 11/03/2021 44.4  % Final   Total Lymphocyte 11/03/2021 43.3  % Final   Monocytes Relative 11/03/2021 9.5  % Final   Eosinophils Relative 11/03/2021 2.0  % Final   Basophils Relative 11/03/2021 0.8  % Final   Cholesterol 11/03/2021 189  <200 mg/dL Final   HDL 11/03/2021 56  > OR = 50 mg/dL Final   Triglycerides 11/03/2021 124  <150 mg/dL Final   LDL Cholesterol (Calc) 11/03/2021 110 (H)  mg/dL (calc) Final   Comment: Reference range: <100 . Desirable range <100 mg/dL for primary prevention;   <70 mg/dL for patients with CHD or diabetic patients  with > or = 2 CHD risk factors. Marland Kitchen LDL-C is now calculated using the Martin-Hopkins  calculation, which is a validated novel method providing  better accuracy than the Friedewald equation in the  estimation of LDL-C.  Cresenciano Genre et al. Annamaria Helling. 5027;741(28): 2061-2068  (http://education.[facility].com/faq/FAQ164)    Total CHOL/HDL Ratio 11/03/2021 3.4  <5.0 (calc) Final   Non-HDL Cholesterol (Calc) 11/03/2021 133 (H)  <130 mg/dL (calc) Final   Comment: For patients with diabetes plus 1 major ASCVD risk  factor, treating to a non-HDL-C goal of <100 mg/dL  (LDL-C of <70 mg/dL) is considered a therapeutic  option.  Assessment & Plan:  Routine general medical examination at a health care facility  Essential hypertension  Acquired hypothyroidism  Pure hypercholesterolemia We discussed switching pravastatin to Crestor to better lower her LDL cholesterol however the patient prefers to stay on pravastatin due to the risk of myalgias.  Recommended Shingrix.  The remainder of her immunizations are up-to-date.  Mammogram is due again in March.  Colonoscopy is due next year.  Bone density is due next year.  She does not require a Pap smear.  Recommended 1200 mg a day of calcium and 1000 units  a day of vitamin D.  She does have slight essential tremor and we discussed primidone but she decided against that.  There is a small nodular lesion on the dorsum of her left (I cannot remember which finger).  Is between the PIP and DIP joints.  It is freely mobile.  I believe is likely a small ganglion cyst.  We will monitor this

## 2021-11-14 DIAGNOSIS — E039 Hypothyroidism, unspecified: Secondary | ICD-10-CM | POA: Diagnosis not present

## 2021-11-14 DIAGNOSIS — E782 Mixed hyperlipidemia: Secondary | ICD-10-CM | POA: Diagnosis not present

## 2021-11-15 LAB — LIPID PANEL
Chol/HDL Ratio: 3.5 ratio (ref 0.0–4.4)
Cholesterol, Total: 192 mg/dL (ref 100–199)
HDL: 55 mg/dL (ref >39)
LDL Chol Calc (NIH): 102 mg/dL — ABNORMAL HIGH (ref 0–99)
Triglycerides: 204 mg/dL — ABNORMAL HIGH (ref 0–149)
VLDL Cholesterol Cal: 35 mg/dL (ref 5–40)

## 2021-11-15 LAB — T4, FREE: Free T4: 1.5 ng/dL (ref 0.82–1.77)

## 2021-11-15 LAB — TSH: TSH: 2.28 u[IU]/mL (ref 0.450–4.500)

## 2021-11-19 ENCOUNTER — Other Ambulatory Visit: Payer: Self-pay | Admitting: Family Medicine

## 2021-11-25 ENCOUNTER — Ambulatory Visit: Payer: Medicare PPO | Admitting: "Endocrinology

## 2021-11-28 ENCOUNTER — Other Ambulatory Visit: Payer: Self-pay | Admitting: Family Medicine

## 2021-11-28 NOTE — Telephone Encounter (Signed)
Per chart pt is on 450mg  QD. Refill request is for 300mg  QD.   Please advise, thanks!

## 2021-12-04 ENCOUNTER — Other Ambulatory Visit: Payer: Self-pay | Admitting: Family Medicine

## 2021-12-23 DIAGNOSIS — H25043 Posterior subcapsular polar age-related cataract, bilateral: Secondary | ICD-10-CM | POA: Diagnosis not present

## 2021-12-23 DIAGNOSIS — H2513 Age-related nuclear cataract, bilateral: Secondary | ICD-10-CM | POA: Diagnosis not present

## 2021-12-23 DIAGNOSIS — H18413 Arcus senilis, bilateral: Secondary | ICD-10-CM | POA: Diagnosis not present

## 2021-12-23 DIAGNOSIS — H25013 Cortical age-related cataract, bilateral: Secondary | ICD-10-CM | POA: Diagnosis not present

## 2021-12-23 DIAGNOSIS — H2511 Age-related nuclear cataract, right eye: Secondary | ICD-10-CM | POA: Diagnosis not present

## 2021-12-24 ENCOUNTER — Encounter: Payer: Self-pay | Admitting: "Endocrinology

## 2021-12-24 ENCOUNTER — Ambulatory Visit: Payer: Medicare PPO | Admitting: "Endocrinology

## 2021-12-24 ENCOUNTER — Other Ambulatory Visit: Payer: Self-pay

## 2021-12-24 VITALS — BP 122/70 | HR 52 | Ht 65.0 in | Wt 178.4 lb

## 2021-12-24 DIAGNOSIS — E042 Nontoxic multinodular goiter: Secondary | ICD-10-CM

## 2021-12-24 DIAGNOSIS — E782 Mixed hyperlipidemia: Secondary | ICD-10-CM | POA: Diagnosis not present

## 2021-12-24 DIAGNOSIS — E039 Hypothyroidism, unspecified: Secondary | ICD-10-CM | POA: Diagnosis not present

## 2021-12-24 NOTE — Progress Notes (Signed)
12/24/2021, 11:22 AM   Endocrinology follow-up note  Subjective:    Patient ID: Debra [name], female    DOB: 25-Mar-1951, PCP [name], MD   Past Medical History:  Diagnosis Date   Diverticulitis    Diverticulosis of colon (without mention of hemorrhage)    Esophageal reflux    Generalized anxiety disorder    Hiatal hernia    Hypertension    Iron deficiency anemia 01/19/2012   Mixed hyperlipidemia    OA (osteoarthritis)    PONV (postoperative nausea and vomiting)    Unspecified hypothyroidism    Past Surgical History:  Procedure Laterality Date   ABDOMINAL HYSTERECTOMY     APPENDECTOMY     BOWEL RESECTION  05/11/2012   Procedure: LOW ANTERIOR BOWEL RESECTION;  Surgeon: Jamesetta So, MD;  Location: [facility];  Service: General;;   CESAREAN SECTION     First one 61 , second on [place]  05/11/2012   Procedure: HAND ASSISTED LAPAROSCOPIC COLON RESECTION;  Surgeon: Jamesetta So, MD;  Location: [facility];  Service: General;  Laterality: N/A;  Laparoscopic Hand Assisted Partial Colectomy;   COLONOSCOPY  2011-2008   Dr. Arnoldo Morale   COLONOSCOPY  03/10/2012   Procedure: COLONOSCOPY;  Surgeon: Rogene Houston, MD;  Location[facility];  Service: Endoscopy;  Laterality: N/A;  200   Hysterctomy  1988   TONSILLECTOMY     Social History   Socioeconomic History   Marital status: Married    Spouse name: Not on file   Number of children: Not on file   Years of education: Not on file   Highest education level: Not on file  Occupational History   Not on file  Tobacco Use   Smoking status: Never   Smokeless tobacco: Never  Vaping Use   Vaping Use: Never used  Substance and Sexual Activity   Alcohol use: No   Drug use: No   Sexual activity: Yes    Birth control/protection: Surgical  Other Topics Concern   Not on file  Social History Narrative   Not on file   Social Determinants of Health    Financial Resource Strain: Not on file  Food Insecurity: Not on file  Transportation Needs: Not on file  Physical Activity: Not on file  Stress: Not on file  Social Connections: Not on file   Family History  Problem Relation Age of Onset   Rheum arthritis Mother    Heart disease Mother    Healthy Son    Healthy Son    Anesthesia problems Neg Hx    Hypotension Neg Hx    Malignant hyperthermia Neg Hx    Pseudochol deficiency Neg Hx    Outpatient Encounter Medications as of 12/24/2021  Medication Sig   Calcium Carbonate-Vit D-Min (CALCIUM 1200 PO) Take 1 tablet by mouth daily in the afternoon.   Cholecalciferol (VITAMIN D3 PO) Take by mouth.   amLODipine (NORVASC) 5 MG tablet TAKE (1) TABLET BY MOUTH ONCE DAILY.   buPROPion (WELLBUTRIN XL) 300 MG 24 hr tablet TAKE ONE TABLET BY MOUTH ONCE DAILY   cetirizine (ZYRTEC) 10 MG tablet Take 1 tablet (10 mg total) by mouth daily.  dicyclomine (BENTYL) 20 MG tablet TAKE 1 TABLET FOUR TIMES DAILY BEFORE MEALS AND AT BEDTIME   EYSUVIS 0.25 % SUSP Apply to eye.   fluticasone (FLONASE) 50 MCG/ACT nasal spray Place 2 sprays into both nostrils daily.   levothyroxine (SYNTHROID) 25 MCG tablet TAKE (1) TABLET BY MOUTH ONCE DAILY.   LORazepam (ATIVAN) 0.5 MG tablet TAKE 1 TABLET TWICE DAILY AS NEEDED FOR AGITATION AND/OR ANXIETY.   meclizine (ANTIVERT) 12.5 MG tablet Take 1 tablet (12.5 mg total) by mouth 3 (three) times daily as needed for dizziness.   pravastatin (PRAVACHOL) 40 MG tablet TAKE 1/2 TABLET BY MOUTH ONCE DAILY.   propranolol ER (INDERAL LA) 80 MG 24 hr capsule TAKE (1) CAPSULE BY MOUTH AT BEDTIME.   scopolamine (TRANSDERM-SCOP, 1.5 MG,) 1 MG/3DAYS Place 1 patch (1.5 mg total) onto the skin every 3 (three) days.   [DISCONTINUED] Multiple Vitamin (MULTIVITAMIN) tablet Take 1 tablet by mouth daily.   No facility-administered encounter medications on file as of 12/24/2021.   ALLERGIES: Allergies  Allergen Reactions   Codeine Nausea  And Vomiting   Morphine And Related Nausea And Vomiting   Sulfa Antibiotics Hives and Itching    VACCINATION STATUS: Immunization History  Administered Date(s) Administered   Fluad Quad(high Dose 65+) 08/18/2019, 09/13/2020   Influenza Split 10/21/2013   Influenza, High Dose Seasonal PF 09/20/2018   Influenza,inj,Quad PF,6+ Mos 08/19/2015   Influenza-Unspecified 09/12/2007, 09/20/2021   PFIZER(Purple Top)SARS-COV-2 Vaccination 01/12/2020, 02/06/2020, 09/05/2020   Pfizer Covid-19 Vaccine Bivalent Booster 65yrs & up 09/20/2021   Pneumococcal Conjugate-13 08/19/2015   Pneumococcal Polysaccharide-23 02/09/2014   Tdap 05/07/2013   Zoster, Live 07/08/2013    HPI Debra [name] is 71 y.o. female who is returning to follow-up for hypothyroidism, multinodular goiter.   PMD: [name], MD. -She is being treated for hypothyroidism and then on observation management for multinodular goiter.    During a prior visit, she was initiated on levothyroxine 25 mcg p.o. daily before breakfast.  She continues to tolerate this medication and her previsit thyroid function tests are consistent with appropriate replacement.      She denies any prior history of thyroid surgery nor ablation.   She reports various forms of thyroid dysfunction in her father members including her mother and 2 aunts.  She denies any family history of thyroid malignancy. She has no new complaints today, however concerned about still high cholesterol levels despite pravastatin 20 mg p.o. nightly.    August 25, 2019 thyroid ultrasound reveals multiple nodules in bilateral thyroid lobes, largest one 1.2 cm in the left lobe, nonsuspicious.  Her October 29, 2020 thyroid ultrasound is also unremarkable. -She denies any dysphagia, shortness of breath, nor voice change.  She denies tremors, heat intolerance, palpitations.  Review of Systems Progressive weight loss.  Objective:    BP 122/70    Pulse (!) 52    Ht 5\' 5"   (1.651 m)    Wt 178 lb 6.4 oz (80.9 kg)    BMI 29.69 kg/m   Wt Readings from Last 3 Encounters:  12/24/21 178 lb 6.4 oz (80.9 kg)  11/06/21 180 lb (81.6 kg)  05/26/21 180 lb 12.8 oz (82 kg)    Physical Exam   CMP ( most recent) CMP     Component Value Date/Time   NA 139 11/03/2021 0823   K 4.6 11/03/2021 0823   CL 105 11/03/2021 0823   CO2 27 11/03/2021 0823   GLUCOSE 100 (H) 11/03/2021 0823   BUN  16 11/03/2021 0823   CREATININE 1.00 11/03/2021 0823   CALCIUM 9.5 11/03/2021 0823   PROT 7.0 11/03/2021 0823   ALBUMIN 4.3 07/27/2017 0801   ALBUMIN 4.3 07/27/2017 0801   AST 15 11/03/2021 0823   ALT 16 11/03/2021 0823   ALKPHOS 54 07/27/2017 0801   ALKPHOS 54 07/27/2017 0801   BILITOT 0.7 11/03/2021 0823   GFRNONAA 53 (L) 04/29/2021 1259   GFRAA 62 04/29/2021 1259    Lipid Panel     Component Value Date/Time   CHOL 192 11/14/2021 1005   TRIG 204 (H) 11/14/2021 1005   HDL 55 11/14/2021 1005   CHOLHDL 3.5 11/14/2021 1005   CHOLHDL 3.4 11/03/2021 0823   VLDL 27 07/27/2017 0801   LDLCALC 102 (H) 11/14/2021 1005   LDLCALC 110 (H) 11/03/2021 0823      Lab Results  Component Value Date   TSH 2.280 11/14/2021   TSH 4.010 04/29/2021   TSH 2.370 11/05/2020   TSH 5.26 (H) 09/23/2020   TSH 3.73 05/21/2020   TSH 5.54 (H) 08/16/2019   TSH 3.97 10/02/2016   TSH 4.06 01/20/2016   TSH 1.281 11/26/2014   TSH 2.616 12/06/2013   FREET4 1.50 11/14/2021   FREET4 1.34 04/29/2021   FREET4 1.36 11/05/2020   FREET4 1.2 05/21/2020   FREET4 1.3 08/16/2019   FREET4 1.1 10/02/2016   FREET4 1.3 01/20/2016  August 25, 2019 thyroid ultrasound IMPRESSION: 1. Findings suggestive of multinodular goiter. 2. Nodule #4 which is 1.2 cm meets imaging criteria to recommend a 1 year follow-up as clinically indicated.   October 29, 2020 thyroid ultrasound. Right lobe measures 4.3 cm, left lobe measured 4.4 cm with 1.4 cm left lobe nodule remained stable. IMPRESSION: Multinodular  thyroid again demonstrated.   Left inferior thyroid nodule (labeled 4, 1.4 cm, TR 4) and right inferior thyroid nodule (labeled 6, 1.1 cm, TR 4) both meet criteria for surveillance, as designated by the newly established ACR TI-RADS criteria. Surveillance ultrasound study recommended to be performed annually up to 5 years.  Assessment & Plan:   1. Multinodular goiter 2.  Hypothyroidism 3.  Hyperlipidemia  Her previsit thyroid function tests are consistent with appropriate replacement.  She is advised to continue levothyroxine 25 mcg p.o. daily before breakfast.     - We discussed about the correct intake of her thyroid hormone, on empty stomach at fasting, with water, separated by at least 30 minutes from breakfast and other medications,  and separated by more than 4 hours from calcium, iron, multivitamins, acid reflux medications (PPIs). -Patient is made aware of the fact that thyroid hormone replacement is needed for life, dose to be adjusted by periodic monitoring of thyroid function tests.    - Based on reviews of her previous thyroid ultrasound reports, she has multinodular goiter, stable over time.    -She is not clinically symptomatic of mass-effect nor thyroid dysfunction.  Recommendations by radiology, she may need continued surveillance to document stability for 5 years.  He will be considered for repeat surveillance thyroid ultrasound after her next visit.     Regarding her dyslipidemia: She is advised to continue pravastatin 20 mg p.o. nightly.  She did not tolerate higher dose.  Her LDL is 102, improving from 117.     - she acknowledges that there is a room for improvement in her food and drink choices. - Suggestion is made for her to avoid simple carbohydrates  from her diet including Cakes, Sweet Desserts, Ice Cream, Soda (diet and  regular), Sweet Tea, Candies, Chips, Cookies, Store Bought Juices, Alcohol in Excess of  1-2 drinks a day, Artificial Sweeteners,  Coffee  Creamer, and "Sugar-free" Products, Lemonade. This will help patient to have more stable blood glucose profile and potentially avoid unintended weight gain.   - I advised her  to maintain close follow up with [name], MD for primary care needs.     I spent 22 minutes in the care of the patient today including review of labs from Thyroid Function, CMP, and other relevant labs ; imaging/biopsy records (current and previous including abstractions from other facilities); face-to-face time discussing  her lab results and symptoms, medications doses, her options of short and long term treatment based on the latest standards of care / guidelines;   and documenting the encounter.  Debra [name]  participated in the discussions, expressed understanding, and voiced agreement with the above plans.  All questions were answered to her satisfaction. she is encouraged to contact clinic should she have any questions or concerns prior to her return visit.     Follow up plan: Return in about 6 months (around 06/23/2022) for F/U with Pre-visit Labs.   Glade Lloyd, MD [facility] [address] [place], [place] [address] Phone: [phone]  Fax: [phone]     12/24/2021, 11:22 AM  This note was partially dictated with voice recognition software. Similar sounding words can be transcribed inadequately or may not  be corrected upon review.

## 2022-01-09 ENCOUNTER — Ambulatory Visit: Payer: Medicare PPO | Admitting: Family Medicine

## 2022-01-09 ENCOUNTER — Other Ambulatory Visit: Payer: Self-pay

## 2022-01-09 VITALS — BP 130/78 | HR 48 | Temp 97.9°F | Resp 16 | Wt 180.0 lb

## 2022-01-09 DIAGNOSIS — D171 Benign lipomatous neoplasm of skin and subcutaneous tissue of trunk: Secondary | ICD-10-CM | POA: Diagnosis not present

## 2022-01-09 NOTE — Progress Notes (Signed)
Subjective:    Patient ID: Debra [name], female    DOB: 1951/07/06, 71 y.o.   MRN: [id number]  HPI Patient has a soft tissue mass on her right posterior shoulder.  Directly over top of her scapula is a soft spongy 4 cm mass that is freely mobile.  Borders are irregular and poorly demarcated.  I feel that the lesion is most likely a lipoma although it could be a deep sebaceous cyst Past Medical History:  Diagnosis Date   Diverticulitis    Diverticulosis of colon (without mention of hemorrhage)    Esophageal reflux    Generalized anxiety disorder    Hiatal hernia    Hypertension    Iron deficiency anemia 01/19/2012   Mixed hyperlipidemia    OA (osteoarthritis)    PONV (postoperative nausea and vomiting)    Unspecified hypothyroidism    Past Surgical History:  Procedure Laterality Date   ABDOMINAL HYSTERECTOMY     APPENDECTOMY     BOWEL RESECTION  05/11/2012   Procedure: LOW ANTERIOR BOWEL RESECTION;  Surgeon: Jamesetta So, MD;  Location: [facility];  Service: General;;   CESAREAN SECTION     First one 16 , second on [place]  05/11/2012   Procedure: HAND ASSISTED LAPAROSCOPIC COLON RESECTION;  Surgeon: Jamesetta So, MD;  Location: [facility];  Service: General;  Laterality: N/A;  Laparoscopic Hand Assisted Partial Colectomy;   COLONOSCOPY  2011-2008   Dr. Arnoldo Morale   COLONOSCOPY  03/10/2012   Procedure: COLONOSCOPY;  Surgeon: Rogene Houston, MD;  Location: [facility];  Service: Endoscopy;  Laterality: N/A;  200   Hysterctomy  1988   TONSILLECTOMY     Current Outpatient Medications on File Prior to Visit  Medication Sig Dispense Refill   amLODipine (NORVASC) 5 MG tablet TAKE (1) TABLET BY MOUTH ONCE DAILY. 90 tablet 1   buPROPion (WELLBUTRIN XL) 300 MG 24 hr tablet TAKE ONE TABLET BY MOUTH ONCE DAILY 90 tablet 0   Calcium Carbonate-Vit D-Min (CALCIUM 1200 PO) Take 1 tablet by mouth daily in the afternoon.     cetirizine (ZYRTEC) 10 MG tablet Take 1 tablet (10 mg  total) by mouth daily. 90 tablet 1   Cholecalciferol (VITAMIN D3 PO) Take by mouth.     dicyclomine (BENTYL) 20 MG tablet TAKE 1 TABLET FOUR TIMES DAILY BEFORE MEALS AND AT BEDTIME 120 tablet 0   EYSUVIS 0.25 % SUSP Apply to eye.     fluticasone (FLONASE) 50 MCG/ACT nasal spray Place 2 sprays into both nostrils daily. 16 g 6   gatifloxacin (ZYMAXID) 0.5 % SOLN Place 1 drop into the right eye 4 (four) times daily.     ketorolac (ACULAR) 0.5 % ophthalmic solution Place 1 drop into the right eye 4 (four) times daily.     levothyroxine (SYNTHROID) 25 MCG tablet TAKE (1) TABLET BY MOUTH ONCE DAILY. 90 tablet 0   LORazepam (ATIVAN) 0.5 MG tablet TAKE 1 TABLET TWICE DAILY AS NEEDED FOR AGITATION AND/OR ANXIETY. 60 tablet 0   meclizine (ANTIVERT) 12.5 MG tablet Take 1 tablet (12.5 mg total) by mouth 3 (three) times daily as needed for dizziness. 30 tablet 1   pravastatin (PRAVACHOL) 40 MG tablet TAKE 1/2 TABLET BY MOUTH ONCE DAILY. 135 tablet 1   prednisoLONE acetate (PRED FORTE) 1 % ophthalmic suspension Place 1 drop into the right eye 4 (four) times daily.     propranolol ER (INDERAL LA) 80 MG 24 hr capsule  TAKE (1) CAPSULE BY MOUTH AT BEDTIME. 90 capsule 0   scopolamine (TRANSDERM-SCOP, 1.5 MG,) 1 MG/3DAYS Place 1 patch (1.5 mg total) onto the skin every 3 (three) days. 10 patch 12   SYSTANE ULTRA 0.4-0.3 % SOLN Apply 1 drop to eye 2 (two) times daily.     No current facility-administered medications on file prior to visit.   Allergies  Allergen Reactions   Codeine Nausea And Vomiting   Morphine And Related Nausea And Vomiting   Sulfa Antibiotics Hives and Itching   Social History   Socioeconomic History   Marital status: Married    Spouse name: Not on file   Number of children: Not on file   Years of education: Not on file   Highest education level: Not on file  Occupational History   Not on file  Tobacco Use   Smoking status: Never   Smokeless tobacco: Never  Vaping Use   Vaping  Use: Never used  Substance and Sexual Activity   Alcohol use: No   Drug use: No   Sexual activity: Yes    Birth control/protection: Surgical  Other Topics Concern   Not on file  Social History Narrative   Not on file   Social Determinants of Health   Financial Resource Strain: Not on file  Food Insecurity: Not on file  Transportation Needs: Not on file  Physical Activity: Not on file  Stress: Not on file  Social Connections: Not on file  Intimate Partner Violence: Not on file      Review of Systems  All other systems reviewed and are negative.     Objective:   Physical Exam Vitals reviewed.  Constitutional:      Appearance: Normal appearance. She is well-developed and normal weight.  Cardiovascular:     Rate and Rhythm: Normal rate and regular rhythm.     Heart sounds: Normal heart sounds. No murmur heard.   No friction rub. No gallop.  Pulmonary:     Effort: Pulmonary effort is normal. No respiratory distress.     Breath sounds: Normal breath sounds. No stridor. No wheezing or rhonchi.    Neurological:     Mental Status: She is alert.          Assessment & Plan:  Lipoma of torso Mass is soft.  There are no concerning features.  I suspect lipoma.  Less likely would be a deep sebaceous cyst.  Patient is comfortable just monitoring the lesion.  Recheck immediately if changing or worsening

## 2022-01-21 ENCOUNTER — Other Ambulatory Visit: Payer: Self-pay | Admitting: Family Medicine

## 2022-01-21 MED ORDER — NIRMATRELVIR/RITONAVIR (PAXLOVID)TABLET: 3.0000 | ORAL_TABLET | Freq: Two times a day (BID) | ORAL | 0 refills | Status: AC

## 2022-02-02 ENCOUNTER — Other Ambulatory Visit (HOSPITAL_COMMUNITY): Payer: Self-pay | Admitting: Family Medicine

## 2022-02-02 DIAGNOSIS — Z1231 Encounter for screening mammogram for malignant neoplasm of breast: Secondary | ICD-10-CM

## 2022-02-06 ENCOUNTER — Other Ambulatory Visit: Payer: Self-pay | Admitting: "Endocrinology

## 2022-02-06 DIAGNOSIS — E039 Hypothyroidism, unspecified: Secondary | ICD-10-CM

## 2022-02-14 ENCOUNTER — Other Ambulatory Visit: Payer: Self-pay | Admitting: Family Medicine

## 2022-02-24 ENCOUNTER — Other Ambulatory Visit: Payer: Self-pay | Admitting: Family Medicine

## 2022-03-02 ENCOUNTER — Encounter (INDEPENDENT_AMBULATORY_CARE_PROVIDER_SITE_OTHER): Payer: Self-pay | Admitting: *Deleted

## 2022-03-02 DIAGNOSIS — H2511 Age-related nuclear cataract, right eye: Secondary | ICD-10-CM | POA: Diagnosis not present

## 2022-03-03 DIAGNOSIS — H2512 Age-related nuclear cataract, left eye: Secondary | ICD-10-CM | POA: Diagnosis not present

## 2022-03-23 DIAGNOSIS — H2512 Age-related nuclear cataract, left eye: Secondary | ICD-10-CM | POA: Diagnosis not present

## 2022-04-03 ENCOUNTER — Ambulatory Visit (HOSPITAL_COMMUNITY): Payer: Medicare PPO

## 2022-04-13 ENCOUNTER — Ambulatory Visit: Payer: Medicare PPO | Admitting: Family Medicine

## 2022-04-13 VITALS — BP 128/72 | HR 51 | Temp 97.9°F | Ht 65.0 in | Wt 181.8 lb

## 2022-04-13 DIAGNOSIS — R052 Subacute cough: Secondary | ICD-10-CM

## 2022-04-13 MED ORDER — LEVOCETIRIZINE DIHYDROCHLORIDE 5 MG PO TABS: 5.0000 mg | ORAL_TABLET | Freq: Every evening | ORAL | 1 refills | Status: DC

## 2022-04-13 NOTE — Progress Notes (Signed)
? ?Subjective:  ? ? Patient ID: Debra [name], female    DOB: [birth year], 71 y.o.   MRN: [id number] ? ?HPI ?Patient states she has had a cough for more than a month.  She denies any wheezing or chills or chest pain or purulent sputum.  She denies any shortness of breath.  She has been suffering from allergies and she does report postnasal drip.  She also reports coughing spells occurring 4-5 times a day.  She has not been taking her allergy medicine consistently.  She denies any acid reflux.  She denies any wheezing or history of asthma ?Past Medical History:  ?Diagnosis Date  ? Diverticulitis   ? Diverticulosis of colon (without mention of hemorrhage)   ? Esophageal reflux   ? Generalized anxiety disorder   ? Hiatal hernia   ? Hypertension   ? Iron deficiency anemia 01/19/2012  ? Mixed hyperlipidemia   ? OA (osteoarthritis)   ? PONV (postoperative nausea and vomiting)   ? Unspecified hypothyroidism   ? ?Past Surgical History:  ?Procedure Laterality Date  ? ABDOMINAL HYSTERECTOMY    ? APPENDECTOMY    ? BOWEL RESECTION  05/11/2012  ? Procedure: LOW ANTERIOR BOWEL RESECTION;  Surgeon: Jamesetta So, MD;  Location: [facility];  Service: General;;  ? CESAREAN SECTION    ? First one 8 , second on 1984  ? COLON RESECTION  05/11/2012  ? Procedure: HAND ASSISTED LAPAROSCOPIC COLON RESECTION;  Surgeon: Jamesetta So, MD;  Location: [facility];  Service: General;  Laterality: N/A;  Laparoscopic Hand Assisted Partial Colectomy;  ? COLONOSCOPY  2011-2008  ? Dr. Arnoldo Morale  ? COLONOSCOPY  03/10/2012  ? Procedure: COLONOSCOPY;  Surgeon: Rogene Houston, MD;  Location: [facility];  Service: Endoscopy;  Laterality: N/A;  200  ? Hysterctomy  1988  ? TONSILLECTOMY    ? ?Current Outpatient Medications on File Prior to Visit  ?Medication Sig Dispense Refill  ? amLODipine (NORVASC) 5 MG tablet TAKE (1) TABLET BY MOUTH ONCE DAILY. 90 tablet 1  ? buPROPion (WELLBUTRIN XL) 300 MG 24 hr tablet Take 1 tablet (300 mg total) by mouth daily. TAKE ONE  TABLET BY MOUTH ONCE DAILY 90 tablet 1  ? Calcium Carbonate-Vit D-Min (CALCIUM 1200 PO) Take 1 tablet by mouth daily in the afternoon.    ? cetirizine (ZYRTEC) 10 MG tablet Take 1 tablet (10 mg total) by mouth daily. 90 tablet 1  ? Cholecalciferol (VITAMIN D3 PO) Take by mouth.    ? dicyclomine (BENTYL) 20 MG tablet TAKE 1 TABLET FOUR TIMES DAILY BEFORE MEALS AND AT BEDTIME 120 tablet 0  ? EYSUVIS 0.25 % SUSP Apply to eye.    ? fluticasone (FLONASE) 50 MCG/ACT nasal spray Place 2 sprays into both nostrils daily. 16 g 6  ? gatifloxacin (ZYMAXID) 0.5 % SOLN Place 1 drop into the right eye 4 (four) times daily.    ? ketorolac (ACULAR) 0.5 % ophthalmic solution Place 1 drop into the right eye 4 (four) times daily.    ? levothyroxine (SYNTHROID) 25 MCG tablet TAKE (1) TABLET BY MOUTH ONCE DAILY. 90 tablet 0  ? LORazepam (ATIVAN) 0.5 MG tablet TAKE 1 TABLET TWICE DAILY AS NEEDED FOR AGITATION AND/OR ANXIETY. 60 tablet 0  ? meclizine (ANTIVERT) 12.5 MG tablet Take 1 tablet (12.5 mg total) by mouth 3 (three) times daily as needed for dizziness. 30 tablet 1  ? pravastatin (PRAVACHOL) 40 MG tablet TAKE 1/2 TABLET BY MOUTH ONCE DAILY. 135 tablet  1  ? prednisoLONE acetate (PRED FORTE) 1 % ophthalmic suspension Place 1 drop into the right eye 4 (four) times daily.    ? propranolol ER (INDERAL LA) 80 MG 24 hr capsule TAKE (1) CAPSULE BY MOUTH AT BEDTIME. 90 capsule 3  ? scopolamine (TRANSDERM-SCOP, 1.5 MG,) 1 MG/3DAYS Place 1 patch (1.5 mg total) onto the skin every 3 (three) days. 10 patch 12  ? SYSTANE ULTRA 0.4-0.3 % SOLN Apply 1 drop to eye 2 (two) times daily.    ? ?No current facility-administered medications on file prior to visit.  ? ?Allergies  ?Allergen Reactions  ? Codeine Nausea And Vomiting  ? Morphine And Related Nausea And Vomiting  ? Sulfa Antibiotics Hives and Itching  ? ?Social History  ? ?Socioeconomic History  ? Marital status: Married  ?  Spouse name: Not on file  ? Number of children: Not on file  ? Years of  education: Not on file  ? Highest education level: Not on file  ?Occupational History  ? Not on file  ?Tobacco Use  ? Smoking status: Never  ? Smokeless tobacco: Never  ?Vaping Use  ? Vaping Use: Never used  ?Substance and Sexual Activity  ? Alcohol use: No  ? Drug use: No  ? Sexual activity: Yes  ?  Birth control/protection: Surgical  ?Other Topics Concern  ? Not on file  ?Social History Narrative  ? Not on file  ? ?Social Determinants of Health  ? ?Financial Resource Strain: Not on file  ?Food Insecurity: Not on file  ?Transportation Needs: Not on file  ?Physical Activity: Not on file  ?Stress: Not on file  ?Social Connections: Not on file  ?Intimate Partner Violence: Not on file  ? ? ? ? ?Review of Systems  ?All other systems reviewed and are negative. ? ?   ?Objective:  ? Physical Exam ?Vitals reviewed.  ?Constitutional:   ?   Appearance: Normal appearance. She is well-developed and normal weight.  ?HENT:  ?   Right Ear: Tympanic membrane and ear canal normal.  ?   Left Ear: Tympanic membrane and ear canal normal.  ?   Nose: Congestion and rhinorrhea present.  ?   Mouth/Throat:  ?   Pharynx: No oropharyngeal exudate or posterior oropharyngeal erythema.  ?Cardiovascular:  ?   Rate and Rhythm: Normal rate and regular rhythm.  ?   Heart sounds: Normal heart sounds. No murmur heard. ?  No friction rub. No gallop.  ?Pulmonary:  ?   Effort: Pulmonary effort is normal. No respiratory distress.  ?   Breath sounds: Normal breath sounds. No stridor. No wheezing or rhonchi.  ?Abdominal:  ?   General: Bowel sounds are normal. There is no distension.  ?   Palpations: Abdomen is soft. There is no mass.  ?   Tenderness: There is no abdominal tenderness. There is no guarding or rebound.  ?   Hernia: No hernia is present.  ?Musculoskeletal:  ?   Right lower leg: No edema.  ?   Left lower leg: No edema.  ?Neurological:  ?   General: No focal deficit present.  ?   Mental Status: She is alert and oriented to person, place, and  time. Mental status is at baseline.  ?   Cranial Nerves: No cranial nerve deficit.  ?Psychiatric:     ?   Mood and Affect: Mood normal.     ?   Behavior: Behavior normal.     ?   Thought Content:  Thought content normal.     ?   Judgment: Judgment normal.  ? ? ? ? ? ?   ?Assessment & Plan:  ?Subacute cough - Plan: DG Chest 2 View ?I believe the patient's cough is due to postnasal drip likely from allergies or an upper respiratory infection.  I recommended the patient add Xyzal to her Flonase and take 5 mg daily to try to stop postnasal drip believe the cough will gradually improve.  If not I would like to get a chest x-ray. ?

## 2022-04-20 ENCOUNTER — Ambulatory Visit (HOSPITAL_COMMUNITY)
Admission: RE | Admit: 2022-04-20 | Discharge: 2022-04-20 | Disposition: A | Discharge status: Home / Self Care | Payer: Medicare PPO | Source: Ambulatory Visit | Attending: Family Medicine | Admitting: Family Medicine

## 2022-04-20 DIAGNOSIS — Z1231 Encounter for screening mammogram for malignant neoplasm of breast: Secondary | ICD-10-CM | POA: Insufficient documentation

## 2022-05-05 ENCOUNTER — Other Ambulatory Visit: Payer: Self-pay | Admitting: Family Medicine

## 2022-05-05 NOTE — Telephone Encounter (Signed)
Requested Prescriptions  Pending Prescriptions Disp Refills  . amLODipine (NORVASC) 5 MG tablet [Pharmacy Med Name: AMLODIPINE BESYLATE 5 MG TAB] 90 tablet 0    Sig: TAKE (1) TABLET BY MOUTH ONCE DAILY.     Cardiovascular: Calcium Channel Blockers 2 Passed - 05/05/2022 10:26 AM      Passed - Last BP in normal range    BP Readings from Last 1 Encounters:  04/13/22 128/72         Passed - Last Heart Rate in normal range    Pulse Readings from Last 1 Encounters:  04/13/22 (!) 51         Passed - Valid encounter within last 6 months    Recent Outpatient Visits          3 weeks ago Subacute cough   [place] [facility], Cammie Mcgee, MD   3 months ago Lipoma of torso   [place] [facility], Cammie Mcgee, MD   6 months ago Routine general medical examination at a health care facility   [place], Cammie Mcgee, MD   1 year ago Pure hypercholesterolemia   [place] [facility], Cammie Mcgee, MD   1 year ago [place] Eulogio Bear, NP

## 2022-05-09 ENCOUNTER — Other Ambulatory Visit: Payer: Self-pay | Admitting: "Endocrinology

## 2022-05-09 DIAGNOSIS — E039 Hypothyroidism, unspecified: Secondary | ICD-10-CM

## 2022-06-01 ENCOUNTER — Other Ambulatory Visit: Payer: Self-pay | Admitting: "Endocrinology

## 2022-06-01 DIAGNOSIS — E782 Mixed hyperlipidemia: Secondary | ICD-10-CM

## 2022-06-01 DIAGNOSIS — E039 Hypothyroidism, unspecified: Secondary | ICD-10-CM

## 2022-06-25 ENCOUNTER — Ambulatory Visit: Payer: Medicare PPO | Admitting: "Endocrinology

## 2022-07-13 DIAGNOSIS — E039 Hypothyroidism, unspecified: Secondary | ICD-10-CM | POA: Diagnosis not present

## 2022-07-13 DIAGNOSIS — E782 Mixed hyperlipidemia: Secondary | ICD-10-CM | POA: Diagnosis not present

## 2022-07-14 ENCOUNTER — Ambulatory Visit: Payer: Medicare PPO | Admitting: Family Medicine

## 2022-07-14 VITALS — BP 120/82 | HR 101 | Temp 97.8°F | Ht 65.0 in | Wt 184.0 lb

## 2022-07-14 DIAGNOSIS — E7800 Pure hypercholesterolemia, unspecified: Secondary | ICD-10-CM

## 2022-07-14 LAB — LIPID PANEL
Chol/HDL Ratio: 3.1 ratio (ref 0.0–4.4)
Cholesterol, Total: 192 mg/dL (ref 100–199)
HDL: 62 mg/dL (ref >39)
LDL Chol Calc (NIH): 114 mg/dL — ABNORMAL HIGH (ref 0–99)
Triglycerides: 90 mg/dL (ref 0–149)
VLDL Cholesterol Cal: 16 mg/dL (ref 5–40)

## 2022-07-14 LAB — T4, FREE: Free T4: 1.38 ng/dL (ref 0.82–1.77)

## 2022-07-14 LAB — TSH: TSH: 3.54 u[IU]/mL (ref 0.450–4.500)

## 2022-07-14 MED ORDER — ROSUVASTATIN CALCIUM 10 MG PO TABS: 10.0000 mg | ORAL_TABLET | Freq: Every day | ORAL | 3 refills | Status: DC

## 2022-07-14 MED ORDER — MELOXICAM 15 MG PO TABS: 15.0000 mg | ORAL_TABLET | Freq: Every day | ORAL | 0 refills | Status: DC

## 2022-07-14 NOTE — Progress Notes (Signed)
Subjective:    Patient ID: Debra [name], female    DOB: Dec 13, 1950, 71 y.o.   MRN: [id number]  HPI Patient has 2 concerns today.  First she has a history of hyperlipidemia.  I reviewed her cholesterol.  Her 10-year risk of cardiovascular disease is 12% based on her current risk factors and her current lab values.  Optimal risk would be 7%.  We discussed increasing pravastatin however she has muscle aches on pravastatin.  She would be willing to try different statin to see if she can tolerate it better.  She also complains of pain in the dorsum of her right midfoot.  She has arthritic bone spurs in that area.  She is been told that she has arthritis.  This is recently been aggravated by playing pickle ball and walking.  She does not want to give up pickleball but she is willing to change her other activities such as avoiding walking and switching to a stationary bicycle.  She is not taking any NSAIDs currently. Past Medical History:  Diagnosis Date   Diverticulitis    Diverticulosis of colon (without mention of hemorrhage)    Esophageal reflux    Generalized anxiety disorder    Hiatal hernia    Hypertension    Iron deficiency anemia 01/19/2012   Mixed hyperlipidemia    OA (osteoarthritis)    PONV (postoperative nausea and vomiting)    Unspecified hypothyroidism    Past Surgical History:  Procedure Laterality Date   ABDOMINAL HYSTERECTOMY     APPENDECTOMY     BOWEL RESECTION  05/11/2012   Procedure: LOW ANTERIOR BOWEL RESECTION;  Surgeon: Jamesetta So, MD;  Location: [facility];  Service: General;;   CESAREAN SECTION     First one 77 , second on [place]  05/11/2012   Procedure: HAND ASSISTED LAPAROSCOPIC COLON RESECTION;  Surgeon: Jamesetta So, MD;  Location: [facility];  Service: General;  Laterality: N/A;  Laparoscopic Hand Assisted Partial Colectomy;   COLONOSCOPY  2011-2008   Dr. Arnoldo Morale   COLONOSCOPY  03/10/2012   Procedure: COLONOSCOPY;  Surgeon: Rogene Houston, MD;   [facility]: [facility];  Service: Endoscopy;  Laterality: N/A;  200   Hysterctomy  1988   TONSILLECTOMY     Current Outpatient Medications on File Prior to Visit  Medication Sig Dispense Refill   amLODipine (NORVASC) 5 MG tablet TAKE (1) TABLET BY MOUTH ONCE DAILY. 90 tablet 0   buPROPion (WELLBUTRIN XL) 300 MG 24 hr tablet Take 1 tablet (300 mg total) by mouth daily. TAKE ONE TABLET BY MOUTH ONCE DAILY 90 tablet 1   Calcium Carbonate-Vit D-Min (CALCIUM 1200 PO) Take 1 tablet by mouth daily in the afternoon.     cetirizine (ZYRTEC) 10 MG tablet Take 1 tablet (10 mg total) by mouth daily. 90 tablet 1   Cholecalciferol (VITAMIN D3 PO) Take by mouth.     dicyclomine (BENTYL) 20 MG tablet TAKE 1 TABLET FOUR TIMES DAILY BEFORE MEALS AND AT BEDTIME 120 tablet 0   EYSUVIS 0.25 % SUSP Apply to eye.     fluticasone (FLONASE) 50 MCG/ACT nasal spray Place 2 sprays into both nostrils daily. 16 g 6   gatifloxacin (ZYMAXID) 0.5 % SOLN Place 1 drop into the right eye 4 (four) times daily.     ketorolac (ACULAR) 0.5 % ophthalmic solution Place 1 drop into the right eye 4 (four) times daily.     levocetirizine (XYZAL ALLERGY 24HR) 5 MG tablet  Take 1 tablet (5 mg total) by mouth every evening. 30 tablet 1   levothyroxine (SYNTHROID) 25 MCG tablet TAKE (1) TABLET BY MOUTH ONCE DAILY. 90 tablet 0   LORazepam (ATIVAN) 0.5 MG tablet TAKE 1 TABLET TWICE DAILY AS NEEDED FOR AGITATION AND/OR ANXIETY. 60 tablet 0   meclizine (ANTIVERT) 12.5 MG tablet Take 1 tablet (12.5 mg total) by mouth 3 (three) times daily as needed for dizziness. 30 tablet 1   pravastatin (PRAVACHOL) 40 MG tablet TAKE 1/2 TABLET BY MOUTH ONCE DAILY. 135 tablet 1   prednisoLONE acetate (PRED FORTE) 1 % ophthalmic suspension Place 1 drop into the right eye 4 (four) times daily.     propranolol ER (INDERAL LA) 80 MG 24 hr capsule TAKE (1) CAPSULE BY MOUTH AT BEDTIME. 90 capsule 3   scopolamine (TRANSDERM-SCOP, 1.5 MG,) 1 MG/3DAYS Place 1 patch (1.5  mg total) onto the skin every 3 (three) days. 10 patch 12   SYSTANE ULTRA 0.4-0.3 % SOLN Apply 1 drop to eye 2 (two) times daily.     No current facility-administered medications on file prior to visit.   Allergies  Allergen Reactions   Codeine Nausea And Vomiting   Morphine And Related Nausea And Vomiting   Sulfa Antibiotics Hives and Itching   Social History   Socioeconomic History   Marital status: Married    Spouse name: Not on file   Number of children: Not on file   Years of education: Not on file   Highest education level: Not on file  Occupational History   Not on file  Tobacco Use   Smoking status: Never   Smokeless tobacco: Never  Vaping Use   Vaping Use: Never used  Substance and Sexual Activity   Alcohol use: No   Drug use: No   Sexual activity: Yes    Birth control/protection: Surgical  Other Topics Concern   Not on file  Social History Narrative   Not on file   Social Determinants of Health   Financial Resource Strain: Not on file  Food Insecurity: Not on file  Transportation Needs: Not on file  Physical Activity: Not on file  Stress: Not on file  Social Connections: Not on file  Intimate Partner Violence: Not on file      Review of Systems  All other systems reviewed and are negative.      Objective:   Physical Exam Vitals reviewed.  Constitutional:      Appearance: Normal appearance. She is well-developed and normal weight.  Cardiovascular:     Rate and Rhythm: Normal rate and regular rhythm.     Heart sounds: Normal heart sounds. No murmur heard.    No friction rub. No gallop.  Pulmonary:     Effort: Pulmonary effort is normal. No respiratory distress.     Breath sounds: Normal breath sounds. No stridor. No wheezing or rhonchi.  Musculoskeletal:     Right lower leg: No edema.     Left lower leg: No edema.  Neurological:     General: No focal deficit present.     Mental Status: She is alert and oriented to person, place, and time.  Mental status is at baseline.     Cranial Nerves: No cranial nerve deficit.           Assessment & Plan:  Pure hypercholesterolemia Discontinue pravastatin and try Crestor 10 mg a day.  If she develop muscle pain she can try co-Q10.  Try meloxicam 15 mg daily as  needed for foot pain.  Recommended discontinuation of walking and switching to a stationary bicycle to avoid repetitive impact.

## 2022-07-21 ENCOUNTER — Encounter: Payer: Self-pay | Admitting: "Endocrinology

## 2022-07-21 ENCOUNTER — Ambulatory Visit: Payer: Medicare PPO | Admitting: "Endocrinology

## 2022-07-21 VITALS — BP 116/64 | HR 52 | Ht 65.0 in | Wt 185.0 lb

## 2022-07-21 DIAGNOSIS — E039 Hypothyroidism, unspecified: Secondary | ICD-10-CM

## 2022-07-21 DIAGNOSIS — E042 Nontoxic multinodular goiter: Secondary | ICD-10-CM | POA: Diagnosis not present

## 2022-07-21 DIAGNOSIS — E782 Mixed hyperlipidemia: Secondary | ICD-10-CM

## 2022-07-21 NOTE — Progress Notes (Signed)
07/21/2022, 11:53 AM   Endocrinology follow-up note  Subjective:    Patient ID: Debra [name], female    DOB: [birth year], PCP Debra Frizzle, MD   Past Medical History:  Diagnosis Date   Diverticulitis    Diverticulosis of colon (without mention of hemorrhage)    Esophageal reflux    Generalized anxiety disorder    Hiatal hernia    Hypertension    Iron deficiency anemia 01/19/2012   Mixed hyperlipidemia    OA (osteoarthritis)    PONV (postoperative nausea and vomiting)    Unspecified hypothyroidism    Past Surgical History:  Procedure Laterality Date   ABDOMINAL HYSTERECTOMY     APPENDECTOMY     BOWEL RESECTION  05/11/2012   Procedure: LOW ANTERIOR BOWEL RESECTION;  Surgeon: Debra So, MD;  Location: [facility];  Service: General;;   CESAREAN SECTION     First one 28 , second on [place]  05/11/2012   Procedure: HAND ASSISTED LAPAROSCOPIC COLON RESECTION;  Surgeon: Debra So, MD;  Location: [facility];  Service: General;  Laterality: N/A;  Laparoscopic Hand Assisted Partial Colectomy;   COLONOSCOPY  2011-2008   Dr. Arnoldo [name]   COLONOSCOPY  03/10/2012   Procedure: COLONOSCOPY;  Surgeon: Debra Houston, MD;  Location: [facility];  Service: Endoscopy;  Laterality: N/A;  200   Hysterctomy  1988   TONSILLECTOMY     Social History   Socioeconomic History   Marital status: Married    Spouse name: Not on file   Number of children: Not on file   Years of education: Not on file   Highest education level: Not on file  Occupational History   Not on file  Tobacco Use   Smoking status: Never   Smokeless tobacco: Never  Vaping Use   Vaping Use: Never used  Substance and Sexual Activity   Alcohol use: No   Drug use: No   Sexual activity: Yes    Birth control/protection: Surgical  Other Topics Concern   Not on file  Social History Narrative   Not on file   Social Determinants of Health    Financial Resource Strain: Not on file  Food Insecurity: Not on file  Transportation Needs: Not on file  Physical Activity: Not on file  Stress: Not on file  Social Connections: Not on file   Family History  Problem Relation Age of Onset   Rheum arthritis Mother    Heart disease Mother    Healthy Son    Healthy Son    Anesthesia problems Neg Hx    Hypotension Neg Hx    Malignant hyperthermia Neg Hx    Pseudochol deficiency Neg Hx    Outpatient Encounter Medications as of 07/21/2022  Medication Sig   Coenzyme Q10 (COQ10 PO) Take 1 tablet by mouth daily.   amLODipine (NORVASC) 5 MG tablet TAKE (1) TABLET BY MOUTH ONCE DAILY.   buPROPion (WELLBUTRIN XL) 300 MG 24 hr tablet Take 1 tablet (300 mg total) by mouth daily. TAKE ONE TABLET BY MOUTH ONCE DAILY   Calcium Carbonate-Vit D-Min (CALCIUM 1200 PO) Take 1 tablet by mouth daily in the afternoon.   cetirizine (ZYRTEC) 10 MG  tablet Take 1 tablet (10 mg total) by mouth daily.   Cholecalciferol (VITAMIN D3 PO) Take by mouth.   dicyclomine (BENTYL) 20 MG tablet TAKE 1 TABLET FOUR TIMES DAILY BEFORE MEALS AND AT BEDTIME   EYSUVIS 0.25 % SUSP Apply to eye.   fluticasone (FLONASE) 50 MCG/ACT nasal spray Place 2 sprays into both nostrils daily.   gatifloxacin (ZYMAXID) 0.5 % SOLN Place 1 drop into the right eye 4 (four) times daily.   ketorolac (ACULAR) 0.5 % ophthalmic solution Place 1 drop into the right eye 4 (four) times daily.   levocetirizine (XYZAL ALLERGY 24HR) 5 MG tablet Take 1 tablet (5 mg total) by mouth every evening.   levothyroxine (SYNTHROID) 25 MCG tablet TAKE (1) TABLET BY MOUTH ONCE DAILY.   LORazepam (ATIVAN) 0.5 MG tablet TAKE 1 TABLET TWICE DAILY AS NEEDED FOR AGITATION AND/OR ANXIETY.   meclizine (ANTIVERT) 12.5 MG tablet Take 1 tablet (12.5 mg total) by mouth 3 (three) times daily as needed for dizziness.   meloxicam (MOBIC) 15 MG tablet Take 1 tablet (15 mg total) by mouth daily.   prednisoLONE acetate (PRED FORTE)  1 % ophthalmic suspension Place 1 drop into the right eye 4 (four) times daily.   propranolol ER (INDERAL LA) 80 MG 24 hr capsule TAKE (1) CAPSULE BY MOUTH AT BEDTIME.   rosuvastatin (CRESTOR) 10 MG tablet Take 1 tablet (10 mg total) by mouth daily. Discontinue pravastatin   scopolamine (TRANSDERM-SCOP, 1.5 MG,) 1 MG/3DAYS Place 1 patch (1.5 mg total) onto the skin every 3 (three) days.   SYSTANE ULTRA 0.4-0.3 % SOLN Apply 1 drop to eye 2 (two) times daily.   No facility-administered encounter medications on file as of 07/21/2022.   ALLERGIES: Allergies  Allergen Reactions   Codeine Nausea And Vomiting   Morphine And Related Nausea And Vomiting   Sulfa Antibiotics Hives and Itching    VACCINATION STATUS: Immunization History  Administered Date(s) Administered   Fluad Quad(high Dose 65+) 08/18/2019, 09/13/2020   Influenza Split 10/21/2013   Influenza, High Dose Seasonal PF 09/20/2018   Influenza,inj,Quad PF,6+ Mos 08/19/2015   Influenza-Unspecified 09/12/2007, 09/20/2021   PFIZER(Purple Top)SARS-COV-2 Vaccination 01/12/2020, 02/06/2020, 09/05/2020   Pfizer Covid-19 Vaccine Bivalent Booster 75yr & up 09/20/2021   Pneumococcal Conjugate-13 08/19/2015   Pneumococcal Polysaccharide-23 02/09/2014   Tdap 05/07/2013   Zoster, Live 07/08/2013    HPI Debra HALBERGis 71y.o. female who is returning to follow-up for hypothyroidism, multinodular goiter.   PMD: PSusy Frizzle MD. -She is being treated for hypothyroidism and then on observation management for multinodular goiter.    During a prior visit, she was initiated on levothyroxine 25 mcg p.o. daily before breakfast.  She continues to tolerate this medication and her previsit thyroid function tests are consistent with appropriate replacement.     She denies any prior history of thyroid surgery nor ablation.   She reports various forms of thyroid dysfunction in her father members including her mother and 2 aunts.  She denies any  family history of thyroid malignancy. She has no new complaints today, her pravastatin was switched to Crestor 10 mg by her PMD due to rising LDL to 113.     August 25, 2019 thyroid ultrasound reveals multiple nodules in bilateral thyroid lobes, largest one 1.2 cm in the left lobe, nonsuspicious.  Her October 29, 2020 thyroid ultrasound is also unremarkable. -She denies any dysphagia, shortness of breath, nor voice change.  She denies tremors, heat intolerance, palpitations.  Review  of Systems Progressive weight loss.  Objective:    BP 116/64   Pulse (!) 52   Ht '5\' 5"'$  (1.651 m)   Wt 185 lb (83.9 kg)   BMI 30.79 kg/m   Wt Readings from Last 3 Encounters:  07/21/22 185 lb (83.9 kg)  07/14/22 184 lb (83.5 kg)  04/13/22 181 lb 12.8 oz (82.5 kg)    Physical Exam   CMP ( most recent) CMP     Component Value Date/Time   NA 139 11/03/2021 0823   K 4.6 11/03/2021 0823   CL 105 11/03/2021 0823   CO2 27 11/03/2021 0823   GLUCOSE 100 (H) 11/03/2021 0823   BUN 16 11/03/2021 0823   CREATININE 1.00 11/03/2021 0823   CALCIUM 9.5 11/03/2021 0823   PROT 7.0 11/03/2021 0823   ALBUMIN 4.3 07/27/2017 0801   ALBUMIN 4.3 07/27/2017 0801   AST 15 11/03/2021 0823   ALT 16 11/03/2021 0823   ALKPHOS 54 07/27/2017 0801   ALKPHOS 54 07/27/2017 0801   BILITOT 0.7 11/03/2021 0823   GFRNONAA 53 (L) 04/29/2021 1259   GFRAA 62 04/29/2021 1259    Lipid Panel     Component Value Date/Time   CHOL 192 07/13/2022 0814   TRIG 90 07/13/2022 0814   HDL 62 07/13/2022 0814   CHOLHDL 3.1 07/13/2022 0814   CHOLHDL 3.4 11/03/2021 0823   VLDL 27 07/27/2017 0801   LDLCALC 114 (H) 07/13/2022 0814   LDLCALC 110 (H) 11/03/2021 0823      Lab Results  Component Value Date   TSH 3.540 07/13/2022   TSH 2.280 11/14/2021   TSH 4.010 04/29/2021   TSH 2.370 11/05/2020   TSH 5.26 (H) 09/23/2020   TSH 3.73 05/21/2020   TSH 5.54 (H) 08/16/2019   TSH 3.97 10/02/2016   TSH 4.06 01/20/2016   TSH 1.281  11/26/2014   FREET4 1.38 07/13/2022   FREET4 1.50 11/14/2021   FREET4 1.34 04/29/2021   FREET4 1.36 11/05/2020   FREET4 1.2 05/21/2020   FREET4 1.3 08/16/2019   FREET4 1.1 10/02/2016   FREET4 1.3 01/20/2016  August 25, 2019 thyroid ultrasound IMPRESSION: 1. Findings suggestive of multinodular goiter. 2. Nodule #4 which is 1.2 cm meets imaging criteria to recommend a 1 year follow-up as clinically indicated.   October 29, 2020 thyroid ultrasound. Right lobe measures 4.3 cm, left lobe measured 4.4 cm with 1.4 cm left lobe nodule remained stable. IMPRESSION: Multinodular thyroid again demonstrated.   Left inferior thyroid nodule (labeled 4, 1.4 cm, TR 4) and right inferior thyroid nodule (labeled 6, 1.1 cm, TR 4) both meet criteria for surveillance, as designated by the newly established ACR TI-RADS criteria. Surveillance ultrasound study recommended to be performed annually up to 5 years.  Assessment & Plan:   1. Multinodular goiter 2.  Hypothyroidism 3.  Hyperlipidemia  Her previsit thyroid function tests are consistent with appropriate replacement.  She is advised to continue levothyroxine 25 mcg p.o. daily before breakfast.     - We discussed about the correct intake of her thyroid hormone, on empty stomach at fasting, with water, separated by at least 30 minutes from breakfast and other medications,  and separated by more than 4 hours from calcium, iron, multivitamins, acid reflux medications (PPIs). -Patient is made aware of the fact that thyroid hormone replacement is needed for life, dose to be adjusted by periodic monitoring of thyroid function tests.  - Based on reviews of her previous thyroid ultrasound reports, she has multinodular goiter, stable  over time.  Be considered for surveillance/repeat thyroid ultrasound before her next visit.  -She is not clinically symptomatic of mass-effect at this time.     Regarding her dyslipidemia: She is advised to continue  Crestor 10 mg p.o. nightly.  This patient will benefit from whole food plant-based diet which is discussed and recommended to her.  Most recent labs show LDL of 114 increasing from 120 her last measurement.   - I advised her  to maintain close follow up with Debra Frizzle, MD for primary care needs.   I spent 23 minutes in the care of the patient today including review of labs from Thyroid Function, CMP, and other relevant labs ; imaging/biopsy records (current and previous including abstractions from other facilities); face-to-face time discussing  her lab results and symptoms, medications doses, her options of short and long term treatment based on the latest standards of care / guidelines;   and documenting the encounter.  Debra [name]  participated in the discussions, expressed understanding, and voiced agreement with the above plans.  All questions were answered to her satisfaction. she is encouraged to contact clinic should she have any questions or concerns prior to her return visit.     Follow up plan: Return in about 6 months (around 01/21/2023) for F/U with Pre-visit Labs, Thyroid / Neck Ultrasound.   Glade Lloyd, MD [facility] [address] [place], [place] [address] Phone: [phone]  Fax: [phone]     07/21/2022, 11:53 AM  This note was partially dictated with voice recognition software. Similar sounding words can be transcribed inadequately or may not  be corrected upon review.

## 2022-08-18 ENCOUNTER — Other Ambulatory Visit: Payer: Self-pay | Admitting: "Endocrinology

## 2022-08-18 DIAGNOSIS — E039 Hypothyroidism, unspecified: Secondary | ICD-10-CM

## 2022-08-27 DIAGNOSIS — J019 Acute sinusitis, unspecified: Secondary | ICD-10-CM | POA: Diagnosis not present

## 2022-08-27 DIAGNOSIS — H6121 Impacted cerumen, right ear: Secondary | ICD-10-CM | POA: Diagnosis not present

## 2022-09-03 ENCOUNTER — Other Ambulatory Visit: Payer: Self-pay | Admitting: Family Medicine

## 2022-09-03 NOTE — Telephone Encounter (Signed)
Requested Prescriptions  Pending Prescriptions Disp Refills  . buPROPion (WELLBUTRIN XL) 300 MG 24 hr tablet [Pharmacy Med Name: BUPROPION HCL XL 300 MG TABLET] 90 tablet 0    Sig: TAKE ONE TABLET BY MOUTH ONCE DAILY     Psychiatry: Antidepressants - bupropion Passed - 09/03/2022 10:47 AM      Passed - Cr in normal range and within 360 days    Creat  Date Value Ref Range Status  11/03/2021 1.00 0.60 - 1.00 mg/dL Final         Passed - AST in normal range and within 360 days    AST  Date Value Ref Range Status  11/03/2021 15 10 - 35 U/L Final         Passed - ALT in normal range and within 360 days    ALT  Date Value Ref Range Status  11/03/2021 16 6 - 29 U/L Final         Passed - Completed PHQ-2 or PHQ-9 in the last 360 days      Passed - Last BP in normal range    BP Readings from Last 1 Encounters:  07/21/22 116/64         Passed - Valid encounter within last 6 months    Recent Outpatient Visits          4 months ago Subacute cough   [place] [name], MD   7 months ago Lipoma of torso   [place] [facility]enn[place] Schaumann, Cammie Mcgee, MD   10 months ago Routine general medical examination at a health care facility   [place], Cammie Mcgee, MD   1 year ago Pure hypercholesterolemia   [place] Dennard Schaumann, Cammie Mcgee, MD   1 year ago [place] Eulogio Bear, NP

## 2022-10-18 ENCOUNTER — Encounter (INDEPENDENT_AMBULATORY_CARE_PROVIDER_SITE_OTHER): Payer: Self-pay | Admitting: Gastroenterology

## 2022-10-22 ENCOUNTER — Other Ambulatory Visit: Payer: Self-pay | Admitting: Family Medicine

## 2022-10-22 NOTE — Telephone Encounter (Signed)
Follow up appointment scheduled for 11/13/2022 for med refills.

## 2022-10-22 NOTE — Telephone Encounter (Signed)
PT NEED 6 month APPT W/PCP FOR FUTURE REFILLS

## 2022-10-28 DIAGNOSIS — H04123 Dry eye syndrome of bilateral lacrimal glands: Secondary | ICD-10-CM | POA: Diagnosis not present

## 2022-11-10 ENCOUNTER — Ambulatory Visit (INDEPENDENT_AMBULATORY_CARE_PROVIDER_SITE_OTHER): Payer: Medicare PPO

## 2022-11-10 VITALS — Ht 65.0 in | Wt 185.0 lb

## 2022-11-10 DIAGNOSIS — Z0000 Encounter for general adult medical examination without abnormal findings: Secondary | ICD-10-CM | POA: Diagnosis not present

## 2022-11-10 NOTE — Progress Notes (Signed)
Subjective:   Debra [name] is a 71 y.o. female who presents for Medicare Annual (Subsequent) preventive examination.  I connected with  [name] on 11/10/22 by a audio enabled telemedicine application and verified that I am speaking with the correct person using two identifiers.  Patient Location: Home  Provider Location: Office/Clinic  I discussed the limitations of evaluation and management by telemedicine. The patient expressed understanding and agreed to proceed.  Review of Systems     Cardiac Risk Factors include: advanced age (>68mn, >>39women);dyslipidemia;hypertension     Objective:    Today's Vitals   11/10/22 1157  Weight: 185 lb (83.9 kg)  Height: '5\' 5"'$  (1.651 m)   Body mass index is 30.79 kg/m.     11/10/2022   12:57 PM 09/23/2020   10:39 AM 10/17/2014    8:09 AM 05/11/2012    1:30 PM 05/06/2012    2:52 PM 03/10/2012    1:12 PM 03/10/2012    1:07 PM  Advanced Directives  Does Patient Have a Medical Advance Directive? Yes Yes No;Yes Patient has advance directive, copy not in chart Patient has advance directive, copy not in chart Patient does not have advance directive Patient does not have advance directive  Type of Advance Directive [place]   Healthcare Power of [place]   Does patient want to make changes to medical advance directive? No - Patient declined  Yes - information given      Copy of [place]in Chart? No - copy requested   Copy requested from family Copy requested from family    Pre-existing out of facility DNR order (yellow form or pink MOST form)    No No  No    Current Medications (verified) Outpatient Encounter Medications as of 11/10/2022  Medication Sig   amLODipine (NORVASC) 5 MG tablet TAKE (1) TABLET BY MOUTH ONCE DAILY.   buPROPion (WELLBUTRIN XL) 300 MG 24 hr tablet TAKE ONE TABLET BY MOUTH ONCE DAILY   Calcium Carbonate-Vit D-Min (CALCIUM 1200 PO)  Take 1 tablet by mouth daily in the afternoon.   cetirizine (ZYRTEC) 10 MG tablet Take 1 tablet (10 mg total) by mouth daily.   Cholecalciferol (VITAMIN D3 PO) Take by mouth.   Coenzyme Q10 (COQ10 PO) Take 1 tablet by mouth daily.   dicyclomine (BENTYL) 20 MG tablet TAKE 1 TABLET FOUR TIMES DAILY BEFORE MEALS AND AT BEDTIME   EYSUVIS 0.25 % SUSP Apply to eye.   fluticasone (FLONASE) 50 MCG/ACT nasal spray Place 2 sprays into both nostrils daily.   gatifloxacin (ZYMAXID) 0.5 % SOLN Place 1 drop into the right eye 4 (four) times daily.   ketorolac (ACULAR) 0.5 % ophthalmic solution Place 1 drop into the right eye 4 (four) times daily.   levocetirizine (XYZAL ALLERGY 24HR) 5 MG tablet Take 1 tablet (5 mg total) by mouth every evening.   levothyroxine (SYNTHROID) 25 MCG tablet TAKE (1) TABLET BY MOUTH ONCE DAILY.   LORazepam (ATIVAN) 0.5 MG tablet TAKE 1 TABLET TWICE DAILY AS NEEDED FOR AGITATION AND/OR ANXIETY.   meclizine (ANTIVERT) 12.5 MG tablet Take 1 tablet (12.5 mg total) by mouth 3 (three) times daily as needed for dizziness.   meloxicam (MOBIC) 15 MG tablet Take 1 tablet (15 mg total) by mouth daily.   prednisoLONE acetate (PRED FORTE) 1 % ophthalmic suspension Place 1 drop into the right eye 4 (four) times daily.   propranolol ER (INDERAL  LA) 80 MG 24 hr capsule TAKE (1) CAPSULE BY MOUTH AT BEDTIME.   rosuvastatin (CRESTOR) 10 MG tablet Take 1 tablet (10 mg total) by mouth daily. Discontinue pravastatin   scopolamine (TRANSDERM-SCOP, 1.5 MG,) 1 MG/3DAYS Place 1 patch (1.5 mg total) onto the skin every 3 (three) days.   SYSTANE ULTRA 0.4-0.3 % SOLN Apply 1 drop to eye 2 (two) times daily.   No facility-administered encounter medications on file as of 11/10/2022.    Allergies (verified) Codeine, Morphine and related, and Sulfa antibiotics   History: Past Medical History:  Diagnosis Date   Diverticulitis    Diverticulosis of colon (without mention of hemorrhage)    Esophageal reflux     Generalized anxiety disorder    Hiatal hernia    Hypertension    Iron deficiency anemia 01/19/2012   Mixed hyperlipidemia    OA (osteoarthritis)    PONV (postoperative nausea and vomiting)    Unspecified hypothyroidism    Past Surgical History:  Procedure Laterality Date   ABDOMINAL HYSTERECTOMY     APPENDECTOMY     BOWEL RESECTION  05/11/2012   Procedure: LOW ANTERIOR BOWEL RESECTION;  Surgeon: Jamesetta So, MD;  Location: [facility];  Service: General;;   CESAREAN SECTION     First one 68 , second on [place]  05/11/2012   Procedure: HAND ASSISTED LAPAROSCOPIC COLON RESECTION;  Surgeon: Jamesetta So, MD;  Location: [facility];  Service: General;  Laterality: N/A;  Laparoscopic Hand Assisted Partial Colectomy;   COLONOSCOPY  2011-2008   Dr. Arnoldo Morale   COLONOSCOPY  03/10/2012   Procedure: COLONOSCOPY;  Surgeon: Rogene Houston, MD;  Location: [facility];  Service: Endoscopy;  Laterality: N/A;  35   Hysterctomy  1988   TONSILLECTOMY     Family History  Problem Relation Age of Onset   Rheum arthritis Mother    Heart disease Mother    Healthy Son    Healthy Son    Anesthesia problems Neg Hx    Hypotension Neg Hx    Malignant hyperthermia Neg Hx    Pseudochol deficiency Neg Hx    Social History   Socioeconomic History   Marital status: Married    Spouse name: Not on file   Number of children: Not on file   Years of education: Not on file   Highest education level: Not on file  Occupational History   Not on file  Tobacco Use   Smoking status: Never   Smokeless tobacco: Never  Vaping Use   Vaping Use: Never used  Substance and Sexual Activity   Alcohol use: No   Drug use: No   Sexual activity: Yes    Birth control/protection: Surgical  Other Topics Concern   Not on file  Social History Narrative   Not on file   Social Determinants of Health   Financial Resource Strain: Low Risk  (11/10/2022)   Overall Financial Resource Strain (CARDIA)     Difficulty of Paying Living Expenses: Not hard at all  Food Insecurity: No Food Insecurity (11/10/2022)   Hunger Vital Sign    Worried About Running Out of Food in the Last Year: Never true    Ran Out of Food in the Last Year: Never true  Transportation Needs: No Transportation Needs (11/10/2022)   PRAPARE - Hydrologist (Medical): No    Lack of Transportation (Non-Medical): No  Physical Activity: Sufficiently Active (11/10/2022)   Exercise Vital Sign  Days of Exercise per Week: 5 days    Minutes of Exercise per Session: 60 min  Stress: Stress Concern Present (11/10/2022)   [place]    Feeling of Stress : Very much  Social Connections: Socially Integrated (11/10/2022)   Social Connection and Isolation Panel [NHANES]    Frequency of Communication with Friends and Family: More than three times a week    Frequency of Social Gatherings with Friends and Family: Three times a week    Attends Religious Services: More than 4 times per year    Active Member of Clubs or Organizations: Yes    Attends Music therapist: More than 4 times per year    Marital Status: Married    Tobacco Counseling Counseling given: Not Answered   Clinical Intake:  Pre-visit preparation completed: Yes  Pain : No/denies pain   BMI - recorded: 30.79 Nutritional Status: BMI > 30  Obese Diabetes: No  How often do you need to have someone help you when you read instructions, pamphlets, or other written materials from your doctor or pharmacy?: 1 - Never  Diabetic?No  Interpreter Needed?: No  Information entered by :: Denman George LPN   Activities of Daily Living    11/10/2022   12:58 PM  In your present state of health, do you have any difficulty performing the following activities:  Hearing? 0  Vision? 0  Difficulty concentrating or making decisions? 0  Walking or climbing stairs? 0   Dressing or bathing? 0  Doing errands, shopping? 0  Preparing Food and eating ? N  Using the Toilet? N  In the past six months, have you accidently leaked urine? N  Do you have problems with loss of bowel control? N  Managing your Medications? N  Managing your Finances? N  Housekeeping or managing your Housekeeping? N    Patient Care Team: [name], MD as PCP - General (Family Medicine)  Indicate any recent Medical Services you may have received from other than [facility] providers in the past year (date may be approximate).     Assessment:   This is a routine wellness examination for Debra [name].  Hearing/Vision screen Hearing Screening - Comments:: No concerns   Vision Screening - Comments:: Wears rx glasses - up to date with routine eye exams with Dr. Jorja Loa    Dietary issues and exercise activities discussed: Current Exercise Habits: Structured exercise class, Type of exercise: calisthenics;yoga, Time (Minutes): 60, Frequency (Times/Week): 3, Weekly Exercise (Minutes/Week): 180, Intensity: Moderate   Goals Addressed               This Visit's Progress     Patient Stated (pt-stated)        Manage current life stressors       Depression Screen    11/10/2022   11:59 AM 11/06/2021    3:01 PM 04/29/2021   12:30 PM 09/23/2020   10:36 AM 03/01/2020   12:15 PM 08/18/2019   12:17 PM 10/04/2018    8:08 AM  PHQ 2/9 Scores  PHQ - 2 Score 0 0 0 1 1 0 0  PHQ- 9 Score  1 0 3 1      Fall Risk    11/10/2022   11:58 AM 11/06/2021    3:01 PM 02/25/2021   10:57 AM 09/23/2020   10:36 AM 03/01/2020   12:15 PM  Fall Risk   Falls in the past year? 0 0 0 0 0  Number falls in past yr: 0 0 0    Injury with Fall? 0 0 0    Risk for fall due to :    No Fall Risks No Fall Risks  Follow up Falls evaluation completed;Education provided;Falls prevention discussed   Falls evaluation completed Falls evaluation completed    FALL RISK PREVENTION PERTAINING TO THE HOME:  Any stairs in  or around the home? Yes  If so, are there any without handrails? No  Home free of loose throw rugs in walkways, pet beds, electrical cords, etc? Yes  Adequate lighting in your home to reduce risk of falls? Yes   ASSISTIVE DEVICES UTILIZED TO PREVENT FALLS:  Life alert? No  Use of a cane, walker or w/c? No  Grab bars in the bathroom? No  Shower chair or bench in shower? No  Elevated toilet seat or a handicapped toilet? No   TIMED UP AND GO:  Was the test performed? No .  Length of time to ambulate 10 feet: telephonic visit   Cognitive Function:        11/10/2022   12:58 PM  6CIT Screen  What Year? 0 points  What month? 0 points  What time? 0 points  Count back from 20 0 points  Months in reverse 0 points  Repeat phrase 0 points  Total Score 0 points    Immunizations Immunization History  Administered Date(s) Administered   COVID-19, mRNA, vaccine(Comirnaty)12 years and older 09/04/2022   Fluad Quad(high Dose 65+) 08/18/2019, 09/13/2020   Influenza Split 10/21/2013   Influenza, High Dose Seasonal PF 09/20/2018   Influenza,inj,Quad PF,6+ Mos 08/19/2015   Influenza-Unspecified 09/12/2007, 09/20/2021, 09/04/2022   PFIZER(Purple Top)SARS-COV-2 Vaccination 01/12/2020, 02/06/2020, 09/05/2020   Pfizer Covid-19 Vaccine Bivalent Booster 68yr & up 09/20/2021   Pneumococcal Conjugate-13 08/19/2015   Pneumococcal Polysaccharide-23 02/09/2014   RSV,unspecified 09/04/2022   Tdap 05/07/2013   Zoster, Live 07/08/2013    TDAP status: Up to date  Flu Vaccine status: Up to date  Pneumococcal vaccine status: Up to date  Covid-19 vaccine status: Information provided on how to obtain vaccines.   Qualifies for Shingles Vaccine? Yes   Zostavax completed No   Shingrix Completed?: No.    Education has been provided regarding the importance of this vaccine. Patient has been advised to call insurance company to determine out of pocket expense if they have not yet received this  vaccine. Advised may also receive vaccine at local pharmacy or Health Dept. Verbalized acceptance and understanding.  Screening Tests Health Maintenance  Topic Date Due   Zoster Vaccines- Shingrix (1 of 2) Never done   Pneumonia Vaccine 72 Years old (3 - PPSV23 or PCV20) 02/10/2019   COLONOSCOPY (Pts 45-432yrInsurance coverage will need to be confirmed)  03/10/2022   DTaP/Tdap/Td (2 - Td or Tdap) 05/08/2023   Medicare Annual Wellness (AWV)  11/11/2023   MAMMOGRAM  04/20/2024   INFLUENZA VACCINE  Completed   DEXA SCAN  Completed   COVID-19 Vaccine  Completed   Hepatitis C Screening  Completed   HPV VACCINES  Aged Out    Health Maintenance  Health Maintenance Due  Topic Date Due   Zoster Vaccines- Shingrix (1 of 2) Never done   Pneumonia Vaccine 6513Years old (3 68 PPSV23 or PCV20) 02/10/2019   COLONOSCOPY (Pts [id number] coverage will need to be confirmed)  03/10/2022    Colorectal cancer screening: Referral to GI placed at next office visit after discussion with provider . Pt aware the  office will call re: appt.  Mammogram status: Completed 04/20/22. Repeat every year  Bone Density status: Completed 12/14/16. Results reflect: Bone density results: NORMAL. Repeat every as directed years.  Lung Cancer Screening: (Low Dose CT Chest recommended if Age 52-80 years, 30 pack-year currently smoking OR have quit w/in 15years.) does not qualify.   Lung Cancer Screening Referral: n/a  Additional Screening:  Hepatitis C Screening: does qualify; Completed 09/23/20  Vision Screening: Recommended annual ophthalmology exams for early detection of glaucoma and other disorders of the eye. Is the patient up to date with their annual eye exam?  Yes  Who is the provider or what is the name of the office in which the patient attends annual eye exams? [facility] If pt is not established with a provider, would they like to be referred to a provider to establish care?  N/a .   Dental  Screening: Recommended annual dental exams for proper oral hygiene  Community Resource Referral / Chronic Care Management: CRR required this visit?  No   CCM required this visit?  No      Plan:     I have personally reviewed and noted the following in the patient's chart:   Medical and social history Use of alcohol, tobacco or illicit drugs  Current medications and supplements including opioid prescriptions. Patient is not currently taking opioid prescriptions. Functional ability and status Nutritional status Physical activity Advanced directives List of other physicians Hospitalizations, surgeries, and ER visits in previous 12 months Vitals Screenings to include cognitive, depression, and falls Referrals and appointments  In addition, I have reviewed and discussed with patient certain preventive protocols, quality metrics, and best practice recommendations. A written personalized care plan for preventive services as well as general preventive health recommendations were provided to patient.     Vanetta Mulders, LPN   16/12/958   Due to this being a virtual visit, the after visit summary with patients personalized plan was offered to patient via mail or my-chart.  Patient would like to access on my-chart  Nurse Notes: Patient has had a life change in which son and his 2 young daughters are now living with her.  She is asking for a refill of her Ativan to help in coping with this change.  She has an appointment scheduled for 11/13/22 as well.

## 2022-11-10 NOTE — Patient Instructions (Signed)
Debra [name] , Thank you for taking time to come for your Medicare Wellness Visit. I appreciate your ongoing commitment to your health goals. Please review the following plan we discussed and let me know if I can assist you in the future.   These are the goals we discussed:  Goals       Patient Stated (pt-stated)      Manage current life stressors         This is a list of the screening recommended for you and due dates:  Health Maintenance  Topic Date Due   Zoster (Shingles) Vaccine (1 of 2) Never done   Pneumonia Vaccine (3 - PPSV23 or PCV20) 02/10/2019   Colon Cancer Screening  03/10/2022   DTaP/Tdap/Td vaccine (2 - Td or Tdap) 05/08/2023   Medicare Annual Wellness Visit  11/11/2023   Mammogram  04/20/2024   Flu Shot  Completed   DEXA scan (bone density measurement)  Completed   COVID-19 Vaccine  Completed   Hepatitis C Screening: USPSTF Recommendation to screen - Ages 87-79 yo.  Completed   HPV Vaccine  Aged Out    Advanced directives: Please bring a copy of your health care power of attorney and living will to the office to be added to your chart at your convenience.   Conditions/risks identified: Aim for 30 minutes of exercise or brisk walking, 6-8 glasses of water, and 5 servings of fruits and vegetables each day.   Next appointment: Follow up in one year for your annual wellness visit    Preventive Care 65 Years and Older, Female Preventive care refers to lifestyle choices and visits with your health care provider that can promote health and wellness. What does preventive care include? A yearly physical exam. This is also called an annual well check. Dental exams once or twice a year. Routine eye exams. Ask your health care provider how often you should have your eyes checked. Personal lifestyle choices, including: Daily care of your teeth and gums. Regular physical activity. Eating a healthy diet. Avoiding tobacco and drug use. Limiting alcohol use. Practicing  safe sex. Taking low-dose aspirin every day. Taking vitamin and mineral supplements as recommended by your health care provider. What happens during an annual well check? The services and screenings done by your health care provider during your annual well check will depend on your age, overall health, lifestyle risk factors, and family history of disease. Counseling  Your health care provider may ask you questions about your: Alcohol use. Tobacco use. Drug use. Emotional well-being. Home and relationship well-being. Sexual activity. Eating habits. History of falls. Memory and ability to understand (cognition). Work and work Statistician. Reproductive health. Screening  You may have the following tests or measurements: Height, weight, and BMI. Blood pressure. Lipid and cholesterol levels. These may be checked every 5 years, or more frequently if you are over 60 years old. Skin check. Lung cancer screening. You may have this screening every year starting at age 53 if you have a 30-pack-year history of smoking and currently smoke or have quit within the past 15 years. Fecal occult blood test (FOBT) of the stool. You may have this test every year starting at age 60. Flexible sigmoidoscopy or colonoscopy. You may have a sigmoidoscopy every 5 years or a colonoscopy every 10 years starting at age 70. Hepatitis C blood test. Hepatitis B blood test. Sexually transmitted disease (STD) testing. Diabetes screening. This is done by checking your blood sugar (glucose) after you have not  eaten for a while (fasting). You may have this done every 1-3 years. Bone density scan. This is done to screen for osteoporosis. You may have this done starting at age 68. Mammogram. This may be done every 1-2 years. Talk to your health care provider about how often you should have regular mammograms. Talk with your health care provider about your test results, treatment options, and if necessary, the need for more  tests. Vaccines  Your health care provider may recommend certain vaccines, such as: Influenza vaccine. This is recommended every year. Tetanus, diphtheria, and acellular pertussis (Tdap, Td) vaccine. You may need a Td booster every 10 years. Zoster vaccine. You may need this after age 81. Pneumococcal 13-valent conjugate (PCV13) vaccine. One dose is recommended after age 42. Pneumococcal polysaccharide (PPSV23) vaccine. One dose is recommended after age 34. Talk to your health care provider about which screenings and vaccines you need and how often you need them. This information is not intended to replace advice given to you by your health care provider. Make sure you discuss any questions you have with your health care provider. Document Released: 12/20/2015 Document Revised: 08/12/2016 Document Reviewed: 09/24/2015 Elsevier Interactive Patient Education  2017 [place] Prevention in the Home Falls can cause injuries. They can happen to people of all ages. There are many things you can do to make your home safe and to help prevent falls. What can I do on the outside of my home? Regularly fix the edges of walkways and driveways and fix any cracks. Remove anything that might make you trip as you walk through a door, such as a raised step or threshold. Trim any bushes or trees on the path to your home. Use bright outdoor lighting. Clear any walking paths of anything that might make someone trip, such as rocks or tools. Regularly check to see if handrails are loose or broken. Make sure that both sides of any steps have handrails. Any raised decks and porches should have guardrails on the edges. Have any leaves, snow, or ice cleared regularly. Use sand or salt on walking paths during winter. Clean up any spills in your garage right away. This includes oil or grease spills. What can I do in the bathroom? Use night lights. Install grab bars by the toilet and in the tub and shower.  Do not use towel bars as grab bars. Use non-skid mats or decals in the tub or shower. If you need to sit down in the shower, use a plastic, non-slip stool. Keep the floor dry. Clean up any water that spills on the floor as soon as it happens. Remove soap buildup in the tub or shower regularly. Attach bath mats securely with double-sided non-slip rug tape. Do not have throw rugs and other things on the floor that can make you trip. What can I do in the bedroom? Use night lights. Make sure that you have a light by your bed that is easy to reach. Do not use any sheets or blankets that are too big for your bed. They should not hang down onto the floor. Have a firm chair that has side arms. You can use this for support while you get dressed. Do not have throw rugs and other things on the floor that can make you trip. What can I do in the kitchen? Clean up any spills right away. Avoid walking on wet floors. Keep items that you use a lot in easy-to-reach places. If you need to reach  something above you, use a strong step stool that has a grab bar. Keep electrical cords out of the way. Do not use floor polish or wax that makes floors slippery. If you must use wax, use non-skid floor wax. Do not have throw rugs and other things on the floor that can make you trip. What can I do with my stairs? Do not leave any items on the stairs. Make sure that there are handrails on both sides of the stairs and use them. Fix handrails that are broken or loose. Make sure that handrails are as long as the stairways. Check any carpeting to make sure that it is firmly attached to the stairs. Fix any carpet that is loose or worn. Avoid having throw rugs at the top or bottom of the stairs. If you do have throw rugs, attach them to the floor with carpet tape. Make sure that you have a light switch at the top of the stairs and the bottom of the stairs. If you do not have them, ask someone to add them for you. What else  can I do to help prevent falls? Wear shoes that: Do not have high heels. Have rubber bottoms. Are comfortable and fit you well. Are closed at the toe. Do not wear sandals. If you use a stepladder: Make sure that it is fully opened. Do not climb a closed stepladder. Make sure that both sides of the stepladder are locked into place. Ask someone to hold it for you, if possible. Clearly mark and make sure that you can see: Any grab bars or handrails. First and last steps. Where the edge of each step is. Use tools that help you move around (mobility aids) if they are needed. These include: Canes. Walkers. Scooters. Crutches. Turn on the lights when you go into a dark area. Replace any light bulbs as soon as they burn out. Set up your furniture so you have a clear path. Avoid moving your furniture around. If any of your floors are uneven, fix them. If there are any pets around you, be aware of where they are. Review your medicines with your doctor. Some medicines can make you feel dizzy. This can increase your chance of falling. Ask your doctor what other things that you can do to help prevent falls. This information is not intended to replace advice given to you by your health care provider. Make sure you discuss any questions you have with your health care provider. Document Released: 09/19/2009 Document Revised: 04/30/2016 Document Reviewed: 12/28/2014 Elsevier Interactive Patient Education  2017 [employer].

## 2022-11-13 ENCOUNTER — Ambulatory Visit: Payer: Medicare PPO | Admitting: Family Medicine

## 2022-11-13 ENCOUNTER — Encounter: Payer: Self-pay | Admitting: Family Medicine

## 2022-11-13 VITALS — BP 132/82 | HR 51 | Ht 65.0 in | Wt 180.6 lb

## 2022-11-13 DIAGNOSIS — E7800 Pure hypercholesterolemia, unspecified: Secondary | ICD-10-CM

## 2022-11-13 MED ORDER — LORAZEPAM 0.5 MG PO TABS: 0.5000 mg | ORAL_TABLET | Freq: Three times a day (TID) | ORAL | 0 refills | Status: DC | PRN

## 2022-11-13 NOTE — Progress Notes (Signed)
Subjective:    Patient ID: Debra [name], female    DOB: [birth year], 71 y.o.   MRN: [id number]  HPI Patient is here today for follow-up of her hyperlipidemia.  She is also requesting a refill on her propranolol as well as on her lorazepam.  She is under a tremendous amount of stress in her family.  Her daughter-in-law will recently left her son.  They are having issues with visitation with her grandchildren.  This is causing her to feel extremely anxious and have problems with anxiety at times.  She would like to temporarily increase lorazepam as needed.  She uses this medication sparingly 2-3 times a week.  60 tablets typically last for months.  However she is afraid she may need to use the medication twice a day for short period of time.  She is very tearful today when she discusses the situation.  She denies any clinical depression.  Obviously she is grieving the situation.  She is also having trouble sleeping.  Her blood pressure is remarkably well-controlled.  She does have what appears to be an essential tremor in her left hand.  She states that this has been going on for about 1 year but is stable and is worsened with activity or anxiety Past Medical History:  Diagnosis Date   Diverticulitis    Diverticulosis of colon (without mention of hemorrhage)    Esophageal reflux    Generalized anxiety disorder    Hiatal hernia    Hypertension    Iron deficiency anemia 01/19/2012   Mixed hyperlipidemia    OA (osteoarthritis)    PONV (postoperative nausea and vomiting)    Unspecified hypothyroidism    Past Surgical History:  Procedure Laterality Date   ABDOMINAL HYSTERECTOMY     APPENDECTOMY     BOWEL RESECTION  05/11/2012   Procedure: LOW ANTERIOR BOWEL RESECTION;  Surgeon: Jamesetta So, MD;  Location: [facility];  Service: General;;   CESAREAN SECTION     First one 82 , second on [place]  05/11/2012   Procedure: HAND ASSISTED LAPAROSCOPIC COLON RESECTION;  Surgeon: Jamesetta So, MD;  Location: [facility];  Service: General;  Laterality: N/A;  Laparoscopic Hand Assisted Partial Colectomy;   COLONOSCOPY  2011-2008   Dr. Arnoldo Morale   COLONOSCOPY  03/10/2012   Procedure: COLONOSCOPY;  Surgeon: Rogene Houston, MD;  Location: [facility];  Service: Endoscopy;  Laterality: N/A;  200   Hysterctomy  1988   TONSILLECTOMY     Current Outpatient Medications on File Prior to Visit  Medication Sig Dispense Refill   amLODipine (NORVASC) 5 MG tablet TAKE (1) TABLET BY MOUTH ONCE DAILY. 30 tablet 0   buPROPion (WELLBUTRIN XL) 300 MG 24 hr tablet TAKE ONE TABLET BY MOUTH ONCE DAILY 90 tablet 0   Calcium Carbonate-Vit D-Min (CALCIUM 1200 PO) Take 1 tablet by mouth daily in the afternoon.     Cholecalciferol (VITAMIN D3 PO) Take by mouth.     fluticasone (FLONASE) 50 MCG/ACT nasal spray Place 2 sprays into both nostrils daily. 16 g 6   levothyroxine (SYNTHROID) 25 MCG tablet TAKE (1) TABLET BY MOUTH ONCE DAILY. 90 tablet 0   meclizine (ANTIVERT) 12.5 MG tablet Take 1 tablet (12.5 mg total) by mouth 3 (three) times daily as needed for dizziness. 30 tablet 1   meloxicam (MOBIC) 15 MG tablet Take 1 tablet (15 mg total) by mouth daily. 30 tablet 0   prednisoLONE acetate (PRED FORTE)  1 % ophthalmic suspension Place 1 drop into the right eye 4 (four) times daily.     propranolol ER (INDERAL LA) 80 MG 24 hr capsule TAKE (1) CAPSULE BY MOUTH AT BEDTIME. 90 capsule 3   rosuvastatin (CRESTOR) 10 MG tablet Take 1 tablet (10 mg total) by mouth daily. Discontinue pravastatin 90 tablet 3   scopolamine (TRANSDERM-SCOP, 1.5 MG,) 1 MG/3DAYS Place 1 patch (1.5 mg total) onto the skin every 3 (three) days. 10 patch 12   SYSTANE ULTRA 0.4-0.3 % SOLN Apply 1 drop to eye 2 (two) times daily.     No current facility-administered medications on file prior to visit.   Allergies  Allergen Reactions   Codeine Nausea And Vomiting   Morphine And Related Nausea And Vomiting   Sulfa Antibiotics Hives and  Itching   Social History   Socioeconomic History   Marital status: Married    Spouse name: Not on file   Number of children: Not on file   Years of education: Not on file   Highest education level: Not on file  Occupational History   Not on file  Tobacco Use   Smoking status: Never   Smokeless tobacco: Never  Vaping Use   Vaping Use: Never used  Substance and Sexual Activity   Alcohol use: No   Drug use: No   Sexual activity: Yes    Birth control/protection: Surgical  Other Topics Concern   Not on file  Social History Narrative   Not on file   Social Determinants of Health   Financial Resource Strain: Low Risk  (11/10/2022)   Overall Financial Resource Strain (CARDIA)    Difficulty of Paying Living Expenses: Not hard at all  Food Insecurity: No Food Insecurity (11/10/2022)   Hunger Vital Sign    Worried About Running Out of Food in the Last Year: Never true    [place] in the Last Year: Never true  Transportation Needs: No Transportation Needs (11/10/2022)   PRAPARE - Hydrologist (Medical): No    Lack of Transportation (Non-Medical): No  Physical Activity: Sufficiently Active (11/10/2022)   Exercise Vital Sign    Days of Exercise per Week: 5 days    Minutes of Exercise per Session: 60 min  Stress: Stress Concern Present (11/10/2022)   [place]    Feeling of Stress : Very much  Social Connections: Socially Integrated (11/10/2022)   Social Connection and Isolation Panel [NHANES]    Frequency of Communication with Friends and Family: More than three times a week    Frequency of Social Gatherings with Friends and Family: Three times a week    Attends Religious Services: More than 4 times per year    Active Member of Clubs or Organizations: Yes    Attends Archivist Meetings: More than 4 times per year    Marital Status: Married  Human resources officer Violence: Not  At Risk (11/10/2022)   Humiliation, Afraid, Rape, and Kick questionnaire    Fear of Current or Ex-Partner: No    Emotionally Abused: No    Physically Abused: No    Sexually Abused: No      Review of Systems  All other systems reviewed and are negative.      Objective:   Physical Exam Vitals reviewed.  Constitutional:      Appearance: Normal appearance. She is well-developed and normal weight.  Cardiovascular:     Rate  and Rhythm: Normal rate and regular rhythm.     Heart sounds: Normal heart sounds. No murmur heard.    No friction rub. No gallop.  Pulmonary:     Effort: Pulmonary effort is normal. No respiratory distress.     Breath sounds: Normal breath sounds. No stridor. No wheezing or rhonchi.  Musculoskeletal:     Right lower leg: No edema.     Left lower leg: No edema.  Neurological:     General: No focal deficit present.     Mental Status: She is alert and oriented to person, place, and time. Mental status is at baseline.     Cranial Nerves: No cranial nerve deficit.           Assessment & Plan:  Pure hypercholesterolemia - Plan: CBC with Differential/Platelet, Lipid panel, COMPLETE METABOLIC PANEL WITH GFR  Her blood pressure is excellent today but I would like to check a CMP and a lipid panel to see how she is responding to Crestor.  We will refill her propranolol and her rosuvastatin.  Also gave her 90 lorazepam because anticipated she may need the medication twice a day for short period of time.  I then encouraged her to try to wean back as quickly as possible and use the medication sparingly

## 2022-11-14 LAB — CBC WITH DIFFERENTIAL/PLATELET
Absolute Monocytes: 464 cells/uL (ref 200–950)
Basophils Absolute: 49 cells/uL (ref 0–200)
Basophils Relative: 0.9 %
Eosinophils Absolute: 108 cells/uL (ref 15–500)
Eosinophils Relative: 2 %
HCT: 43.5 % (ref 35.0–45.0)
Hemoglobin: 14.7 g/dL (ref 11.7–15.5)
Lymphs Abs: 1868 cells/uL (ref 850–3900)
MCH: 31.1 pg (ref 27.0–33.0)
MCHC: 33.8 g/dL (ref 32.0–36.0)
MCV: 92 fL (ref 80.0–100.0)
MPV: 10.9 fL (ref 7.5–12.5)
Monocytes Relative: 8.6 %
Neutro Abs: 2911 cells/uL (ref 1500–7800)
Neutrophils Relative %: 53.9 %
Platelets: 230 10*3/uL (ref 140–400)
RBC: 4.73 10*6/uL (ref 3.80–5.10)
RDW: 12.5 % (ref 11.0–15.0)
Total Lymphocyte: 34.6 %
WBC: 5.4 10*3/uL (ref 3.8–10.8)

## 2022-11-14 LAB — COMPLETE METABOLIC PANEL WITH GFR
AG Ratio: 1.7 (calc) (ref 1.0–2.5)
ALT: 19 U/L (ref 6–29)
AST: 17 U/L (ref 10–35)
Albumin: 4.5 g/dL (ref 3.6–5.1)
Alkaline phosphatase (APISO): 57 U/L (ref 37–153)
BUN/Creatinine Ratio: 22 (calc) (ref 6–22)
BUN: 23 mg/dL (ref 7–25)
CO2: 24 mmol/L (ref 20–32)
Calcium: 9.5 mg/dL (ref 8.6–10.4)
Chloride: 106 mmol/L (ref 98–110)
Creat: 1.05 mg/dL — ABNORMAL HIGH (ref 0.60–1.00)
Globulin: 2.6 g/dL (calc) (ref 1.9–3.7)
Glucose, Bld: 102 mg/dL — ABNORMAL HIGH (ref 65–99)
Potassium: 4.9 mmol/L (ref 3.5–5.3)
Sodium: 138 mmol/L (ref 135–146)
Total Bilirubin: 0.5 mg/dL (ref 0.2–1.2)
Total Protein: 7.1 g/dL (ref 6.1–8.1)
eGFR: 56 mL/min/{1.73_m2} — ABNORMAL LOW (ref >=60)

## 2022-11-14 LAB — LIPID PANEL
Cholesterol: 164 mg/dL (ref <200)
HDL: 67 mg/dL (ref >=50)
LDL Cholesterol (Calc): 73 mg/dL (calc)
Non-HDL Cholesterol (Calc): 97 mg/dL (calc) (ref <130)
Total CHOL/HDL Ratio: 2.4 (calc) (ref <5.0)
Triglycerides: 154 mg/dL — ABNORMAL HIGH (ref <150)

## 2022-11-18 ENCOUNTER — Other Ambulatory Visit: Payer: Self-pay | Admitting: Family Medicine

## 2022-11-18 NOTE — Telephone Encounter (Signed)
Last OV 11/13/22.  Requested Prescriptions  Pending Prescriptions Disp Refills   propranolol ER (INDERAL LA) 80 MG 24 hr capsule [Pharmacy Med Name: PROPRANOLOL HCL ER 80 CAPS] 90 capsule 0    Sig: TAKE (1) CAPSULE BY MOUTH AT BEDTIME.     Cardiovascular:  Beta Blockers Failed - 11/18/2022 10:14 AM      Failed - Valid encounter within last 6 months    Recent Outpatient Visits           7 months ago Subacute cough   [place] Dennard Schaumann, Cammie Mcgee, MD   10 months ago Lipoma of torso   [place], Cammie Mcgee, MD   1 year ago Routine general medical examination at a health care facility   [place], Cammie Mcgee, MD   1 year ago Pure hypercholesterolemia   [place] Dennard Schaumann, Cammie Mcgee, MD   1 year ago [place] Noemi Chapel A, NP              Passed - Last BP in normal range    BP Readings from Last 1 Encounters:  11/13/22 132/82         Passed - Last Heart Rate in normal range    Pulse Readings from Last 1 Encounters:  11/13/22 (!) 51

## 2022-12-03 ENCOUNTER — Other Ambulatory Visit: Payer: Self-pay | Admitting: "Endocrinology

## 2022-12-03 ENCOUNTER — Other Ambulatory Visit: Payer: Self-pay | Admitting: Family Medicine

## 2022-12-03 DIAGNOSIS — E039 Hypothyroidism, unspecified: Secondary | ICD-10-CM

## 2022-12-16 ENCOUNTER — Ambulatory Visit (HOSPITAL_COMMUNITY)
Admission: RE | Admit: 2022-12-16 | Discharge: 2022-12-16 | Disposition: A | Discharge status: Home / Self Care | Payer: Medicare PPO | Source: Ambulatory Visit | Attending: "Endocrinology | Admitting: "Endocrinology

## 2022-12-16 DIAGNOSIS — E042 Nontoxic multinodular goiter: Secondary | ICD-10-CM | POA: Diagnosis not present

## 2022-12-17 ENCOUNTER — Other Ambulatory Visit: Payer: Self-pay | Admitting: Family Medicine

## 2023-01-20 LAB — TSH: TSH: 2.81 u[IU]/mL (ref 0.450–4.500)

## 2023-01-20 LAB — T4, FREE: Free T4: 1.39 ng/dL (ref 0.82–1.77)

## 2023-01-21 ENCOUNTER — Ambulatory Visit: Payer: Medicare PPO | Admitting: "Endocrinology

## 2023-01-22 ENCOUNTER — Ambulatory Visit: Payer: Medicare PPO | Admitting: "Endocrinology

## 2023-01-22 ENCOUNTER — Encounter: Payer: Self-pay | Admitting: "Endocrinology

## 2023-01-22 VITALS — BP 112/70 | HR 60 | Ht 65.0 in | Wt 181.4 lb

## 2023-01-22 DIAGNOSIS — E039 Hypothyroidism, unspecified: Secondary | ICD-10-CM

## 2023-01-22 NOTE — Progress Notes (Signed)
01/22/2023, 1:27 PM   Endocrinology follow-up note  Subjective:    Patient ID: Debra [name], female    DOB: [birth year], PCP [name], MD   Past Medical History:  Diagnosis Date   Diverticulitis    Diverticulosis of colon (without mention of hemorrhage)    Esophageal reflux    Generalized anxiety disorder    Hiatal hernia    Hypertension    Iron deficiency anemia 01/19/2012   Mixed hyperlipidemia    OA (osteoarthritis)    PONV (postoperative nausea and vomiting)    Unspecified hypothyroidism    Past Surgical History:  Procedure Laterality Date   ABDOMINAL HYSTERECTOMY     APPENDECTOMY     BOWEL RESECTION  05/11/2012   Procedure: LOW ANTERIOR BOWEL RESECTION;  Surgeon: Jamesetta So, MD;  Location: [facility];  Service: General;;   CESAREAN SECTION     First one 83 , second on [place]  05/11/2012   Procedure: HAND ASSISTED LAPAROSCOPIC COLON RESECTION;  Surgeon: Jamesetta So, MD;  Location: [facility];  Service: General;  Laterality: N/A;  Laparoscopic Hand Assisted Partial Colectomy;   COLONOSCOPY  2011-2008   Dr. Arnoldo Morale   COLONOSCOPY  03/10/2012   Procedure: COLONOSCOPY;  Surgeon: Rogene Houston, MD;  [facility];  Service: Endoscopy;  Laterality: N/A;  200   Hysterctomy  1988   TONSILLECTOMY     Social History   Socioeconomic History   Marital status: Married    Spouse name: Not on file   Number of children: Not on file   Years of education: Not on file   Highest education level: Not on file  Occupational History   Not on file  Tobacco Use   Smoking status: Never   Smokeless tobacco: Never  Vaping Use   Vaping Use: Never used  Substance and Sexual Activity   Alcohol use: No   Drug use: No   Sexual activity: Yes    Birth control/protection: Surgical  Other Topics Concern   Not on file  Social History Narrative   Not on file   Social Determinants of Health    Financial Resource Strain: Low Risk  (11/10/2022)   Overall Financial Resource Strain (CARDIA)    Difficulty of Paying Living Expenses: Not hard at all  Food Insecurity: No Food Insecurity (11/10/2022)   Hunger Vital Sign    Worried About Running Out of Food in the Last Year: Never true    [place] in the Last Year: Never true  Transportation Needs: No Transportation Needs (11/10/2022)   PRAPARE - Hydrologist (Medical): No    Lack of Transportation (Non-Medical): No  Physical Activity: Sufficiently Active (11/10/2022)   Exercise Vital Sign    Days of Exercise per Week: 5 days    Minutes of Exercise per Session: 60 min  Stress: Stress Concern Present (11/10/2022)   [place]    Feeling of Stress : Very much  Social Connections: Socially Integrated (11/10/2022)   Social Connection and Isolation Panel [NHANES]    Frequency of Communication with Friends and Family: More than three times a  week    Frequency of Social Gatherings with Friends and Family: Three times a week    Attends Religious Services: More than 4 times per year    Active Member of Clubs or Organizations: Yes    Attends Music therapist: More than 4 times per year    Marital Status: Married   Family History  Problem Relation Age of Onset   Rheum arthritis Mother    Heart disease Mother    Healthy Son    Healthy Son    Anesthesia problems Neg Hx    Hypotension Neg Hx    Malignant hyperthermia Neg Hx    Pseudochol deficiency Neg Hx    Outpatient Encounter Medications as of 01/22/2023  Medication Sig   amLODipine (NORVASC) 5 MG tablet TAKE (1) TABLET BY MOUTH ONCE DAILY.   buPROPion (WELLBUTRIN XL) 300 MG 24 hr tablet TAKE ONE TABLET BY MOUTH ONCE DAILY   Calcium Carbonate-Vit D-Min (CALCIUM 1200 PO) Take 1 tablet by mouth daily in the afternoon.   Cholecalciferol (VITAMIN D3 PO) Take by mouth.    fluticasone (FLONASE) 50 MCG/ACT nasal spray Place 2 sprays into both nostrils daily.   levothyroxine (SYNTHROID) 25 MCG tablet TAKE (1) TABLET BY MOUTH ONCE DAILY.   LORazepam (ATIVAN) 0.5 MG tablet Take 1 tablet (0.5 mg total) by mouth every 8 (eight) hours as needed for anxiety.   meclizine (ANTIVERT) 12.5 MG tablet Take 1 tablet (12.5 mg total) by mouth 3 (three) times daily as needed for dizziness.   meloxicam (MOBIC) 15 MG tablet Take 1 tablet (15 mg total) by mouth daily.   propranolol ER (INDERAL LA) 80 MG 24 hr capsule TAKE (1) CAPSULE BY MOUTH AT BEDTIME.   rosuvastatin (CRESTOR) 10 MG tablet Take 1 tablet (10 mg total) by mouth daily. Discontinue pravastatin   SYSTANE ULTRA 0.4-0.3 % SOLN Apply 1 drop to eye 2 (two) times daily.   [DISCONTINUED] prednisoLONE acetate (PRED FORTE) 1 % ophthalmic suspension Place 1 drop into the right eye 4 (four) times daily.   [DISCONTINUED] scopolamine (TRANSDERM-SCOP, 1.5 MG,) 1 MG/3DAYS Place 1 patch (1.5 mg total) onto the skin every 3 (three) days.   No facility-administered encounter medications on file as of 01/22/2023.   ALLERGIES: Allergies  Allergen Reactions   Codeine Nausea And Vomiting   Morphine And Related Nausea And Vomiting   Sulfa Antibiotics Hives and Itching    VACCINATION STATUS: Immunization History  Administered Date(s) Administered   COVID-19, mRNA, vaccine(Comirnaty)12 years and older 09/04/2022   Fluad Quad(high Dose 65+) 08/18/2019, 09/13/2020   Influenza Split 10/21/2013   Influenza, High Dose Seasonal PF 09/20/2018   Influenza,inj,Quad PF,6+ Mos 08/19/2015   Influenza-Unspecified 09/12/2007, 09/20/2021, 09/04/2022   PFIZER(Purple Top)SARS-COV-2 Vaccination 01/12/2020, 02/06/2020, 09/05/2020   Pfizer Covid-19 Vaccine Bivalent Booster 28yr & up 09/20/2021   Pneumococcal Conjugate-13 08/19/2015   Pneumococcal Polysaccharide-23 02/09/2014   RSV,unspecified 09/04/2022   Tdap 05/07/2013   Zoster, Live 07/08/2013     HPI Debra VANDERPLAATSis 72y.o. female who is returning to follow-up for hypothyroidism, multinodular goiter.   PMD: PSusy Frizzle MD. -She is being treated for hypothyroidism and then on observation management for multinodular goiter.    She remains on levothyroxine 25 mcg p.o. daily before breakfast.   She continues to tolerate this medication and her previsit thyroid function tests are consistent with appropriate replacement.     She denies any prior history of thyroid surgery nor ablation.   She reports various  forms of thyroid dysfunction in her father members including her mother and 2 aunts.  She denies any family history of thyroid malignancy. She has no new complaints today, her pravastatin was switched to Crestor 10 mg by her PMD due to rising LDL to 113.     August 25, 2019 thyroid ultrasound reveals multiple nodules in bilateral thyroid lobes, largest one 1.2 cm in the left lobe, nonsuspicious.  Her October 29, 2020 thyroid ultrasound is also unremarkable.  Her repeat previsit thyroid ultrasound shows involution of most of the nodules with no concerning new findings. -She denies any dysphagia, shortness of breath, nor voice change.  She denies tremors, heat intolerance, palpitations.  Review of Systems Progressive weight loss.  Objective:    BP 112/70   Pulse 60   Ht 5' 5"$  (1.651 m)   Wt 181 lb 6.4 oz (82.3 kg)   BMI 30.19 kg/m   Wt Readings from Last 3 Encounters:  01/22/23 181 lb 6.4 oz (82.3 kg)  11/13/22 180 lb 9.6 oz (81.9 kg)  11/10/22 185 lb (83.9 kg)    Physical Exam   CMP ( most recent) CMP     Component Value Date/Time   NA 138 11/13/2022 1035   K 4.9 11/13/2022 1035   CL 106 11/13/2022 1035   CO2 24 11/13/2022 1035   GLUCOSE 102 (H) 11/13/2022 1035   BUN 23 11/13/2022 1035   CREATININE 1.05 (H) 11/13/2022 1035   CALCIUM 9.5 11/13/2022 1035   PROT 7.1 11/13/2022 1035   ALBUMIN 4.3 07/27/2017 0801   ALBUMIN 4.3 07/27/2017 0801   AST  17 11/13/2022 1035   ALT 19 11/13/2022 1035   ALKPHOS 54 07/27/2017 0801   ALKPHOS 54 07/27/2017 0801   BILITOT 0.5 11/13/2022 1035   GFRNONAA 53 (L) 04/29/2021 1259   GFRAA 62 04/29/2021 1259    Lipid Panel     Component Value Date/Time   CHOL 164 11/13/2022 1035   CHOL 192 07/13/2022 0814   TRIG 154 (H) 11/13/2022 1035   HDL 67 11/13/2022 1035   HDL 62 07/13/2022 0814   CHOLHDL 2.4 11/13/2022 1035   VLDL 27 07/27/2017 0801   LDLCALC 73 11/13/2022 1035      Lab Results  Component Value Date   TSH 2.810 01/19/2023   TSH 3.540 07/13/2022   TSH 2.280 11/14/2021   TSH 4.010 04/29/2021   TSH 2.370 11/05/2020   TSH 5.26 (H) 09/23/2020   TSH 3.73 05/21/2020   TSH 5.54 (H) 08/16/2019   TSH 3.97 10/02/2016   TSH 4.06 01/20/2016   FREET4 1.39 01/19/2023   FREET4 1.38 07/13/2022   FREET4 1.50 11/14/2021   FREET4 1.34 04/29/2021   FREET4 1.36 11/05/2020   FREET4 1.2 05/21/2020   FREET4 1.3 08/16/2019   FREET4 1.1 10/02/2016   FREET4 1.3 01/20/2016  August 25, 2019 thyroid ultrasound IMPRESSION: 1. Findings suggestive of multinodular goiter. 2. Nodule #4 which is 1.2 cm meets imaging criteria to recommend a 1 year follow-up as clinically indicated.   October 29, 2020 thyroid ultrasound. Right lobe measures 4.3 cm, left lobe measured 4.4 cm with 1.4 cm left lobe nodule remained stable. IMPRESSION: Multinodular thyroid again demonstrated.   Left inferior thyroid nodule (labeled 4, 1.4 cm, TR 4) and right inferior thyroid nodule (labeled 6, 1.1 cm, TR 4) both meet criteria for surveillance, as designated by the newly established ACR TI-RADS criteria. Surveillance ultrasound study recommended to be performed annually up to 5 years.  Surveillance thyroid/neck ultrasound  on December 16, 2022 IMPRESSION: 1. Interval involution and morphology change of the left inferior thyroid nodule. On today's examination, this nodule no longer meets criteria for continued imaging  surveillance. No further follow-up recommended. 2. Interval involution of the previously identified right inferior thyroid nodule. The nodule is no longer visible and has likely resolved. 3. Additional nodules again noted incidentally which do not meet criteria for biopsy or imaging surveillance.  Assessment & Plan:   1. Multinodular goiter 2.  Hypothyroidism 3.  Hyperlipidemia  Her previsit thyroid function tests are consistent with appropriate replacement.  She is advised to continue levothyroxine 25 mcg p.o. daily before breakfast.     - We discussed about the correct intake of her thyroid hormone, on empty stomach at fasting, with water, separated by at least 30 minutes from breakfast and other medications,  and separated by more than 4 hours from calcium, iron, multivitamins, acid reflux medications (PPIs). -Patient is made aware of the fact that thyroid hormone replacement is needed for life, dose to be adjusted by periodic monitoring of thyroid function tests.   - Based on reviews of her previous thyroid ultrasound reports, she has multinodular goiter, stable over time.    -She is not clinically symptomatic of mass-effect at this time.     Regarding her dyslipidemia: She is advised to continue Crestor 10 mg p.o. nightly.  This patient will benefit from whole food plant-based diet which is discussed and recommended to her.  Most recent labs show LDL of 114 increasing from 120 her last measurement.   - I advised her  to maintain close follow up with [name], MD for primary care needs.    I spent  21  minutes in the care of the patient today including review of labs from Thyroid Function, CMP, and other relevant labs ; imaging/biopsy records (current and previous including abstractions from other facilities); face-to-face time discussing  her lab results and symptoms, medications doses, her options of short and long term treatment based on the latest standards of care /  guidelines;   and documenting the encounter.  Debra [name]  participated in the discussions, expressed understanding, and voiced agreement with the above plans.  All questions were answered to her satisfaction. she is encouraged to contact clinic should she have any questions or concerns prior to her return visit.     Follow up plan: Return in about 6 months (around 07/23/2023) for F/U with Pre-visit Labs.   Glade Lloyd, MD [facility] [address] [place], [place] [address] Phone: [phone]  Fax: [phone]     01/22/2023, 1:27 PM  This note was partially dictated with voice recognition software. Similar sounding words can be transcribed inadequately or may not  be corrected upon review.

## 2023-02-08 ENCOUNTER — Other Ambulatory Visit: Payer: Self-pay | Admitting: Family Medicine

## 2023-02-27 ENCOUNTER — Other Ambulatory Visit: Payer: Self-pay | Admitting: "Endocrinology

## 2023-02-27 DIAGNOSIS — E039 Hypothyroidism, unspecified: Secondary | ICD-10-CM

## 2023-03-03 ENCOUNTER — Other Ambulatory Visit: Payer: Self-pay | Admitting: Family Medicine

## 2023-03-04 NOTE — Telephone Encounter (Signed)
Requested Prescriptions  Pending Prescriptions Disp Refills   buPROPion (WELLBUTRIN XL) 300 MG 24 hr tablet [Pharmacy Med Name: BUPROPION HCL XL 300 MG TABLET] 90 tablet 0    Sig: TAKE ONE TABLET BY MOUTH ONCE DAILY     Psychiatry: Antidepressants - bupropion Failed - 03/03/2023  1:32 PM      Failed - Cr in normal range and within 360 days    Creat  Date Value Ref Range Status  11/13/2022 1.05 (H) 0.60 - 1.00 mg/dL Final         Failed - Valid encounter within last 6 months    Recent Outpatient Visits           10 months ago Subacute cough   [place] Dennard Schaumann, Cammie Mcgee, MD   1 year ago Lipoma of torso   [place] [name], MD   1 year ago Routine general medical examination at a health care facility   [place], Cammie Mcgee, MD   1 year ago Pure hypercholesterolemia   [place] [name], MD   2 years ago [place] Eulogio Bear, NP              Passed - AST in normal range and within 360 days    AST  Date Value Ref Range Status  11/13/2022 17 10 - 35 U/L Final         Passed - ALT in normal range and within 360 days    ALT  Date Value Ref Range Status  11/13/2022 19 6 - 29 U/L Final         Passed - Completed PHQ-2 or PHQ-9 in the last 360 days      Passed - Last BP in normal range    BP Readings from Last 1 Encounters:  01/22/23 112/70

## 2023-03-23 ENCOUNTER — Other Ambulatory Visit (HOSPITAL_COMMUNITY): Payer: Self-pay | Admitting: Family Medicine

## 2023-03-23 DIAGNOSIS — Z1231 Encounter for screening mammogram for malignant neoplasm of breast: Secondary | ICD-10-CM

## 2023-04-10 ENCOUNTER — Other Ambulatory Visit: Payer: Self-pay | Admitting: Family Medicine

## 2023-04-15 ENCOUNTER — Other Ambulatory Visit: Payer: Self-pay | Admitting: Family Medicine

## 2023-04-23 ENCOUNTER — Ambulatory Visit (HOSPITAL_COMMUNITY)
Admission: RE | Admit: 2023-04-23 | Discharge: 2023-04-23 | Disposition: A | Discharge status: Home / Self Care | Payer: Medicare PPO | Source: Ambulatory Visit | Attending: Family Medicine | Admitting: Family Medicine

## 2023-04-23 DIAGNOSIS — Z1231 Encounter for screening mammogram for malignant neoplasm of breast: Secondary | ICD-10-CM | POA: Insufficient documentation

## 2023-04-28 ENCOUNTER — Ambulatory Visit (HOSPITAL_COMMUNITY)
Admission: RE | Admit: 2023-04-28 | Discharge: 2023-04-28 | Disposition: A | Discharge status: Home / Self Care | Payer: Medicare PPO | Source: Ambulatory Visit | Attending: Family Medicine | Admitting: Family Medicine

## 2023-04-28 DIAGNOSIS — Z1231 Encounter for screening mammogram for malignant neoplasm of breast: Secondary | ICD-10-CM | POA: Diagnosis not present

## 2023-05-12 ENCOUNTER — Other Ambulatory Visit: Payer: Medicare PPO

## 2023-05-12 DIAGNOSIS — E7800 Pure hypercholesterolemia, unspecified: Secondary | ICD-10-CM | POA: Diagnosis not present

## 2023-05-12 DIAGNOSIS — I10 Essential (primary) hypertension: Secondary | ICD-10-CM | POA: Diagnosis not present

## 2023-05-12 DIAGNOSIS — E039 Hypothyroidism, unspecified: Secondary | ICD-10-CM

## 2023-05-13 LAB — COMPLETE METABOLIC PANEL WITH GFR
AG Ratio: 2 (calc) (ref 1.0–2.5)
ALT: 17 U/L (ref 6–29)
AST: 16 U/L (ref 10–35)
Albumin: 4.5 g/dL (ref 3.6–5.1)
Alkaline phosphatase (APISO): 55 U/L (ref 37–153)
BUN: 24 mg/dL (ref 7–25)
CO2: 24 mmol/L (ref 20–32)
Calcium: 9.4 mg/dL (ref 8.6–10.4)
Chloride: 106 mmol/L (ref 98–110)
Creat: 0.96 mg/dL (ref 0.60–1.00)
Globulin: 2.3 g/dL (calc) (ref 1.9–3.7)
Glucose, Bld: 95 mg/dL (ref 65–99)
Potassium: 4.6 mmol/L (ref 3.5–5.3)
Sodium: 140 mmol/L (ref 135–146)
Total Bilirubin: 0.6 mg/dL (ref 0.2–1.2)
Total Protein: 6.8 g/dL (ref 6.1–8.1)
eGFR: 63 mL/min/{1.73_m2} (ref >=60)

## 2023-05-13 LAB — CBC WITH DIFFERENTIAL/PLATELET
Absolute Monocytes: 585 cells/uL (ref 200–950)
Basophils Absolute: 59 cells/uL (ref 0–200)
Basophils Relative: 0.9 %
Eosinophils Absolute: 78 cells/uL (ref 15–500)
Eosinophils Relative: 1.2 %
HCT: 45.9 % — ABNORMAL HIGH (ref 35.0–45.0)
Hemoglobin: 15.1 g/dL (ref 11.7–15.5)
Lymphs Abs: 1859 cells/uL (ref 850–3900)
MCH: 30.8 pg (ref 27.0–33.0)
MCHC: 32.9 g/dL (ref 32.0–36.0)
MCV: 93.5 fL (ref 80.0–100.0)
MPV: 11 fL (ref 7.5–12.5)
Monocytes Relative: 9 %
Neutro Abs: 3920 cells/uL (ref 1500–7800)
Neutrophils Relative %: 60.3 %
Platelets: 210 10*3/uL (ref 140–400)
RBC: 4.91 10*6/uL (ref 3.80–5.10)
RDW: 12.8 % (ref 11.0–15.0)
Total Lymphocyte: 28.6 %
WBC: 6.5 10*3/uL (ref 3.8–10.8)

## 2023-05-13 LAB — TSH: TSH: 3.43 mIU/L (ref 0.40–4.50)

## 2023-05-13 LAB — LIPID PANEL
Cholesterol: 164 mg/dL (ref <200)
HDL: 67 mg/dL (ref >=50)
LDL Cholesterol (Calc): 78 mg/dL (calc)
Non-HDL Cholesterol (Calc): 97 mg/dL (calc) (ref <130)
Total CHOL/HDL Ratio: 2.4 (calc) (ref <5.0)
Triglycerides: 105 mg/dL (ref <150)

## 2023-05-17 ENCOUNTER — Other Ambulatory Visit: Payer: Self-pay | Admitting: Family Medicine

## 2023-05-17 ENCOUNTER — Encounter: Payer: Self-pay | Admitting: Family Medicine

## 2023-05-17 ENCOUNTER — Ambulatory Visit: Payer: Medicare PPO | Admitting: Family Medicine

## 2023-05-17 VITALS — BP 126/74 | HR 52 | Temp 97.8°F | Ht 65.0 in | Wt 183.4 lb

## 2023-05-17 DIAGNOSIS — E7800 Pure hypercholesterolemia, unspecified: Secondary | ICD-10-CM | POA: Diagnosis not present

## 2023-05-17 MED ORDER — TRIAMCINOLONE ACETONIDE 0.1 % EX CREA: 1.0000 | TOPICAL_CREAM | Freq: Two times a day (BID) | CUTANEOUS | 1 refills | Status: DC

## 2023-05-17 MED ORDER — CLOTRIMAZOLE-BETAMETHASONE 1-0.05 % EX CREA: 1.0000 | TOPICAL_CREAM | Freq: Every day | CUTANEOUS | 1 refills | Status: DC

## 2023-05-17 MED ORDER — ESCITALOPRAM OXALATE 10 MG PO TABS
10.0000 mg | ORAL_TABLET | Freq: Every day | ORAL | 3 refills | Status: DC
Start: 2023-05-17 — End: 2024-02-21

## 2023-05-17 NOTE — Progress Notes (Signed)
Subjective:    Patient ID: Debra [name], female    DOB: Oct 05, 1951, 72 y.o.   MRN: [id number]  HPI Patient is here today for follow-up of her hyperlipidemia.  Lab work below looks outstanding.  However she continues to deal with tremendous stress.  She is not sleeping at night.  She is playing pickle ball every day.  She is walking miles a day.  She is trying to keep yourself active to control anxiety.  Anxiety stems from the custody battle.  She is trying to obtain legal custody of her grandchild.  The biological mother is not providing a safe or supportive environment for the child.  This has the patient extremely worried.  She has been on Wellbutrin for many many years.  Although she is not reporting depression today, she does report a generalized sense of anxiety 24/7 Lab on 05/12/2023  Component Date Value Ref Range Status   WBC 05/12/2023 6.5  3.8 - 10.8 Thousand/uL Final   RBC 05/12/2023 4.91  3.80 - 5.10 Million/uL Final   Hemoglobin 05/12/2023 15.1  11.7 - 15.5 g/dL Final   HCT 69/62/9528 45.9 (H)  35.0 - 45.0 % Final   MCV 05/12/2023 93.5  80.0 - 100.0 fL Final   MCH 05/12/2023 30.8  27.0 - 33.0 pg Final   MCHC 05/12/2023 32.9  32.0 - 36.0 g/dL Final   RDW 41/32/4401 12.8  11.0 - 15.0 % Final   Platelets 05/12/2023 210  140 - 400 Thousand/uL Final   MPV 05/12/2023 11.0  7.5 - 12.5 fL Final   Neutro Abs 05/12/2023 3,920  1,500 - 7,800 cells/uL Final   Lymphs Abs 05/12/2023 1,859  850 - 3,900 cells/uL Final   Absolute Monocytes 05/12/2023 585  200 - 950 cells/uL Final   Eosinophils Absolute 05/12/2023 78  15 - 500 cells/uL Final   Basophils Absolute 05/12/2023 59  0 - 200 cells/uL Final   Neutrophils Relative % 05/12/2023 60.3  % Final   Total Lymphocyte 05/12/2023 28.6  % Final   Monocytes Relative 05/12/2023 9.0  % Final   Eosinophils Relative 05/12/2023 1.2  % Final   Basophils Relative 05/12/2023 0.9  % Final   Glucose, Bld 05/12/2023 95  65 - 99 mg/dL Final   Comment:  .            Fasting reference interval .    BUN 05/12/2023 24  7 - 25 mg/dL Final   Creat 02/72/5366 0.96  0.60 - 1.00 mg/dL Final   eGFR 44/02/4741 63  > OR = 60 mL/min/1.11m2 Final   BUN/Creatinine Ratio 05/12/2023 SEE NOTE:  6 - 22 (calc) Final   Comment:    Not Reported: BUN and Creatinine are within    reference range. .    Sodium 05/12/2023 140  135 - 146 mmol/L Final   Potassium 05/12/2023 4.6  3.5 - 5.3 mmol/L Final   Chloride 05/12/2023 106  98 - 110 mmol/L Final   CO2 05/12/2023 24  20 - 32 mmol/L Final   Calcium 05/12/2023 9.4  8.6 - 10.4 mg/dL Final   Total Protein 59/56/3875 6.8  6.1 - 8.1 g/dL Final   Albumin 64/33/2951 4.5  3.6 - 5.1 g/dL Final   Globulin 88/41/6606 2.3  1.9 - 3.7 g/dL (calc) Final   AG Ratio 05/12/2023 2.0  1.0 - 2.5 (calc) Final   Total Bilirubin 05/12/2023 0.6  0.2 - 1.2 mg/dL Final   Alkaline phosphatase (APISO) 05/12/2023 55  37 - 153  U/L Final   AST 05/12/2023 16  10 - 35 U/L Final   ALT 05/12/2023 17  6 - 29 U/L Final   Cholesterol 05/12/2023 164  <200 mg/dL Final   HDL 16/09/9603 67  > OR = 50 mg/dL Final   Triglycerides 54/08/8118 105  <150 mg/dL Final   LDL Cholesterol (Calc) 05/12/2023 78  mg/dL (calc) Final   Comment: Reference range: <100 . Desirable range <100 mg/dL for primary prevention;   <70 mg/dL for patients with CHD or diabetic patients  with > or = 2 CHD risk factors. Marland Kitchen LDL-C is now calculated using the Martin-Hopkins  calculation, which is a validated novel method providing  better accuracy than the Friedewald equation in the  estimation of LDL-C.  Horald Pollen et al. Lenox Ahr. 1478;295(62): 2061-2068  (http://education.[facility].com/faq/FAQ164)    Total CHOL/HDL Ratio 05/12/2023 2.4  <1.3 (calc) Final   Non-HDL Cholesterol (Calc) 05/12/2023 97  <130 mg/dL (calc) Final   Comment: For patients with diabetes plus 1 major ASCVD risk  factor, treating to a non-HDL-C goal of <100 mg/dL  (LDL-C of <08 mg/dL) is considered  a therapeutic  option.    TSH 05/12/2023 3.43  0.40 - 4.50 mIU/L Final    Past Medical History:  Diagnosis Date   Diverticulitis    Diverticulosis of colon (without mention of hemorrhage)    Esophageal reflux    Generalized anxiety disorder    Hiatal hernia    Hypertension    Iron deficiency anemia 01/19/2012   Mixed hyperlipidemia    OA (osteoarthritis)    PONV (postoperative nausea and vomiting)    Unspecified hypothyroidism    Past Surgical History:  Procedure Laterality Date   ABDOMINAL HYSTERECTOMY     APPENDECTOMY     BOWEL RESECTION  05/11/2012   Procedure: LOW ANTERIOR BOWEL RESECTION;  Surgeon: Dalia Heading, MD;  Location: [facility];  Service: General;;   CESAREAN SECTION     First one 10 , second on 1984   COLON RESECTION  05/11/2012   Procedure: HAND ASSISTED LAPAROSCOPIC COLON RESECTION;  Surgeon: Dalia Heading, MD;  Location: [facility];  Service: General;  Laterality: N/A;  Laparoscopic Hand Assisted Partial Colectomy;   COLONOSCOPY  2011-2008   Dr. Lovell Sheehan   COLONOSCOPY  03/10/2012   Procedure: COLONOSCOPY;  Surgeon: Malissa Hippo, MD;  Location: [facility];  Service: Endoscopy;  Laterality: N/A;  200   Hysterctomy  1988   TONSILLECTOMY     Current Outpatient Medications on File Prior to Visit  Medication Sig Dispense Refill   amLODipine (NORVASC) 5 MG tablet TAKE (1) TABLET BY MOUTH ONCE DAILY. 30 tablet 0   buPROPion (WELLBUTRIN XL) 300 MG 24 hr tablet TAKE ONE TABLET BY MOUTH ONCE DAILY 90 tablet 0   Cholecalciferol (VITAMIN D3 PO) Take by mouth.     fluticasone (FLONASE) 50 MCG/ACT nasal spray Place 2 sprays into both nostrils daily. 16 g 6   levothyroxine (SYNTHROID) 25 MCG tablet TAKE (1) TABLET BY MOUTH ONCE DAILY. 90 tablet 0   LORazepam (ATIVAN) 0.5 MG tablet Take 1 tablet (0.5 mg total) by mouth every 8 (eight) hours as needed for anxiety. 90 tablet 0   meclizine (ANTIVERT) 12.5 MG tablet Take 1 tablet (12.5 mg total) by mouth 3 (three) times daily as  needed for dizziness. 30 tablet 1   meloxicam (MOBIC) 15 MG tablet Take 1 tablet (15 mg total) by mouth daily. 30 tablet 0   propranolol ER (INDERAL  LA) 80 MG 24 hr capsule TAKE (1) CAPSULE BY MOUTH AT BEDTIME. 90 capsule 0   rosuvastatin (CRESTOR) 10 MG tablet Take 1 tablet (10 mg total) by mouth daily. Discontinue pravastatin 90 tablet 0   SYSTANE ULTRA 0.4-0.3 % SOLN Apply 1 drop to eye 2 (two) times daily.     Calcium Carbonate-Vit D-Min (CALCIUM 1200 PO) Take 1 tablet by mouth daily in the afternoon. (Patient not taking: Reported on 05/17/2023)     No current facility-administered medications on file prior to visit.   Allergies  Allergen Reactions   Codeine Nausea And Vomiting   Morphine And Codeine Nausea And Vomiting   Sulfa Antibiotics Hives and Itching   Social History   Socioeconomic History   Marital status: Married    Spouse name: Not on file   Number of children: Not on file   Years of education: Not on file   Highest education level: Not on file  Occupational History   Not on file  Tobacco Use   Smoking status: Never   Smokeless tobacco: Never  Vaping Use   Vaping Use: Never used  Substance and Sexual Activity   Alcohol use: No   Drug use: No   Sexual activity: Yes    Birth control/protection: Surgical  Other Topics Concern   Not on file  Social History Narrative   Not on file   Social Determinants of Health   Financial Resource Strain: Low Risk  (11/10/2022)   Overall Financial Resource Strain (CARDIA)    Difficulty of Paying Living Expenses: Not hard at all  Food Insecurity: No Food Insecurity (11/10/2022)   Hunger Vital Sign    Worried About Running Out of Food in the Last Year: Never true    Ran Out of Food in the Last Year: Never true  Transportation Needs: No Transportation Needs (11/10/2022)   PRAPARE - Administrator, Civil Service (Medical): No    Lack of Transportation (Non-Medical): No  Physical Activity: Sufficiently Active  (11/10/2022)   Exercise Vital Sign    Days of Exercise per Week: 5 days    Minutes of Exercise per Session: 60 min  Stress: Stress Concern Present (11/10/2022)   [employer] of Occupational Health - Occupational Stress Questionnaire    Feeling of Stress : Very much  Social Connections: Socially Integrated (11/10/2022)   Social Connection and Isolation Panel [NHANES]    Frequency of Communication with Friends and Family: More than three times a week    Frequency of Social Gatherings with Friends and Family: Three times a week    Attends Religious Services: More than 4 times per year    Active Member of Clubs or Organizations: Yes    Attends Banker Meetings: More than 4 times per year    Marital Status: Married  Catering manager Violence: Not At Risk (11/10/2022)   Humiliation, Afraid, Rape, and Kick questionnaire    Fear of Current or Ex-Partner: No    Emotionally Abused: No    Physically Abused: No    Sexually Abused: No      Review of Systems  All other systems reviewed and are negative.      Objective:   Physical Exam Vitals reviewed.  Constitutional:      Appearance: Normal appearance. She is well-developed and normal weight.  Cardiovascular:     Rate and Rhythm: Normal rate and regular rhythm.     Heart sounds: Normal heart sounds. No murmur heard.  No friction rub. No gallop.  Pulmonary:     Effort: Pulmonary effort is normal. No respiratory distress.     Breath sounds: Normal breath sounds. No stridor. No wheezing or rhonchi.  Musculoskeletal:     Right lower leg: No edema.     Left lower leg: No edema.  Neurological:     General: No focal deficit present.     Mental Status: She is alert and oriented to person, place, and time. Mental status is at baseline.     Cranial Nerves: No cranial nerve deficit.           Assessment & Plan:  Pure hypercholesterolemia Patient's lab work is beautiful.  However I recommended that we try adding  Lexapro 10 mg a day to the Wellbutrin for the uncontrolled anxiety.  She still using Xanax as needed panic attacks.  My hope is after 4 to 6 weeks she may see overall improvement in her generalized anxiety with the Lexapro.  If so we may try to wean off Wellbutrin at that point

## 2023-06-01 ENCOUNTER — Telehealth: Payer: Self-pay

## 2023-06-01 NOTE — Telephone Encounter (Signed)
Pt called in to request a refill of this med please:  buPROPion (WELLBUTRIN XL) 300 MG  Pt states that she be out of this med tomorrow 06/02/23  LOV: 05/17/23  PHARMACY: [facility] - [place], [place] - [address], [place] [address] Phone: [phone]  Fax: [phone]    CB#: [phone]

## 2023-06-02 ENCOUNTER — Other Ambulatory Visit: Payer: Self-pay | Admitting: Family Medicine

## 2023-06-02 ENCOUNTER — Other Ambulatory Visit: Payer: Self-pay

## 2023-06-02 DIAGNOSIS — F411 Generalized anxiety disorder: Secondary | ICD-10-CM

## 2023-06-02 DIAGNOSIS — F330 Major depressive disorder, recurrent, mild: Secondary | ICD-10-CM

## 2023-06-02 MED ORDER — BUPROPION HCL ER (XL) 300 MG PO TB24
300.0000 mg | ORAL_TABLET | Freq: Every day | ORAL | 1 refills | Status: DC
Start: 2023-06-02 — End: 2023-11-18

## 2023-06-07 ENCOUNTER — Other Ambulatory Visit: Payer: Self-pay | Admitting: Family Medicine

## 2023-06-23 ENCOUNTER — Other Ambulatory Visit: Payer: Self-pay | Admitting: "Endocrinology

## 2023-06-23 DIAGNOSIS — E039 Hypothyroidism, unspecified: Secondary | ICD-10-CM

## 2023-06-24 DIAGNOSIS — L820 Inflamed seborrheic keratosis: Secondary | ICD-10-CM | POA: Diagnosis not present

## 2023-07-20 DIAGNOSIS — E039 Hypothyroidism, unspecified: Secondary | ICD-10-CM | POA: Diagnosis not present

## 2023-07-30 ENCOUNTER — Ambulatory Visit (INDEPENDENT_AMBULATORY_CARE_PROVIDER_SITE_OTHER): Payer: Medicare PPO | Admitting: "Endocrinology

## 2023-07-30 ENCOUNTER — Encounter: Payer: Self-pay | Admitting: "Endocrinology

## 2023-07-30 VITALS — BP 130/86 | HR 52 | Ht 65.0 in | Wt 186.4 lb

## 2023-07-30 DIAGNOSIS — E039 Hypothyroidism, unspecified: Secondary | ICD-10-CM

## 2023-07-30 DIAGNOSIS — E042 Nontoxic multinodular goiter: Secondary | ICD-10-CM | POA: Diagnosis not present

## 2023-07-30 DIAGNOSIS — E782 Mixed hyperlipidemia: Secondary | ICD-10-CM

## 2023-07-30 NOTE — Progress Notes (Signed)
07/30/2023, 1:45 PM   Endocrinology follow-up note  Subjective:    Patient ID: Debra [name], female    DOB: 1951/04/07, PCP Donita Brooks, MD   Past Medical History:  Diagnosis Date   Diverticulitis    Diverticulosis of colon (without mention of hemorrhage)    Esophageal reflux    Generalized anxiety disorder    Hiatal hernia    Hypertension    Iron deficiency anemia 01/19/2012   Mixed hyperlipidemia    OA (osteoarthritis)    PONV (postoperative nausea and vomiting)    Unspecified hypothyroidism    Past Surgical History:  Procedure Laterality Date   ABDOMINAL HYSTERECTOMY     APPENDECTOMY     BOWEL RESECTION  05/11/2012   Procedure: LOW ANTERIOR BOWEL RESECTION;  Surgeon: Dalia Heading, MD;  Location: [facility];  Service: General;;   CESAREAN SECTION     First one 50 , second on 1984   COLON RESECTION  05/11/2012   Procedure: HAND ASSISTED LAPAROSCOPIC COLON RESECTION;  Surgeon: Dalia Heading, MD;  Location: [facility];  Service: General;  Laterality: N/A;  Laparoscopic Hand Assisted Partial Colectomy;   COLONOSCOPY  2011-2008   Dr. Lovell Sheehan   COLONOSCOPY  03/10/2012   Procedure: COLONOSCOPY;  Surgeon: Malissa Hippo, MD;  [facility];  Service: Endoscopy;  Laterality: N/A;  200   Hysterctomy  1988   TONSILLECTOMY     Social History   Socioeconomic History   Marital status: Married    Spouse name: Not on file   Number of children: Not on file   Years of education: Not on file   Highest education level: Not on file  Occupational History   Not on file  Tobacco Use   Smoking status: Never   Smokeless tobacco: Never  Vaping Use   Vaping status: Never Used  Substance and Sexual Activity   Alcohol use: No   Drug use: No   Sexual activity: Yes    Birth control/protection: Surgical  Other Topics Concern   Not on file  Social History Narrative   Not on file   Social Determinants of Health    Financial Resource Strain: Low Risk  (11/10/2022)   Overall Financial Resource Strain (CARDIA)    Difficulty of Paying Living Expenses: Not hard at all  Food Insecurity: No Food Insecurity (11/10/2022)   Hunger Vital Sign    Worried About Running Out of Food in the Last Year: Never true    Ran Out of Food in the Last Year: Never true  Transportation Needs: No Transportation Needs (11/10/2022)   PRAPARE - Administrator, Civil Service (Medical): No    Lack of Transportation (Non-Medical): No  Physical Activity: Sufficiently Active (11/10/2022)   Exercise Vital Sign    Days of Exercise per Week: 5 days    Minutes of Exercise per Session: 60 min  Stress: Stress Concern Present (11/10/2022)   [employer] of Occupational Health - Occupational Stress Questionnaire    Feeling of Stress : Very much  Social Connections: Socially Integrated (11/10/2022)   Social Connection and Isolation Panel [NHANES]    Frequency of Communication with Friends and Family: More than three times a  week    Frequency of Social Gatherings with Friends and Family: Three times a week    Attends Religious Services: More than 4 times per year    Active Member of Clubs or Organizations: Yes    Attends Engineer, structural: More than 4 times per year    Marital Status: Married   Family History  Problem Relation Age of Onset   Rheum arthritis Mother    Heart disease Mother    Healthy Son    Healthy Son    Anesthesia problems Neg Hx    Hypotension Neg Hx    Malignant hyperthermia Neg Hx    Pseudochol deficiency Neg Hx    Outpatient Encounter Medications as of 07/30/2023  Medication Sig   amLODipine (NORVASC) 5 MG tablet TAKE (1) TABLET BY MOUTH ONCE DAILY.   buPROPion (WELLBUTRIN XL) 300 MG 24 hr tablet Take 1 tablet (300 mg total) by mouth daily.   Calcium Carbonate-Vit D-Min (CALCIUM 1200 PO) Take 1 tablet by mouth daily in the afternoon. (Patient not taking: Reported on 05/17/2023)    Cholecalciferol (VITAMIN D3 PO) Take by mouth.   clotrimazole-betamethasone (LOTRISONE) cream Apply 1 Application topically daily.   escitalopram (LEXAPRO) 10 MG tablet Take 1 tablet (10 mg total) by mouth daily.   fluticasone (FLONASE) 50 MCG/ACT nasal spray Place 2 sprays into both nostrils daily.   levothyroxine (SYNTHROID) 25 MCG tablet TAKE (1) TABLET BY MOUTH ONCE DAILY.   LORazepam (ATIVAN) 0.5 MG tablet Take 1 tablet (0.5 mg total) by mouth every 8 (eight) hours as needed for anxiety.   meclizine (ANTIVERT) 12.5 MG tablet Take 1 tablet (12.5 mg total) by mouth 3 (three) times daily as needed for dizziness.   meloxicam (MOBIC) 15 MG tablet Take 1 tablet (15 mg total) by mouth daily.   propranolol ER (INDERAL LA) 80 MG 24 hr capsule TAKE (1) CAPSULE BY MOUTH AT BEDTIME.   rosuvastatin (CRESTOR) 10 MG tablet Take 1 tablet (10 mg total) by mouth daily. Discontinue pravastatin   scopolamine (TRANSDERM-SCOP) 1 MG/3DAYS PLACE 1 PATCH ONTO THE SKIN EVERY 3 DAYS.   SYSTANE ULTRA 0.4-0.3 % SOLN Apply 1 drop to eye 2 (two) times daily.   triamcinolone cream (KENALOG) 0.1 % Apply 1 Application topically 2 (two) times daily.   No facility-administered encounter medications on file as of 07/30/2023.   ALLERGIES: Allergies  Allergen Reactions   Codeine Nausea And Vomiting   Morphine And Codeine Nausea And Vomiting   Sulfa Antibiotics Hives and Itching    VACCINATION STATUS: Immunization History  Administered Date(s) Administered   COVID-19, mRNA, vaccine(Comirnaty)12 years and older 09/04/2022   Fluad Quad(high Dose 65+) 08/18/2019, 09/13/2020   Influenza Split 10/21/2013   Influenza, High Dose Seasonal PF 09/20/2018   Influenza,inj,Quad PF,6+ Mos 08/19/2015   Influenza-Unspecified 09/12/2007, 09/20/2021, 09/04/2022   PFIZER(Purple Top)SARS-COV-2 Vaccination 01/12/2020, 02/06/2020, 09/05/2020   Pfizer Covid-19 Vaccine Bivalent Booster 36yrs & up 09/20/2021   Pneumococcal Conjugate-13  08/19/2015   Pneumococcal Polysaccharide-23 02/09/2014   RSV,unspecified 09/04/2022   Tdap 05/07/2013   Zoster, Live 07/08/2013    HPI Debra [name] is 72 y.o. female who is returning to follow-up for hypothyroidism, multinodular goiter.   PMD: Donita Brooks, MD. -She is being treated for hypothyroidism and then on observation management for multinodular goiter.    -She remains on low-dose levothyroxine 25 mcg p.o. daily before breakfast with good compliance and consistency.  She continues to tolerate this medication and her previsit thyroid function  tests are consistent with appropriate replacement.     She denies any prior history of thyroid surgery nor ablation.   She reports various forms of thyroid dysfunction in her father members including her mother and 2 aunts.  She denies any family history of thyroid malignancy. She has no new complaints today.  Her previsit labs show improved LDL at 78, progressively improving from 138.  She remains on Crestor 10 mg p.o. nightly.   August 25, 2019 thyroid ultrasound reveals multiple nodules in bilateral thyroid lobes, largest one 1.2 cm in the left lobe, nonsuspicious.  Her October 29, 2020 thyroid ultrasound is also unremarkable.  Her repeat previsit thyroid ultrasound shows involution of most of the nodules with no concerning new findings. -She denies any dysphagia, shortness of breath, nor voice change.  She denies tremors, heat intolerance, palpitations.  Review of Systems Progressive weight loss.  Objective:    BP 130/86   Pulse (!) 52   Ht 5\' 5"  (1.651 m)   Wt 186 lb 6.4 oz (84.6 kg)   BMI 31.02 kg/m   Wt Readings from Last 3 Encounters:  07/30/23 186 lb 6.4 oz (84.6 kg)  05/17/23 183 lb 6.4 oz (83.2 kg)  01/22/23 181 lb 6.4 oz (82.3 kg)    Physical Exam   CMP ( most recent) CMP     Component Value Date/Time   NA 140 05/12/2023 0813   K 4.6 05/12/2023 0813   CL 106 05/12/2023 0813   CO2 24 05/12/2023 0813    GLUCOSE 95 05/12/2023 0813   BUN 24 05/12/2023 0813   CREATININE 0.96 05/12/2023 0813   CALCIUM 9.4 05/12/2023 0813   PROT 6.8 05/12/2023 0813   ALBUMIN 4.3 07/27/2017 0801   ALBUMIN 4.3 07/27/2017 0801   AST 16 05/12/2023 0813   ALT 17 05/12/2023 0813   ALKPHOS 54 07/27/2017 0801   ALKPHOS 54 07/27/2017 0801   BILITOT 0.6 05/12/2023 0813   GFRNONAA 53 (L) 04/29/2021 1259   GFRAA 62 04/29/2021 1259    Lipid Panel     Component Value Date/Time   CHOL 164 05/12/2023 0813   CHOL 192 07/13/2022 0814   TRIG 105 05/12/2023 0813   HDL 67 05/12/2023 0813   HDL 62 07/13/2022 0814   CHOLHDL 2.4 05/12/2023 0813   VLDL 27 07/27/2017 0801   LDLCALC 78 05/12/2023 0813      Lab Results  Component Value Date   TSH 2.730 07/20/2023   TSH 3.43 05/12/2023   TSH 2.810 01/19/2023   TSH 3.540 07/13/2022   TSH 2.280 11/14/2021   TSH 4.010 04/29/2021   TSH 2.370 11/05/2020   TSH 5.26 (H) 09/23/2020   TSH 3.73 05/21/2020   TSH 5.54 (H) 08/16/2019   FREET4 1.46 07/20/2023   FREET4 1.39 01/19/2023   FREET4 1.38 07/13/2022   FREET4 1.50 11/14/2021   FREET4 1.34 04/29/2021   FREET4 1.36 11/05/2020   FREET4 1.2 05/21/2020   FREET4 1.3 08/16/2019   FREET4 1.1 10/02/2016   FREET4 1.3 01/20/2016  August 25, 2019 thyroid ultrasound IMPRESSION: 1. Findings suggestive of multinodular goiter. 2. Nodule #4 which is 1.2 cm meets imaging criteria to recommend a 1 year follow-up as clinically indicated.   October 29, 2020 thyroid ultrasound. Right lobe measures 4.3 cm, left lobe measured 4.4 cm with 1.4 cm left lobe nodule remained stable. IMPRESSION: Multinodular thyroid again demonstrated.   Left inferior thyroid nodule (labeled 4, 1.4 cm, TR 4) and right inferior thyroid nodule (labeled 6, 1.1  cm, TR 4) both meet criteria for surveillance, as designated by the newly established ACR TI-RADS criteria. Surveillance ultrasound study recommended to be performed annually up to 5  years.  Surveillance thyroid/neck ultrasound on December 16, 2022 IMPRESSION: 1. Interval involution and morphology change of the left inferior thyroid nodule. On today's examination, this nodule no longer meets criteria for continued imaging surveillance. No further follow-up recommended. 2. Interval involution of the previously identified right inferior thyroid nodule. The nodule is no longer visible and has likely resolved. 3. Additional nodules again noted incidentally which do not meet criteria for biopsy or imaging surveillance.  Assessment & Plan:   1. Multinodular goiter 2.  Hypothyroidism 3.  Hyperlipidemia  Her previsit thyroid function tests are consistent with appropriate replacement.  She is advised to continue levothyroxine 25 mg p.o. daily before breakfast.     - We discussed about the correct intake of her thyroid hormone, on empty stomach at fasting, with water, separated by at least 30 minutes from breakfast and other medications,  and separated by more than 4 hours from calcium, iron, multivitamins, acid reflux medications (PPIs). -Patient is made aware of the fact that thyroid hormone replacement is needed for life, dose to be adjusted by periodic monitoring of thyroid function tests.  -She is responding to Crestor with improved LDL to 78.  She is advised to continue Crestor 10 mg p.o. nightly.  Side effects and precautions discussed with her.   - Based on reviews of her previous thyroid ultrasound reports, she has multinodular goiter, stable over time.    - I advised her  to maintain close follow up with Donita Brooks, MD for primary care needs.   I spent  22  minutes in the care of the patient today including review of labs from Thyroid Function, CMP, and other relevant labs ; imaging/biopsy records (current and previous including abstractions from other facilities); face-to-face time discussing  her lab results and symptoms, medications doses, her options of  short and long term treatment based on the latest standards of care / guidelines;   and documenting the encounter.  Debra [name]  participated in the discussions, expressed understanding, and voiced agreement with the above plans.  All questions were answered to her satisfaction. she is encouraged to contact clinic should she have any questions or concerns prior to her return visit.   Follow up plan: Return in about 1 year (around 07/29/2024) for Fasting Labs  in AM B4 8.   Marquis Lunch, MD [facility] [address] [place], [place] [address] Phone: [phone]  Fax: [phone]     07/30/2023, 1:45 PM  This note was partially dictated with voice recognition software. Similar sounding words can be transcribed inadequately or may not  be corrected upon review.

## 2023-08-05 ENCOUNTER — Other Ambulatory Visit: Payer: Self-pay | Admitting: Family Medicine

## 2023-08-06 NOTE — Telephone Encounter (Signed)
Requested Prescriptions  Pending Prescriptions Disp Refills   amLODipine (NORVASC) 5 MG tablet [Pharmacy Med Name: AMLODIPINE BESYLATE 5 MG TAB] 90 tablet 0    Sig: TAKE (1) TABLET BY MOUTH ONCE DAILY.     Cardiovascular: Calcium Channel Blockers 2 Failed - 08/05/2023 12:27 PM      Failed - Valid encounter within last 6 months    Recent Outpatient Visits           1 year ago Subacute cough   [facility] Tanya Nones, Priscille Heidelberg, MD   1 year ago Lipoma of torso   [facility] Pickard, Priscille Heidelberg, MD   1 year ago Routine general medical examination at a health care facility   [facility] Pickard, Priscille Heidelberg, MD   2 years ago Pure hypercholesterolemia   [facility] Tanya Nones, Priscille Heidelberg, MD   2 years ago Vertigo   [facility] Cathlean Marseilles A, NP              Passed - Last BP in normal range    BP Readings from Last 1 Encounters:  07/30/23 130/86         Passed - Last Heart Rate in normal range    Pulse Readings from Last 1 Encounters:  07/30/23 (!) 52

## 2023-08-07 ENCOUNTER — Other Ambulatory Visit: Payer: Self-pay | Admitting: Family Medicine

## 2023-08-10 NOTE — Telephone Encounter (Signed)
Requested medication (s) are due for refill today: yes   Requested medication (s) are on the active medication list: yes   Last refill:  05/17/23 #90 0 refills  Future visit scheduled: no   Notes to clinic:  last OV 05/17/23. No refills remain, do you want to refill Rx?     Requested Prescriptions  Pending Prescriptions Disp Refills   propranolol ER (INDERAL LA) 80 MG 24 hr capsule [Pharmacy Med Name: PROPRANOLOL HCL ER 80 CAPS] 90 capsule 0    Sig: TAKE (1) CAPSULE BY MOUTH AT BEDTIME.     Cardiovascular:  Beta Blockers Failed - 08/07/2023 12:34 PM      Failed - Valid encounter within last 6 months    Recent Outpatient Visits           1 year ago Subacute cough   [facility] Tanya Nones, Priscille Heidelberg, MD   1 year ago Lipoma of torso   [facility] Pickard, Priscille Heidelberg, MD   1 year ago Routine general medical examination at a health care facility   [facility] Pickard, Priscille Heidelberg, MD   2 years ago Pure hypercholesterolemia   [facility] Tanya Nones, Priscille Heidelberg, MD   2 years ago Vertigo   [facility] Cathlean Marseilles A, NP              Passed - Last BP in normal range    BP Readings from Last 1 Encounters:  07/30/23 130/86         Passed - Last Heart Rate in normal range    Pulse Readings from Last 1 Encounters:  07/30/23 (!) 52

## 2023-10-09 ENCOUNTER — Other Ambulatory Visit: Payer: Self-pay | Admitting: "Endocrinology

## 2023-10-09 DIAGNOSIS — E039 Hypothyroidism, unspecified: Secondary | ICD-10-CM

## 2023-11-09 ENCOUNTER — Other Ambulatory Visit: Payer: Self-pay | Admitting: Family Medicine

## 2023-11-11 NOTE — Telephone Encounter (Signed)
Requested Prescriptions  Pending Prescriptions Disp Refills   propranolol ER (INDERAL LA) 80 MG 24 hr capsule [Pharmacy Med Name: propranolol ER 80 mg capsule,24 hr,extended release] 90 capsule 0    Sig: TAKE (1) CAPSULE BY MOUTH AT BEDTIME.     Cardiovascular:  Beta Blockers Failed - 11/09/2023  2:57 PM      Failed - Valid encounter within last 6 months    Recent Outpatient Visits           1 year ago Subacute cough   [facility] Tanya Nones, Priscille Heidelberg, MD   1 year ago Lipoma of torso   [facility] Tanya Nones, Priscille Heidelberg, MD   2 years ago Routine general medical examination at a health care facility   [facility] Pickard, Priscille Heidelberg, MD   2 years ago Pure hypercholesterolemia   [facility] Tanya Nones, Priscille Heidelberg, MD   2 years ago Vertigo   [facility] Cathlean Marseilles A, NP              Passed - Last BP in normal range    BP Readings from Last 1 Encounters:  07/30/23 130/86         Passed - Last Heart Rate in normal range    Pulse Readings from Last 1 Encounters:  07/30/23 (!) 52

## 2023-11-18 ENCOUNTER — Ambulatory Visit (INDEPENDENT_AMBULATORY_CARE_PROVIDER_SITE_OTHER): Payer: Medicare PPO | Admitting: *Deleted

## 2023-11-18 ENCOUNTER — Other Ambulatory Visit: Payer: Self-pay | Admitting: Family Medicine

## 2023-11-18 DIAGNOSIS — F411 Generalized anxiety disorder: Secondary | ICD-10-CM

## 2023-11-18 DIAGNOSIS — Z1211 Encounter for screening for malignant neoplasm of colon: Secondary | ICD-10-CM

## 2023-11-18 DIAGNOSIS — F330 Major depressive disorder, recurrent, mild: Secondary | ICD-10-CM

## 2023-11-18 DIAGNOSIS — Z0000 Encounter for general adult medical examination without abnormal findings: Secondary | ICD-10-CM

## 2023-11-18 NOTE — Progress Notes (Signed)
Subjective:   Debra [name] is a 72 y.o. female who presents for Medicare Annual (Subsequent) preventive examination.  Visit Complete: Virtual I connected with  Debra [name] on 11/18/23 by a audio enabled telemedicine application and verified that I am speaking with the correct person using two identifiers.  Patient Location: Home  Provider Location: Home Office  I discussed the limitations of evaluation and management by telemedicine. The patient expressed understanding and agreed to proceed.  Vital Signs: Because this visit was a virtual/telehealth visit, some criteria may be missing or patient reported. Any vitals not documented were not able to be obtained and vitals that have been documented are patient   Cardiac Risk Factors include: advanced age (>49men, >53 women);hypertension;family history of premature cardiovascular disease     Objective:    There were no vitals filed for this visit. There is no height or weight on file to calculate BMI.     11/18/2023   11:03 AM 11/10/2022   12:57 PM 09/23/2020   10:39 AM 10/17/2014    8:09 AM 05/11/2012    1:30 PM 05/06/2012    2:52 PM 03/10/2012    1:12 PM  Advanced Directives  Does Patient Have a Medical Advance Directive? Yes Yes Yes No;Yes Patient has advance directive, copy not in chart Patient has advance directive, copy not in chart Patient does not have advance directive  Type of Advance Directive Healthcare Power of [employer] of [place];Living will   Healthcare Power of [employer] Power of Attorney   Does patient want to make changes to medical advance directive?  No - Patient declined  Yes - information given     Copy of Healthcare Power of Attorney in Chart? Yes - validated most recent copy scanned in chart (See row information) No - copy requested   Copy requested from family Copy requested from family   Pre-existing out of facility DNR order (yellow form or pink MOST form)     No No      Current Medications (verified) Outpatient Encounter Medications as of 11/18/2023  Medication Sig   Cholecalciferol (VITAMIN D3 PO) Take by mouth.   clotrimazole-betamethasone (LOTRISONE) cream Apply 1 Application topically daily.   escitalopram (LEXAPRO) 10 MG tablet Take 1 tablet (10 mg total) by mouth daily.   fluticasone (FLONASE) 50 MCG/ACT nasal spray Place 2 sprays into both nostrils daily.   levothyroxine (SYNTHROID) 25 MCG tablet TAKE (1) TABLET BY MOUTH ONCE DAILY.   LORazepam (ATIVAN) 0.5 MG tablet Take 1 tablet (0.5 mg total) by mouth every 8 (eight) hours as needed for anxiety.   meclizine (ANTIVERT) 12.5 MG tablet Take 1 tablet (12.5 mg total) by mouth 3 (three) times daily as needed for dizziness.   meloxicam (MOBIC) 15 MG tablet Take 1 tablet (15 mg total) by mouth daily.   propranolol ER (INDERAL LA) 80 MG 24 hr capsule TAKE (1) CAPSULE BY MOUTH AT BEDTIME.   rosuvastatin (CRESTOR) 10 MG tablet Take 1 tablet (10 mg total) by mouth daily. Discontinue pravastatin   scopolamine (TRANSDERM-SCOP) 1 MG/3DAYS PLACE 1 PATCH ONTO THE SKIN EVERY 3 DAYS.   SYSTANE ULTRA 0.4-0.3 % SOLN Apply 1 drop to eye 2 (two) times daily.   triamcinolone cream (KENALOG) 0.1 % Apply 1 Application topically 2 (two) times daily.   amLODipine (NORVASC) 5 MG tablet TAKE (1) TABLET BY MOUTH ONCE DAILY.   Calcium Carbonate-Vit D-Min (CALCIUM 1200 PO) Take 1 tablet by mouth daily in the afternoon. (Patient  not taking: Reported on 11/18/2023)   [DISCONTINUED] buPROPion (WELLBUTRIN XL) 300 MG 24 hr tablet Take 1 tablet (300 mg total) by mouth daily.   No facility-administered encounter medications on file as of 11/18/2023.    Allergies (verified) Codeine, Morphine and codeine, and Sulfa antibiotics   History: Past Medical History:  Diagnosis Date   Diverticulitis    Diverticulosis of colon (without mention of hemorrhage)    Esophageal reflux    Generalized anxiety disorder    Hiatal hernia     Hypertension    Iron deficiency anemia 01/19/2012   Mixed hyperlipidemia    OA (osteoarthritis)    PONV (postoperative nausea and vomiting)    Unspecified hypothyroidism    Past Surgical History:  Procedure Laterality Date   ABDOMINAL HYSTERECTOMY     APPENDECTOMY     BOWEL RESECTION  05/11/2012   Procedure: LOW ANTERIOR BOWEL RESECTION;  Surgeon: Dalia Heading, MD;  Location: [facility];  Service: General;;   CESAREAN SECTION     First one 89 , second on 1984   COLON RESECTION  05/11/2012   Procedure: HAND ASSISTED LAPAROSCOPIC COLON RESECTION;  Surgeon: Dalia Heading, MD;  Location: [facility];  Service: General;  Laterality: N/A;  Laparoscopic Hand Assisted Partial Colectomy;   COLONOSCOPY  2011-2008   Dr. Lovell Sheehan   COLONOSCOPY  03/10/2012   Procedure: COLONOSCOPY;  Surgeon: Malissa Hippo, MD;  Location: [facility];  Service: Endoscopy;  Laterality: N/A;  200   Hysterctomy  1988   TONSILLECTOMY     Family History  Problem Relation Age of Onset   Rheum arthritis Mother    Heart disease Mother    Healthy Son    Healthy Son    Anesthesia problems Neg Hx    Hypotension Neg Hx    Malignant hyperthermia Neg Hx    Pseudochol deficiency Neg Hx    Social History   Socioeconomic History   Marital status: Married    Spouse name: Not on file   Number of children: Not on file   Years of education: Not on file   Highest education level: Not on file  Occupational History   Not on file  Tobacco Use   Smoking status: Never   Smokeless tobacco: Never  Vaping Use   Vaping status: Never Used  Substance and Sexual Activity   Alcohol use: No   Drug use: No   Sexual activity: Yes    Birth control/protection: Surgical  Other Topics Concern   Not on file  Social History Narrative   Not on file   Social Drivers of Health   Financial Resource Strain: Low Risk  (11/18/2023)   Overall Financial Resource Strain (CARDIA)    Difficulty of Paying Living Expenses: Not hard at all  Food  Insecurity: No Food Insecurity (11/18/2023)   Hunger Vital Sign    Worried About Running Out of Food in the Last Year: Never true    Ran Out of Food in the Last Year: Never true  Transportation Needs: No Transportation Needs (11/18/2023)   PRAPARE - Administrator, Civil Service (Medical): No    Lack of Transportation (Non-Medical): No  Physical Activity: Sufficiently Active (11/18/2023)   Exercise Vital Sign    Days of Exercise per Week: 4 days    Minutes of Exercise per Session: 50 min  Stress: No Stress Concern Present (11/18/2023)   [employer] of Occupational Health - Occupational Stress Questionnaire    Feeling of  Stress : Not at all  Social Connections: Moderately Integrated (11/18/2023)   Social Connection and Isolation Panel [NHANES]    Frequency of Communication with Friends and Family: More than three times a week    Frequency of Social Gatherings with Friends and Family: More than three times a week    Attends Religious Services: Never    Database administrator or Organizations: Yes    Attends Engineer, structural: More than 4 times per year    Marital Status: Married    Tobacco Counseling Counseling given: Not Answered   Clinical Intake:  Pre-visit preparation completed: Yes  Pain : No/denies pain     Diabetes: No  How often do you need to have someone help you when you read instructions, pamphlets, or other written materials from your doctor or pharmacy?: 1 - Never  Interpreter Needed?: No  Information entered by :: Remi Haggard lpn   Activities of Daily Living    11/18/2023   11:08 AM  In your present state of health, do you have any difficulty performing the following activities:  Hearing? 0  Vision? 0  Difficulty concentrating or making decisions? 0  Walking or climbing stairs? 0  Dressing or bathing? 0  Doing errands, shopping? 0  Preparing Food and eating ? N  Using the Toilet? N  In the past six months, have you  accidently leaked urine? N  Do you have problems with loss of bowel control? N  Managing your Medications? N  Managing your Finances? N  Housekeeping or managing your Housekeeping? N    Patient Care Team: Donita Brooks, MD as PCP - General (Family Medicine)  Indicate any recent Medical Services you may have received from other than [facility] providers in the past year (date may be approximate).     Assessment:   This is a routine wellness examination for Debra [name].  Hearing/Vision screen Hearing Screening - Comments:: No trouble hearing Vision Screening - Comments:: Up to date  [facility] Cataracts removed 2 years   Goals Addressed             This Visit's Progress    Patient Stated       Continue Current lifestyle       Depression Screen    11/18/2023   11:07 AM 11/13/2022   11:35 AM 11/10/2022   11:59 AM 11/06/2021    3:01 PM 04/29/2021   12:30 PM 09/23/2020   10:36 AM 03/01/2020   12:15 PM  PHQ 2/9 Scores  PHQ - 2 Score 0 3 0 0 0 1 1  PHQ- 9 Score 0 15  1 0 3 1    Fall Risk    11/18/2023   11:03 AM 11/18/2023   11:02 AM 11/13/2022   10:10 AM 11/10/2022   11:58 AM 11/06/2021    3:01 PM  Fall Risk   Falls in the past year? 0 0 0 0 0  Number falls in past yr: 0 0 0 0 0  Injury with Fall? 0 0 0 0 0  Risk for fall due to :   No Fall Risks    Follow up Falls evaluation completed;Education provided;Falls prevention discussed Falls evaluation completed;Education provided;Falls prevention discussed Falls prevention discussed Falls evaluation completed;Education provided;Falls prevention discussed     MEDICARE RISK AT HOME: Medicare Risk at Home Any stairs in or around the home?: Yes If so, are there any without handrails?: No Home free of loose throw rugs in walkways, pet  beds, electrical cords, etc?: Yes Adequate lighting in your home to reduce risk of falls?: Yes Life alert?: No Use of a cane, walker or w/c?: No Grab bars in the bathroom?: Yes Shower  chair or bench in shower?: Yes Elevated toilet seat or a handicapped toilet?: Yes  TIMED UP AND GO:  Was the test performed?  No    Cognitive Function:        11/18/2023   11:05 AM 11/10/2022   12:58 PM  6CIT Screen  What Year? 0 points 0 points  What month? 0 points 0 points  What time? 0 points 0 points  Count back from 20 0 points 0 points  Months in reverse 0 points 0 points  Repeat phrase 2 points 0 points  Total Score 2 points 0 points    Immunizations Immunization History  Administered Date(s) Administered   Fluad Quad(high Dose 65+) 08/18/2019, 09/13/2020, 08/25/2023   Influenza Split 10/21/2013   Influenza, High Dose Seasonal PF 09/20/2018   Influenza,inj,Quad PF,6+ Mos 08/19/2015   Influenza-Unspecified 09/12/2007, 09/20/2021, 09/04/2022   PFIZER(Purple Top)SARS-COV-2 Vaccination 01/12/2020, 02/06/2020, 09/05/2020, 08/25/2023   Pfizer Covid-19 Vaccine Bivalent Booster 57yrs & up 09/20/2021   Pfizer(Comirnaty)Fall Seasonal Vaccine 12 years and older 09/04/2022   Pneumococcal Conjugate-13 08/19/2015   Pneumococcal Polysaccharide-23 02/09/2014   RSV,unspecified 09/04/2022   Tdap 05/07/2013   Zoster, Live 07/08/2013    TDAP status: Due, Education has been provided regarding the importance of this vaccine. Advised may receive this vaccine at local pharmacy or Health Dept. Aware to provide a copy of the vaccination record if obtained from local pharmacy or Health Dept. Verbalized acceptance and understanding.  Flu Vaccine status: Up to date  Pneumococcal vaccine status: Up to date Patient stated done unable to verify  Covid-19 vaccine status: Information provided on how to obtain vaccines.   Qualifies for Shingles Vaccine? No   Zostavax completed Yes   Shingrix Completed?: Yes  Screening Tests Health Maintenance  Topic Date Due   Colonoscopy  12/19/2023 (Originally 03/10/2022)   Zoster Vaccines- Shingrix (1 of 2) 02/16/2024 (Originally 08/17/2000)    DTaP/Tdap/Td (2 - Td or Tdap) 11/17/2024 (Originally 05/08/2023)   Pneumonia Vaccine 70+ Years old (3 of 3 - PPSV23 or PCV20) 11/23/2024 (Originally 08/18/2020)   COVID-19 Vaccine (7 - 2024-25 season) 11/23/2024 (Originally 10/20/2023)   Medicare Annual Wellness (AWV)  11/17/2024   MAMMOGRAM  04/27/2025   INFLUENZA VACCINE  Completed   DEXA SCAN  Completed   Hepatitis C Screening  Completed   HPV VACCINES  Aged Out    Health Maintenance  There are no preventive care reminders to display for this patient.   Colorectal cancer screening: Type of screening: Colonoscopy. Completed 2013. Repeat every 10 years  Mammogram status: Completed  . Repeat every year  Bone Density status: Completed 2018. Results reflect: Bone density results: NORMAL. Repeat every 10 years.  Lung Cancer Screening: (Low Dose CT Chest recommended if Age 96-80 years, 20 pack-year currently smoking OR have quit w/in 15years.) does not qualify.   Lung Cancer Screening Referral:   Additional Screening:  Hepatitis C Screening: does not qualify; Completed 2021  Vision Screening: Recommended annual ophthalmology exams for early detection of glaucoma and other disorders of the eye. Is the patient up to date with their annual eye exam?  Yes  Who is the provider or what is the name of the office in which the patient attends annual eye exams? [facility] If pt is not established with a  provider, would they like to be referred to a provider to establish care? No .   Dental Screening: Recommended annual dental exams for proper oral hygiene   Community Resource Referral / Chronic Care Management: CRR required this visit?  No   CCM required this visit?  No     Plan:     I have personally reviewed and noted the following in the patient's chart:   Medical and social history Use of alcohol, tobacco or illicit drugs  Current medications and supplements including opioid prescriptions. Patient is not currently taking  opioid prescriptions. Functional ability and status Nutritional status Physical activity Advanced directives List of other physicians Hospitalizations, surgeries, and ER visits in previous 12 months Vitals Screenings to include cognitive, depression, and falls Referrals and appointments  In addition, I have reviewed and discussed with patient certain preventive protocols, quality metrics, and best practice recommendations. A written personalized care plan for preventive services as well as general preventive health recommendations were provided to patient.     Remi Haggard, LPN   [id number]/[id number]   After Visit Summary: (MyChart) Due to this being a telephonic visit, the after visit summary with patients personalized plan was offered to patient via MyChart   Nurse Notes:

## 2023-11-18 NOTE — Patient Instructions (Signed)
Debra [name] , Thank you for taking time to come for your Medicare Wellness Visit. I appreciate your ongoing commitment to your health goals. Please review the following plan we discussed and let me know if I can assist you in the future.   Screening recommendations/referrals: Colonoscopy: Education provided Mammogram: up to date Bone Density: up to date Recommended yearly ophthalmology/optometry visit for glaucoma screening and checkup Recommended yearly dental visit for hygiene and checkup  Vaccinations: Influenza vaccine: up to date Pneumococcal vaccine: up to date Tdap vaccine:  Education provided Shingles vaccine: up to date     Preventive Care 65 Years and Older, Female Preventive care refers to lifestyle choices and visits with your health care provider that can promote health and wellness. What does preventive care include? A yearly physical exam. This is also called an annual well check. Dental exams once or twice a year. Routine eye exams. Ask your health care provider how often you should have your eyes checked. Personal lifestyle choices, including: Daily care of your teeth and gums. Regular physical activity. Eating a healthy diet. Avoiding tobacco and drug use. Limiting alcohol use. Practicing safe sex. Taking low-dose aspirin every day. Taking vitamin and mineral supplements as recommended by your health care provider. What happens during an annual well check? The services and screenings done by your health care provider during your annual well check will depend on your age, overall health, lifestyle risk factors, and family history of disease. Counseling  Your health care provider may ask you questions about your: Alcohol use. Tobacco use. Drug use. Emotional well-being. Home and relationship well-being. Sexual activity. Eating habits. History of falls. Memory and ability to understand (cognition). Work and work Astronomer. Reproductive health. Screening   You may have the following tests or measurements: Height, weight, and BMI. Blood pressure. Lipid and cholesterol levels. These may be checked every 5 years, or more frequently if you are over 28 years old. Skin check. Lung cancer screening. You may have this screening every year starting at age 59 if you have a 30-pack-year history of smoking and currently smoke or have quit within the past 15 years. Fecal occult blood test (FOBT) of the stool. You may have this test every year starting at age 16. Flexible sigmoidoscopy or colonoscopy. You may have a sigmoidoscopy every 5 years or a colonoscopy every 10 years starting at age 15. Hepatitis C blood test. Hepatitis B blood test. Sexually transmitted disease (STD) testing. Diabetes screening. This is done by checking your blood sugar (glucose) after you have not eaten for a while (fasting). You may have this done every 1-3 years. Bone density scan. This is done to screen for osteoporosis. You may have this done starting at age 57. Mammogram. This may be done every 1-2 years. Talk to your health care provider about how often you should have regular mammograms. Talk with your health care provider about your test results, treatment options, and if necessary, the need for more tests. Vaccines  Your health care provider may recommend certain vaccines, such as: Influenza vaccine. This is recommended every year. Tetanus, diphtheria, and acellular pertussis (Tdap, Td) vaccine. You may need a Td booster every 10 years. Zoster vaccine. You may need this after age 5. Pneumococcal 13-valent conjugate (PCV13) vaccine. One dose is recommended after age 34. Pneumococcal polysaccharide (PPSV23) vaccine. One dose is recommended after age 57. Talk to your health care provider about which screenings and vaccines you need and how often you need them. This information is  not intended to replace advice given to you by your health care provider. Make sure you discuss  any questions you have with your health care provider. Document Released: 12/20/2015 Document Revised: 08/12/2016 Document Reviewed: 09/24/2015 Elsevier Interactive Patient Education  2017 [employer].  Fall Prevention in the Home Falls can cause injuries. They can happen to people of all ages. There are many things you can do to make your home safe and to help prevent falls. What can I do on the outside of my home? Regularly fix the edges of walkways and driveways and fix any cracks. Remove anything that might make you trip as you walk through a door, such as a raised step or threshold. Trim any bushes or trees on the path to your home. Use bright outdoor lighting. Clear any walking paths of anything that might make someone trip, such as rocks or tools. Regularly check to see if handrails are loose or broken. Make sure that both sides of any steps have handrails. Any raised decks and porches should have guardrails on the edges. Have any leaves, snow, or ice cleared regularly. Use sand or salt on walking paths during winter. Clean up any spills in your garage right away. This includes oil or grease spills. What can I do in the bathroom? Use night lights. Install grab bars by the toilet and in the tub and shower. Do not use towel bars as grab bars. Use non-skid mats or decals in the tub or shower. If you need to sit down in the shower, use a plastic, non-slip stool. Keep the floor dry. Clean up any water that spills on the floor as soon as it happens. Remove soap buildup in the tub or shower regularly. Attach bath mats securely with double-sided non-slip rug tape. Do not have throw rugs and other things on the floor that can make you trip. What can I do in the bedroom? Use night lights. Make sure that you have a light by your bed that is easy to reach. Do not use any sheets or blankets that are too big for your bed. They should not hang down onto the floor. Have a firm chair that has  side arms. You can use this for support while you get dressed. Do not have throw rugs and other things on the floor that can make you trip. What can I do in the kitchen? Clean up any spills right away. Avoid walking on wet floors. Keep items that you use a lot in easy-to-reach places. If you need to reach something above you, use a strong step stool that has a grab bar. Keep electrical cords out of the way. Do not use floor polish or wax that makes floors slippery. If you must use wax, use non-skid floor wax. Do not have throw rugs and other things on the floor that can make you trip. What can I do with my stairs? Do not leave any items on the stairs. Make sure that there are handrails on both sides of the stairs and use them. Fix handrails that are broken or loose. Make sure that handrails are as long as the stairways. Check any carpeting to make sure that it is firmly attached to the stairs. Fix any carpet that is loose or worn. Avoid having throw rugs at the top or bottom of the stairs. If you do have throw rugs, attach them to the floor with carpet tape. Make sure that you have a light switch at the top of the  stairs and the bottom of the stairs. If you do not have them, ask someone to add them for you. What else can I do to help prevent falls? Wear shoes that: Do not have high heels. Have rubber bottoms. Are comfortable and fit you well. Are closed at the toe. Do not wear sandals. If you use a stepladder: Make sure that it is fully opened. Do not climb a closed stepladder. Make sure that both sides of the stepladder are locked into place. Ask someone to hold it for you, if possible. Clearly mark and make sure that you can see: Any grab bars or handrails. First and last steps. Where the edge of each step is. Use tools that help you move around (mobility aids) if they are needed. These include: Canes. Walkers. Scooters. Crutches. Turn on the lights when you go into a dark area.  Replace any light bulbs as soon as they burn out. Set up your furniture so you have a clear path. Avoid moving your furniture around. If any of your floors are uneven, fix them. If there are any pets around you, be aware of where they are. Review your medicines with your doctor. Some medicines can make you feel dizzy. This can increase your chance of falling. Ask your doctor what other things that you can do to help prevent falls. This information is not intended to replace advice given to you by your health care provider. Make sure you discuss any questions you have with your health care provider. Document Released: 09/19/2009 Document Revised: 04/30/2016 Document Reviewed: 12/28/2014 Elsevier Interactive Patient Education  2017 [employer].

## 2023-12-09 LAB — COLOGUARD

## 2023-12-26 ENCOUNTER — Other Ambulatory Visit: Payer: Self-pay | Admitting: Family Medicine

## 2023-12-28 DIAGNOSIS — Z1211 Encounter for screening for malignant neoplasm of colon: Secondary | ICD-10-CM | POA: Diagnosis not present

## 2024-01-02 LAB — COLOGUARD: COLOGUARD: NEGATIVE

## 2024-01-31 ENCOUNTER — Other Ambulatory Visit: Payer: Self-pay | Admitting: "Endocrinology

## 2024-01-31 DIAGNOSIS — E039 Hypothyroidism, unspecified: Secondary | ICD-10-CM

## 2024-02-10 ENCOUNTER — Other Ambulatory Visit: Payer: Self-pay | Admitting: Family Medicine

## 2024-02-10 DIAGNOSIS — D2239 Melanocytic nevi of other parts of face: Secondary | ICD-10-CM | POA: Diagnosis not present

## 2024-02-10 DIAGNOSIS — L738 Other specified follicular disorders: Secondary | ICD-10-CM | POA: Diagnosis not present

## 2024-02-10 DIAGNOSIS — L821 Other seborrheic keratosis: Secondary | ICD-10-CM | POA: Diagnosis not present

## 2024-02-10 DIAGNOSIS — L739 Follicular disorder, unspecified: Secondary | ICD-10-CM | POA: Diagnosis not present

## 2024-02-20 ENCOUNTER — Other Ambulatory Visit: Payer: Self-pay | Admitting: Family Medicine

## 2024-02-21 ENCOUNTER — Encounter: Payer: Self-pay | Admitting: Family Medicine

## 2024-02-21 ENCOUNTER — Ambulatory Visit: Admitting: Family Medicine

## 2024-02-21 VITALS — BP 124/80 | HR 54 | Temp 98.6°F | Ht 65.0 in | Wt 187.0 lb

## 2024-02-21 DIAGNOSIS — I10 Essential (primary) hypertension: Secondary | ICD-10-CM | POA: Diagnosis not present

## 2024-02-21 DIAGNOSIS — F411 Generalized anxiety disorder: Secondary | ICD-10-CM | POA: Diagnosis not present

## 2024-02-21 DIAGNOSIS — K219 Gastro-esophageal reflux disease without esophagitis: Secondary | ICD-10-CM | POA: Insufficient documentation

## 2024-02-21 DIAGNOSIS — R0789 Other chest pain: Secondary | ICD-10-CM | POA: Diagnosis not present

## 2024-02-21 DIAGNOSIS — R0602 Shortness of breath: Secondary | ICD-10-CM | POA: Insufficient documentation

## 2024-02-21 DIAGNOSIS — R0609 Other forms of dyspnea: Secondary | ICD-10-CM | POA: Insufficient documentation

## 2024-02-21 HISTORY — DX: Shortness of breath: R06.02

## 2024-02-21 MED ORDER — MELOXICAM 15 MG PO TABS: 15.0000 mg | ORAL_TABLET | Freq: Every day | ORAL | 0 refills | Status: DC

## 2024-02-21 MED ORDER — SERTRALINE HCL 25 MG PO TABS: 25.0000 mg | ORAL_TABLET | Freq: Every day | ORAL | 0 refills | Status: DC

## 2024-02-21 MED ORDER — LORAZEPAM 0.5 MG PO TABS: 0.5000 mg | ORAL_TABLET | Freq: Three times a day (TID) | ORAL | 0 refills | Status: DC | PRN

## 2024-02-21 MED ORDER — PANTOPRAZOLE SODIUM 40 MG PO TBEC: 40.0000 mg | DELAYED_RELEASE_TABLET | Freq: Every day | ORAL | 0 refills | Status: DC

## 2024-02-21 NOTE — Telephone Encounter (Signed)
 Requested medication (s) are due for refill today- yes  Requested medication (s) are on the active medication list -yes  Future visit scheduled -yes  Last refill: 05/17/23 80g 1RF  Notes to clinic: non delegated Rx  Requested Prescriptions  Pending Prescriptions Disp Refills   triamcinolone cream (KENALOG) 0.1 % [Pharmacy Med Name: triamcinolone acetonide 0.1 % topical cream] 80 g 1    Sig: APPLY TO AFFECTED AREA TWICE DAILY.     Not Delegated - Dermatology:  Corticosteroids Failed - 02/21/2024 10:38 AM      Failed - This refill cannot be delegated      Failed - Valid encounter within last 12 months    Recent Outpatient Visits           1 year ago Subacute cough   [facility] Tanya Nones, Priscille Heidelberg, MD   2 years ago Lipoma of torso   [facility] Tanya Nones, Priscille Heidelberg, MD   2 years ago Routine general medical examination at a health care facility   [facility] Pickard, Priscille Heidelberg, MD   2 years ago Pure hypercholesterolemia   [facility] Tanya Nones, Priscille Heidelberg, MD   2 years ago Vertigo   [facility] Valentino Nose, NP                 Requested Prescriptions  Pending Prescriptions Disp Refills   triamcinolone cream (KENALOG) 0.1 % [Pharmacy Med Name: triamcinolone acetonide 0.1 % topical cream] 80 g 1    Sig: APPLY TO AFFECTED AREA TWICE DAILY.     Not Delegated - Dermatology:  Corticosteroids Failed - 02/21/2024 10:38 AM      Failed - This refill cannot be delegated      Failed - Valid encounter within last 12 months    Recent Outpatient Visits           1 year ago Subacute cough   [facility] Tanya Nones, Priscille Heidelberg, MD   2 years ago Lipoma of torso   [facility] Tanya Nones, Priscille Heidelberg, MD   2 years ago Routine general medical examination at a health care facility   [facility] Pickard, Priscille Heidelberg, MD   2 years ago Pure hypercholesterolemia   [facility] Tanya Nones, Priscille Heidelberg, MD   2 years ago Vertigo   [facility] Valentino Nose, NP

## 2024-02-21 NOTE — Progress Notes (Signed)
 Patient Office Visit  Assessment & Plan:  Shortness of breath -     EKG 12-Lead -     Ambulatory referral to Cardiology -     CBC with Differential/Platelet -     COMPLETE METABOLIC PANEL WITH GFR -     TSH  Anxiety state -     Sertraline HCl; Take 1 tablet (25 mg total) by mouth daily.  Dispense: 90 tablet; Refill: 0 -     LORazepam; Take 1 tablet (0.5 mg total) by mouth every 8 (eight) hours as needed for anxiety.  Dispense: 30 tablet; Refill: 0  Primary hypertension -     CBC with Differential/Platelet -     COMPLETE METABOLIC PANEL WITH GFR  Atypical chest pain -     Ambulatory referral to Cardiology -     CBC with Differential/Platelet -     COMPLETE METABOLIC PANEL WITH GFR -     TSH  Gastroesophageal reflux disease without esophagitis -     Pantoprazole Sodium; Take 1 tablet (40 mg total) by mouth daily.  Dispense: 90 tablet; Refill: 0  Other orders -     Meloxicam; Take 1 tablet (15 mg total) by mouth daily.  Dispense: 30 tablet; Refill: 0   EKG today-brady rate 45-48, no acute changes noted.  Will reduce the propranolol to 20 mg once a day.  Patient states she is able to cut her tablet.  It is not a capsule.  If Palpitations increase we may need to consider another type of beta-blocker. Follow up on lab work and notify patient Cardiology consult ordered due to ongoing symptoms and risk factors.  If shortness of breath worsens in any way then she will need to go to ED for further evaluation.  Start Zoloft 25mg  once per day, continue Wellbutrin.  Ativan as needed. Pt will let us know if she would like to see a psychotherapist. Patient will start Protonix 40 mg once a day.  GERD precautions reviewed.  Patient may need to back off on the meloxicam if in fact that is worsening GERD symptoms.  If gerd symptoms do not improve her then will consider GI consult. Return in about 5 weeks (around 03/27/2024), or if symptoms worsen or fail to improve.   Subjective:    Patient  ID: Oneita Hurt, female    DOB: [birth year]  Age: 73 y.o. MRN: [id number]  Chief Complaint  Patient presents with   Shortness of Breath    X 8 months. Has gotten progressively worse.    HPI Intermittent SOB the past 8 months. Pt feels like an elephant on her chest while walking but does Ok when she plays pickle ball. Pt thinks pickleball it is not a continuous activity. Does not wake her up. No nausea vomiting or radiation of the pain.  Patient does not feel winded today.  Patient currently not having chest pain. ONSET: intermittent but not going away.  TIMING: Worse after eating, walking after dinner, usually walks one mile but after walking 1/2 mile she is unable to finish her walk SEVERITY: Moderate CHARACTER: short winded, wheezing while walking. Pt has no hx of asthma but does have GERD (untreated) PRODUCTIVE: No. PROGRESSION: unchanged  TRIGGERS: GERD, not sure, going through a lot of stress, daughter in law left her husband about one year ago. Pt taking care of her grandchildren ASSOCIATED SYMPTOMS: Subjective Fever (No), Chills (No), Chest pain (yes at times), Sore throat (No), Otalgia no), Heartburn (yes), Hoarse voice (no),  Rhinitis (no), Dyspnea (yes), Chest tightness (No) HOME TREATMENT: has not tried anything other than Ativan for anxiety ANTIBIOTICS: No. RISK FACTORS: GERD, HTN,Hyperlipidemia, FH CAD in brother and mother RECENT PFTs: No. Never smoked PALPITATIONS: has been intermittent for some time now.  ONSET: ongoing but intermittent TIMING: Intermittent HISTORY OF PALPITATIONS: CHARACTER: Heart pounding, heart racing.  SEVERITY: Moderate PROGRESSION: has been ongoing for years.  TRIGGERS: anxiety, stress, hx of palpitations RELIEVED BY: Propranolol but has been cutting in 1/2, only taking 40mg   HOME TREATMENT: None ASSOCIATED SYMPTOMS: Chest pain (No), Shortness of breath (yes), Dizziness (No), Change in LOC (No), Anxiety (yes) RECENT MEDICATION CHANGE: No.    GERD-history of gastroesophageal reflux disease (GERD), She reports no chronic sore throat, voice changes, hemoptysis, or hematochezia.  Was told that she had a hole in her esophagus but had no follow-up after that.  Patient has not had an endoscopy.  Patient has not been taking any medicine for GERD but notices that she has symptoms after eating and will have wheezing after she eats.  Occasionally she will also notice wheezing while walking. Pt does take Mobic prn  Anxiety-has had increased anxiety for the past year due to the daughter in law leaving her son and grandchildren without any warning.  Patient has been taking the Wellbutrin XL 300 mg once a day.  Patient did not like the Lexapro because it made her nauseated so she stopped taking that.  Patient has not tried other SSRI, ie Zoloft.  Patient does take the Ativan once a week or less but likes to have it on hand.  Patient has not done any psychotherapy however has been thinking about doing this. Pt did wake up in the middle of the night last night with increased anxiety. Pt has had to keep it together and be strong for her family especially her son and grand kids.  HTN-using antihypertensive medication without difficulty.  Denies associated signs and symptoms including peripheral swelling cramps spasms and palpitations.  Voices understanding of the potential for interference with blood pressure control with substances including high sodium intake, decongestions, herbal supplements weight loss supplements nutritional supplements.  Blood pressures at home are less than 140/90.     The 10-year ASCVD risk score (Arnett DK, et al., 2019) is: 15.5%  Past Medical History:  Diagnosis Date   Diverticulitis    Diverticulosis of colon (without mention of hemorrhage)    Esophageal reflux    Generalized anxiety disorder    Hiatal hernia    Hypertension    Iron deficiency anemia 01/19/2012   Mixed hyperlipidemia    OA (osteoarthritis)    PONV  (postoperative nausea and vomiting)    Shortness of breath 02/21/2024   Unspecified hypothyroidism    Past Surgical History:  Procedure Laterality Date   ABDOMINAL HYSTERECTOMY     APPENDECTOMY     BOWEL RESECTION  05/11/2012   Procedure: LOW ANTERIOR BOWEL RESECTION;  Surgeon: Dalia Heading, MD;  Location: [facility];  Service: General;;   CESAREAN SECTION     First one 58 , second on 1984   COLON RESECTION  05/11/2012   Procedure: HAND ASSISTED LAPAROSCOPIC COLON RESECTION;  Surgeon: Dalia Heading, MD;  Location: [facility];  Service: General;  Laterality: N/A;  Laparoscopic Hand Assisted Partial Colectomy;   COLONOSCOPY  2011-2008   Dr. Lovell Sheehan   COLONOSCOPY  03/10/2012   Procedure: COLONOSCOPY;  Surgeon: Malissa Hippo, MD;  Location: [facility];  Service: Endoscopy;  Laterality:  N/A;  200   Hysterctomy  1988   TONSILLECTOMY     Social History   Tobacco Use   Smoking status: Never   Smokeless tobacco: Never  Vaping Use   Vaping status: Never Used  Substance Use Topics   Alcohol use: No   Drug use: No   Family History  Problem Relation Age of Onset   Rheum arthritis Mother    Heart disease Mother    Heart attack Brother    Healthy Son    Healthy Son    Anesthesia problems Neg Hx    Hypotension Neg Hx    Malignant hyperthermia Neg Hx    Pseudochol deficiency Neg Hx    Allergies  Allergen Reactions   Codeine Nausea And Vomiting   Morphine And Codeine Nausea And Vomiting   Sulfa Antibiotics Hives and Itching    ROS    Objective:    BP 124/80   Pulse (!) 54   Temp 98.6 F (37 C)   Ht 5\' 5"  (1.651 m)   Wt 187 lb (84.8 kg)   SpO2 98%   BMI 31.12 kg/m  BP Readings from Last 3 Encounters:  02/21/24 124/80  07/30/23 130/86  05/17/23 126/74   Wt Readings from Last 3 Encounters:  02/21/24 187 lb (84.8 kg)  07/30/23 186 lb 6.4 oz (84.6 kg)  05/17/23 183 lb 6.4 oz (83.2 kg)    Physical Exam Vitals and nursing note reviewed.  Constitutional:      General:  She is not in acute distress.    Appearance: Normal appearance.  HENT:     Head: Normocephalic.     Right Ear: Tympanic membrane, ear canal and external ear normal.     Left Ear: Tympanic membrane, ear canal and external ear normal.  Eyes:     Extraocular Movements: Extraocular movements intact.     Pupils: Pupils are equal, round, and reactive to light.  Cardiovascular:     Rate and Rhythm: Regular rhythm. Bradycardia present.     Heart sounds: Normal heart sounds.  Pulmonary:     Effort: Pulmonary effort is normal.     Breath sounds: Normal breath sounds.  Musculoskeletal:     Right lower leg: No edema.     Left lower leg: No edema.  Neurological:     General: No focal deficit present.     Mental Status: She is alert and oriented to person, place, and time.  Psychiatric:        Mood and Affect: Mood is anxious.        Behavior: Behavior normal.        Thought Content: Thought content normal.        Judgment: Judgment normal.      No results found for any visits on 02/21/24.

## 2024-02-22 ENCOUNTER — Encounter: Payer: Self-pay | Admitting: Family Medicine

## 2024-02-22 LAB — CBC WITH DIFFERENTIAL/PLATELET
Absolute Lymphocytes: 1888 {cells}/uL (ref 850–3900)
Absolute Monocytes: 525 {cells}/uL (ref 200–950)
Basophils Absolute: 53 {cells}/uL (ref 0–200)
Basophils Relative: 0.9 %
Eosinophils Absolute: 112 {cells}/uL (ref 15–500)
Eosinophils Relative: 1.9 %
HCT: 44.4 % (ref 35.0–45.0)
Hemoglobin: 14.8 g/dL (ref 11.7–15.5)
MCH: 30.5 pg (ref 27.0–33.0)
MCHC: 33.3 g/dL (ref 32.0–36.0)
MCV: 91.5 fL (ref 80.0–100.0)
MPV: 11.2 fL (ref 7.5–12.5)
Monocytes Relative: 8.9 %
Neutro Abs: 3322 {cells}/uL (ref 1500–7800)
Neutrophils Relative %: 56.3 %
Platelets: 216 10*3/uL (ref 140–400)
RBC: 4.85 10*6/uL (ref 3.80–5.10)
RDW: 12.8 % (ref 11.0–15.0)
Total Lymphocyte: 32 %
WBC: 5.9 10*3/uL (ref 3.8–10.8)

## 2024-02-22 LAB — COMPLETE METABOLIC PANEL WITH GFR
AG Ratio: 1.7 (calc) (ref 1.0–2.5)
ALT: 17 U/L (ref 6–29)
AST: 16 U/L (ref 10–35)
Albumin: 4.3 g/dL (ref 3.6–5.1)
Alkaline phosphatase (APISO): 49 U/L (ref 37–153)
BUN/Creatinine Ratio: 19 (calc) (ref 6–22)
BUN: 22 mg/dL (ref 7–25)
CO2: 26 mmol/L (ref 20–32)
Calcium: 9.5 mg/dL (ref 8.6–10.4)
Chloride: 105 mmol/L (ref 98–110)
Creat: 1.17 mg/dL — ABNORMAL HIGH (ref 0.60–1.00)
Globulin: 2.6 g/dL (ref 1.9–3.7)
Glucose, Bld: 96 mg/dL (ref 65–99)
Potassium: 4.5 mmol/L (ref 3.5–5.3)
Sodium: 139 mmol/L (ref 135–146)
Total Bilirubin: 0.5 mg/dL (ref 0.2–1.2)
Total Protein: 6.9 g/dL (ref 6.1–8.1)
eGFR: 49 mL/min/{1.73_m2} — ABNORMAL LOW (ref >=60)

## 2024-02-22 LAB — TSH: TSH: 2.75 m[IU]/L (ref 0.40–4.50)

## 2024-03-27 ENCOUNTER — Encounter: Payer: Self-pay | Admitting: Family Medicine

## 2024-03-27 ENCOUNTER — Ambulatory Visit: Admitting: Family Medicine

## 2024-03-27 VITALS — BP 122/76 | HR 50 | Temp 97.7°F | Ht 65.0 in | Wt 178.0 lb

## 2024-03-27 DIAGNOSIS — Z23 Encounter for immunization: Secondary | ICD-10-CM

## 2024-03-27 DIAGNOSIS — F4321 Adjustment disorder with depressed mood: Secondary | ICD-10-CM | POA: Diagnosis not present

## 2024-03-27 MED ORDER — BUPROPION HCL ER (XL) 150 MG PO TB24: 150.0000 mg | ORAL_TABLET | Freq: Every day | ORAL | 3 refills | Status: DC

## 2024-03-27 NOTE — Progress Notes (Signed)
 Subjective:    Patient ID: Debra [name], female    DOB: 29-Apr-1951, 73 y.o.   MRN: [id number]  HPI Patient was recently seen by my partner.  At that time she was taking Wellbutrin  300 mg a day.  Zoloft  25 mg a day was added to the Wellbutrin .  The patient recently lost custody battle over her grandchild.  The patient is very concerned that the grandchild is in a poor living arrangement.  Therefore she is obviously very concerned.  This has her extremely frustrated, sad, and even depressed.  The patient independently started taking an extra half of the 150 mg Wellbutrin  equal 75 mg in addition to the 300 mg she was already taking.  She states that this has helped her substantially over the last few weeks.  She had to discontinue Zoloft  due to nausea. Past Medical History:  Diagnosis Date   Diverticulitis    Diverticulosis of colon (without mention of hemorrhage)    Esophageal reflux    Generalized anxiety disorder    Hiatal hernia    Hypertension    Iron deficiency anemia 01/19/2012   Mixed hyperlipidemia    OA (osteoarthritis)    PONV (postoperative nausea and vomiting)    Shortness of breath 02/21/2024   Unspecified hypothyroidism    Past Surgical History:  Procedure Laterality Date   ABDOMINAL HYSTERECTOMY     APPENDECTOMY     BOWEL RESECTION  05/11/2012   Procedure: LOW ANTERIOR BOWEL RESECTION;  Surgeon: Beau Bound, MD;  Location: [facility];  Service: General;;   CESAREAN SECTION     First one 48 , second on 1984   COLON RESECTION  05/11/2012   Procedure: HAND ASSISTED LAPAROSCOPIC COLON RESECTION;  Surgeon: Beau Bound, MD;  Location: [facility];  Service: General;  Laterality: N/A;  Laparoscopic Hand Assisted Partial Colectomy;   COLONOSCOPY  2011-2008   Dr. Larrie Po   COLONOSCOPY  03/10/2012   Procedure: COLONOSCOPY;  Surgeon: Ruby Corporal, MD;  [facility];  Service: Endoscopy;  Laterality: N/A;  200   Hysterctomy  1988   TONSILLECTOMY     Current Outpatient  Medications on File Prior to Visit  Medication Sig Dispense Refill   amLODipine  (NORVASC ) 5 MG tablet TAKE (1) TABLET BY MOUTH ONCE DAILY. 90 tablet 0   buPROPion  (WELLBUTRIN  XL) 300 MG 24 hr tablet TAKE ONE TABLET BY MOUTH ONCE DAILY. 90 tablet 1   Cholecalciferol (VITAMIN D3 PO) Take by mouth.     clotrimazole -betamethasone  (LOTRISONE ) cream Apply 1 Application topically daily. 30 g 1   fluticasone  (FLONASE ) 50 MCG/ACT nasal spray Place 2 sprays into both nostrils daily. 16 g 6   levothyroxine  (SYNTHROID ) 25 MCG tablet TAKE (1) TABLET BY MOUTH ONCE DAILY. 90 tablet 0   LORazepam  (ATIVAN ) 0.5 MG tablet Take 1 tablet (0.5 mg total) by mouth every 8 (eight) hours as needed for anxiety. 30 tablet 0   meclizine  (ANTIVERT ) 12.5 MG tablet Take 1 tablet (12.5 mg total) by mouth 3 (three) times daily as needed for dizziness. 30 tablet 1   meloxicam  (MOBIC ) 15 MG tablet Take 1 tablet (15 mg total) by mouth daily. 30 tablet 0   pantoprazole  (PROTONIX ) 40 MG tablet Take 1 tablet (40 mg total) by mouth daily. 90 tablet 0   propranolol  ER (INDERAL  LA) 80 MG 24 hr capsule TAKE (1) CAPSULE BY MOUTH AT BEDTIME. 90 capsule 0   rosuvastatin  (CRESTOR ) 10 MG tablet Take 1 tablet (10 mg  total) by mouth daily. Discontinue pravastatin  90 tablet 1   scopolamine  (TRANSDERM-SCOP) 1 MG/3DAYS PLACE 1 PATCH ONTO THE SKIN EVERY 3 DAYS. 10 patch 0   SYSTANE ULTRA 0.4-0.3 % SOLN Apply 1 drop to eye 2 (two) times daily.     triamcinolone  cream (KENALOG ) 0.1 % Apply 1 Application topically 2 (two) times daily. 80 g 1   sertraline  (ZOLOFT ) 25 MG tablet Take 1 tablet (25 mg total) by mouth daily. (Patient not taking: Reported on 03/27/2024) 90 tablet 0   No current facility-administered medications on file prior to visit.   Allergies  Allergen Reactions   Codeine Nausea And Vomiting   Morphine And Codeine Nausea And Vomiting   Sulfa Antibiotics Hives and Itching   Social History   Socioeconomic History   Marital status:  Married    Spouse name: Not on file   Number of children: Not on file   Years of education: Not on file   Highest education level: Not on file  Occupational History   Not on file  Tobacco Use   Smoking status: Never   Smokeless tobacco: Never  Vaping Use   Vaping status: Never Used  Substance and Sexual Activity   Alcohol use: No   Drug use: No   Sexual activity: Yes    Birth control/protection: Surgical  Other Topics Concern   Not on file  Social History Narrative   Retired Retail buyer.    Social Drivers of Corporate investment banker Strain: Low Risk  (11/18/2023)   Overall Financial Resource Strain (CARDIA)    Difficulty of Paying Living Expenses: Not hard at all  Food Insecurity: No Food Insecurity (11/18/2023)   Hunger Vital Sign    Worried About Running Out of Food in the Last Year: Never true    Ran Out of Food in the Last Year: Never true  Transportation Needs: No Transportation Needs (11/18/2023)   PRAPARE - Administrator, Civil Service (Medical): No    Lack of Transportation (Non-Medical): No  Physical Activity: Sufficiently Active (11/18/2023)   Exercise Vital Sign    Days of Exercise per Week: 4 days    Minutes of Exercise per Session: 50 min  Stress: No Stress Concern Present (11/18/2023)   [employer] of Occupational Health - Occupational Stress Questionnaire    Feeling of Stress : Not at all  Social Connections: Moderately Integrated (11/18/2023)   Social Connection and Isolation Panel [NHANES]    Frequency of Communication with Friends and Family: More than three times a week    Frequency of Social Gatherings with Friends and Family: More than three times a week    Attends Religious Services: Never    Database administrator or Organizations: Yes    Attends Engineer, structural: More than 4 times per year    Marital Status: Married  Catering manager Violence: Not At Risk (11/18/2023)   Humiliation, Afraid, Rape, and Kick  questionnaire    Fear of Current or Ex-Partner: No    Emotionally Abused: No    Physically Abused: No    Sexually Abused: No      Review of Systems  All other systems reviewed and are negative.      Objective:   Physical Exam Vitals reviewed.  Constitutional:      Appearance: Normal appearance. She is well-developed and normal weight.  HENT:     Mouth/Throat:     Pharynx: No oropharyngeal exudate or posterior oropharyngeal erythema.  Cardiovascular:     Rate and Rhythm: Normal rate and regular rhythm.     Heart sounds: Normal heart sounds. No murmur heard.    No friction rub. No gallop.  Pulmonary:     Effort: Pulmonary effort is normal. No respiratory distress.     Breath sounds: Normal breath sounds. No stridor. No wheezing or rhonchi.  Abdominal:     General: Bowel sounds are normal. There is no distension.     Palpations: Abdomen is soft. There is no mass.     Tenderness: There is no abdominal tenderness. There is no guarding or rebound.     Hernia: No hernia is present.  Musculoskeletal:     Right lower leg: No edema.     Left lower leg: No edema.  Neurological:     General: No focal deficit present.     Mental Status: She is alert and oriented to person, place, and time. Mental status is at baseline.     Cranial Nerves: No cranial nerve deficit.  Psychiatric:        Mood and Affect: Mood normal.        Behavior: Behavior normal.        Thought Content: Thought content normal.        Judgment: Judgment normal.          Assessment & Plan:  Situational depression I will add 150 mg of Wellbutrin  to the 300 that she is already taking to a maximum daily dose of 450 mg.  She will take this once a day.  I recommended against any further dose increase.  Patient states that she is unable to tolerate Lexapro  and Zoloft .  Both caused significant nausea.  However she seems to be doing well taking 375 mg a day of Wellbutrin .  Patient states that she prefers to break  Wellbutrin  in half and only take 75 mg.  I believe that this is reasonable

## 2024-03-31 ENCOUNTER — Other Ambulatory Visit (HOSPITAL_COMMUNITY): Payer: Self-pay | Admitting: Family Medicine

## 2024-03-31 DIAGNOSIS — Z1231 Encounter for screening mammogram for malignant neoplasm of breast: Secondary | ICD-10-CM

## 2024-04-13 ENCOUNTER — Other Ambulatory Visit: Payer: Self-pay | Admitting: Family Medicine

## 2024-04-28 ENCOUNTER — Ambulatory Visit: Admitting: Internal Medicine

## 2024-05-08 ENCOUNTER — Other Ambulatory Visit: Payer: Self-pay | Admitting: Family Medicine

## 2024-05-08 ENCOUNTER — Ambulatory Visit: Attending: Internal Medicine | Admitting: Internal Medicine

## 2024-05-08 ENCOUNTER — Encounter: Payer: Self-pay | Admitting: Internal Medicine

## 2024-05-08 ENCOUNTER — Telehealth: Payer: Self-pay | Admitting: Internal Medicine

## 2024-05-08 VITALS — BP 112/80 | HR 52 | Ht 65.0 in | Wt 187.0 lb

## 2024-05-08 DIAGNOSIS — R5383 Other fatigue: Secondary | ICD-10-CM | POA: Diagnosis not present

## 2024-05-08 DIAGNOSIS — R0609 Other forms of dyspnea: Secondary | ICD-10-CM | POA: Diagnosis not present

## 2024-05-08 DIAGNOSIS — R0602 Shortness of breath: Secondary | ICD-10-CM | POA: Diagnosis not present

## 2024-05-08 DIAGNOSIS — D509 Iron deficiency anemia, unspecified: Secondary | ICD-10-CM

## 2024-05-08 DIAGNOSIS — R001 Bradycardia, unspecified: Secondary | ICD-10-CM | POA: Diagnosis not present

## 2024-05-08 NOTE — Patient Instructions (Signed)
 Medication Instructions:  Your physician recommends that you continue on your current medications as directed. Please refer to the Current Medication list given to you today.   Labwork: Vitamin D and Iron w/Ferritin to be completed at [facility] or [facility] today or tomorrow  Testing/Procedures: Your physician has requested that you have an echocardiogram. Echocardiography is a painless test that uses sound waves to create images of your heart. It provides your doctor with information about the size and shape of your heart and how well your heart's chambers and valves are working. This procedure takes approximately one hour. There are no restrictions for this procedure. Please do NOT wear cologne, perfume, aftershave, or lotions (deodorant is allowed). Please arrive 15 minutes prior to your appointment time.  Please note: We ask at that you not bring children with you during ultrasound (echo/ vascular) testing. Due to room size and safety concerns, children are not allowed in the ultrasound rooms during exams. Our front office staff cannot provide observation of children in our lobby area while testing is being conducted. An adult accompanying a patient to their appointment will only be allowed in the ultrasound room at the discretion of the ultrasound technician under special circumstances. We apologize for any inconvenience.  Your physician has requested that you have en exercise stress myoview. For further information please visit [email or web address] Please follow instruction sheet, as given.   Follow-Up: Your physician recommends that you schedule a follow-up appointment in: Pending Results   Any Other Special Instructions Will Be Listed Below (If Applicable). Thank you for choosing [facility]!     If you need a refill on your cardiac medications before your next appointment, please call your pharmacy.

## 2024-05-08 NOTE — Telephone Encounter (Signed)
 Checking percert on the following patient for testing scheduled at [facility].    Myoview Exercise Dx: DOE   06/13/2024

## 2024-05-08 NOTE — Progress Notes (Signed)
 Cardiology Office Note  Date: 05/08/2024   ID: BUFORD GAYLER, DOB 02-Mar-1951, MRN [id number]  PCP:  Austine Lefort, MD  Cardiologist:  Lasalle Pointer, MD Electrophysiologist:  None   History of Present Illness: Debra [name] is a 73 y.o. female  Active at baseline.  Retired 4 years ago where she went to [place] with her husband and walked for about 15 miles daily.  However recently, she is only able to walk for 1 mile after which she feels tired and gets short of breath.  No angina.  No dizziness, syncope or leg swelling.  No prior ischemia evaluation, no prior MI/PCI/CABG.  Her husband is a professor, specialized in endophyte.  Past Medical History:  Diagnosis Date   Diverticulitis    Diverticulosis of colon (without mention of hemorrhage)    Esophageal reflux    Generalized anxiety disorder    Hiatal hernia    Hypertension    Iron deficiency anemia 01/19/2012   Mixed hyperlipidemia    OA (osteoarthritis)    PONV (postoperative nausea and vomiting)    Shortness of breath 02/21/2024   Unspecified hypothyroidism     Past Surgical History:  Procedure Laterality Date   ABDOMINAL HYSTERECTOMY     APPENDECTOMY     BOWEL RESECTION  05/11/2012   Procedure: LOW ANTERIOR BOWEL RESECTION;  Surgeon: Beau Bound, MD;  Location: [facility];  Service: General;;   CESAREAN SECTION     First one 48 , second on 1984   COLON RESECTION  05/11/2012   Procedure: HAND ASSISTED LAPAROSCOPIC COLON RESECTION;  Surgeon: Beau Bound, MD;  Location: [facility];  Service: General;  Laterality: N/A;  Laparoscopic Hand Assisted Partial Colectomy;   COLONOSCOPY  2011-2008   Dr. Larrie Po   COLONOSCOPY  03/10/2012   Procedure: COLONOSCOPY;  Surgeon: Ruby Corporal, MD;  [facility];  Service: Endoscopy;  Laterality: N/A;  200   Hysterctomy  1988   TONSILLECTOMY      Current Outpatient Medications  Medication Sig Dispense Refill   amLODipine  (NORVASC ) 5 MG tablet TAKE (1) TABLET BY  MOUTH ONCE DAILY. 90 tablet 0   buPROPion  (WELLBUTRIN  XL) 150 MG 24 hr tablet Take 1 tablet (150 mg total) by mouth daily. 90 tablet 3   buPROPion  (WELLBUTRIN  XL) 300 MG 24 hr tablet TAKE ONE TABLET BY MOUTH ONCE DAILY. 90 tablet 1   Cholecalciferol (VITAMIN D3 PO) Take by mouth.     clotrimazole -betamethasone  (LOTRISONE ) cream Apply 1 Application topically daily. 30 g 1   fluticasone  (FLONASE ) 50 MCG/ACT nasal spray Place 2 sprays into both nostrils daily. 16 g 6   levothyroxine  (SYNTHROID ) 25 MCG tablet TAKE (1) TABLET BY MOUTH ONCE DAILY. 90 tablet 0   LORazepam  (ATIVAN ) 0.5 MG tablet Take 1 tablet (0.5 mg total) by mouth every 8 (eight) hours as needed for anxiety. 30 tablet 0   meclizine  (ANTIVERT ) 12.5 MG tablet Take 1 tablet (12.5 mg total) by mouth 3 (three) times daily as needed for dizziness. 30 tablet 1   meloxicam  (MOBIC ) 15 MG tablet Take 1 tablet (15 mg total) by mouth daily. 30 tablet 0   pantoprazole  (PROTONIX ) 40 MG tablet Take 1 tablet (40 mg total) by mouth daily. 90 tablet 0   propranolol  ER (INDERAL  LA) 80 MG 24 hr capsule TAKE (1) CAPSULE BY MOUTH AT BEDTIME. 90 capsule 0   rosuvastatin  (CRESTOR ) 10 MG tablet Take 1 tablet (10 mg total) by mouth daily.  Discontinue pravastatin  90 tablet 1   scopolamine  (TRANSDERM-SCOP) 1 MG/3DAYS PLACE 1 PATCH ONTO THE SKIN EVERY 3 DAYS. 10 patch 0   sertraline  (ZOLOFT ) 25 MG tablet Take 1 tablet (25 mg total) by mouth daily. 90 tablet 0   SYSTANE ULTRA 0.4-0.3 % SOLN Apply 1 drop to eye 2 (two) times daily.     triamcinolone  cream (KENALOG ) 0.1 % Apply 1 Application topically 2 (two) times daily. 80 g 1   No current facility-administered medications for this visit.   Allergies:  Codeine, Morphine and codeine, and Sulfa antibiotics   Social History: The patient  reports that she has never smoked. She has never used smokeless tobacco. She reports that she does not drink alcohol and does not use drugs.   Family History: The patient's family  history includes Healthy in her son and son; Heart attack in her brother; Heart disease in her mother; Rheum arthritis in her mother.   ROS:  Please see the history of present illness. Otherwise, complete review of systems is positive for none  All other systems are reviewed and negative.   Physical Exam: VS:  BP 112/80   Pulse (!) 52   Ht 5\' 5"  (1.651 m)   Wt 187 lb (84.8 kg)   SpO2 97%   BMI 31.12 kg/m , BMI Body mass index is 31.12 kg/m.  Wt Readings from Last 3 Encounters:  05/08/24 187 lb (84.8 kg)  03/27/24 178 lb (80.7 kg)  02/21/24 187 lb (84.8 kg)    General: Patient appears comfortable at rest. HEENT: Conjunctiva and lids normal, oropharynx clear with moist mucosa. Neck: Supple, no elevated JVP or carotid bruits, no thyromegaly. Lungs: Clear to auscultation, nonlabored breathing at rest. Cardiac: Regular rate and rhythm, no S3 or significant systolic murmur, no pericardial rub. Abdomen: Soft, nontender, no hepatomegaly, bowel sounds present, no guarding or rebound. Extremities: No pitting edema, distal pulses 2+. Skin: Warm and dry. Musculoskeletal: No kyphosis. Neuropsychiatric: Alert and oriented x3, affect grossly appropriate.  Recent Labwork: 02/21/2024: ALT 17; AST 16; BUN 22; Creat 1.17; Hemoglobin 14.8; Platelets 216; Potassium 4.5; Sodium 139; TSH 2.75     Component Value Date/Time   CHOL 164 05/12/2023 0813   CHOL 192 07/13/2022 0814   TRIG 105 05/12/2023 0813   HDL 67 05/12/2023 0813   HDL 62 07/13/2022 0814   CHOLHDL 2.4 05/12/2023 0813   VLDL 27 07/27/2017 0801   LDLCALC 78 05/12/2023 0813     Assessment and Plan:   DOE: Active at baseline, where she walked for 15 miles a day around 4 years ago but now she is only able to walk for 1 mile before getting short of breath and fatigue.  Obtain echocardiogram and exercise Myoview.  Will obtain vitamin D, iron panel with ferritin to rule out fatigue as well.  HTN, controlled: Continue amlodipine  5 mg  once daily, follows with PCP.  Sinus bradycardia: Currently on propranolol  80 mg once daily, started by her endocrinologist.  If her HR drops to less than 50 bpm, peripheral dose will needs to be reduced.  Patient is aware.      Medication Adjustments/Labs and Tests Ordered: Current medicines are reviewed at length with the patient today.  Concerns regarding medicines are outlined above.    Disposition:  Follow up pending results  Signed Alyxander Kollmann Priya Timouthy Gilardi, MD, 05/08/2024 2:07 PM    [facility] at [facility] [address] [place], [place], [place] [address]

## 2024-05-09 DIAGNOSIS — R5383 Other fatigue: Secondary | ICD-10-CM | POA: Diagnosis not present

## 2024-05-09 DIAGNOSIS — D509 Iron deficiency anemia, unspecified: Secondary | ICD-10-CM | POA: Diagnosis not present

## 2024-05-11 ENCOUNTER — Ambulatory Visit: Payer: Self-pay | Admitting: Internal Medicine

## 2024-05-11 DIAGNOSIS — Z01810 Encounter for preprocedural cardiovascular examination: Secondary | ICD-10-CM

## 2024-05-11 DIAGNOSIS — I25119 Atherosclerotic heart disease of native coronary artery with unspecified angina pectoris: Secondary | ICD-10-CM

## 2024-05-14 ENCOUNTER — Other Ambulatory Visit: Payer: Self-pay | Admitting: Family Medicine

## 2024-05-14 DIAGNOSIS — F330 Major depressive disorder, recurrent, mild: Secondary | ICD-10-CM

## 2024-05-14 DIAGNOSIS — F411 Generalized anxiety disorder: Secondary | ICD-10-CM

## 2024-05-15 ENCOUNTER — Telehealth: Payer: Self-pay

## 2024-05-15 ENCOUNTER — Other Ambulatory Visit: Payer: Self-pay

## 2024-05-15 DIAGNOSIS — K219 Gastro-esophageal reflux disease without esophagitis: Secondary | ICD-10-CM

## 2024-05-15 MED ORDER — PANTOPRAZOLE SODIUM 40 MG PO TBEC: 40.0000 mg | DELAYED_RELEASE_TABLET | Freq: Every day | ORAL | 0 refills | Status: DC

## 2024-05-15 NOTE — Telephone Encounter (Signed)
 Prescription Request  05/15/2024  LOV: 03/27/24  What is the name of the medication or equipment? pantoprazole  (PROTONIX ) 40 MG tablet [[id number]]   Have you contacted your pharmacy to request a refill? Yes   Which pharmacy would you like this sent to?  [facility] [place], [place] - [id number] [place] [address] Phone: [phone] Fax: [phone]    Patient notified that their request is being sent to the clinical staff for review and that they should receive a response within 2 business days.   Please advise at [facility] [phone]

## 2024-05-17 ENCOUNTER — Ambulatory Visit (HOSPITAL_COMMUNITY)
Admission: RE | Admit: 2024-05-17 | Discharge: 2024-05-17 | Disposition: A | Discharge status: Home / Self Care | Source: Ambulatory Visit | Attending: Family Medicine | Admitting: Family Medicine

## 2024-05-17 DIAGNOSIS — Z1231 Encounter for screening mammogram for malignant neoplasm of breast: Secondary | ICD-10-CM | POA: Diagnosis not present

## 2024-05-18 LAB — VITAMIN D 1,25 DIHYDROXY
Vitamin D 1, 25 (OH)2 Total: 61 pg/mL
Vitamin D2 1, 25 (OH)2: <10 pg/mL
Vitamin D3 1, 25 (OH)2: 60 pg/mL

## 2024-05-18 LAB — IRON,TIBC AND FERRITIN PANEL
Ferritin: 46 ng/mL (ref 15–150)
Iron Saturation: 18 % (ref 15–55)
Iron: 64 ug/dL (ref 27–139)
Total Iron Binding Capacity: 348 ug/dL (ref 250–450)
UIBC: 284 ug/dL (ref 118–369)

## 2024-05-24 ENCOUNTER — Other Ambulatory Visit: Payer: Self-pay

## 2024-05-24 DIAGNOSIS — E039 Hypothyroidism, unspecified: Secondary | ICD-10-CM

## 2024-05-24 MED ORDER — LEVOTHYROXINE SODIUM 25 MCG PO TABS: 25.0000 ug | ORAL_TABLET | Freq: Every day | ORAL | 0 refills | Status: DC

## 2024-05-26 ENCOUNTER — Telehealth: Payer: Self-pay

## 2024-05-26 NOTE — Telephone Encounter (Signed)
 Prescription Request  05/26/2024  LOV: 03/27/24  What is the name of the medication or equipment? triamcinolone  cream (KENALOG ) 0.1 % [[id number]]   Have you contacted your pharmacy to request a refill? Yes   Which pharmacy would you like this sent to?  [facility] [place], [place] - [id number] [place] [address] Phone: [phone] Fax: [phone]    Patient notified that their request is being sent to the clinical staff for review and that they should receive a response within 2 business days.   Please advise at [facility] [phone]

## 2024-06-12 ENCOUNTER — Encounter (HOSPITAL_COMMUNITY)

## 2024-06-12 ENCOUNTER — Ambulatory Visit: Attending: Internal Medicine

## 2024-06-12 DIAGNOSIS — R0609 Other forms of dyspnea: Secondary | ICD-10-CM | POA: Diagnosis not present

## 2024-06-13 ENCOUNTER — Encounter (HOSPITAL_COMMUNITY)

## 2024-06-13 ENCOUNTER — Inpatient Hospital Stay (HOSPITAL_COMMUNITY): Admission: RE | Admit: 2024-06-13 | Source: Ambulatory Visit

## 2024-06-13 LAB — ECHOCARDIOGRAM COMPLETE
AR max vel: 1.93 cm2
AV Area VTI: 1.77 cm2
AV Area mean vel: 1.7 cm2
AV Mean grad: 6 mmHg
AV Peak grad: 10 mmHg
Ao pk vel: 1.58 m/s
Area-P 1/2: 4.41 cm2
Calc EF: 76 %
MV VTI: 1.45 cm2
S' Lateral: 2.8 cm
Single Plane A2C EF: 76.9 %
Single Plane A4C EF: 74.1 %

## 2024-06-22 ENCOUNTER — Other Ambulatory Visit: Payer: Self-pay | Admitting: Family Medicine

## 2024-06-22 NOTE — Telephone Encounter (Signed)
 Prescription Request  06/22/2024  LOV: 03/27/2024  What is the name of the medication or equipment?   triamcinolone  cream (KENALOG ) 0.1 %   Have you contacted your pharmacy to request a refill? Yes   Which pharmacy would you like this sent to?  [facility] [place], [place] - [id number] [place] [address] Phone: [phone] Fax: [phone]    Patient notified that their request is being sent to the clinical staff for review and that they should receive a response within 2 business days.   Please advise pharmacist.

## 2024-06-23 ENCOUNTER — Other Ambulatory Visit: Payer: Self-pay

## 2024-06-23 MED ORDER — TRIAMCINOLONE ACETONIDE 0.1 % EX CREA: 1.0000 | TOPICAL_CREAM | Freq: Two times a day (BID) | CUTANEOUS | 1 refills | Status: AC

## 2024-06-23 NOTE — Telephone Encounter (Signed)
 Requested medication (s) are due for refill today: yes  Requested medication (s) are on the active medication list: yes  Last refill:  05/17/23 80 g 1 RF  Future visit scheduled: yes  Notes to clinic:  med not delegated to NT to RF   Requested Prescriptions  Pending Prescriptions Disp Refills   triamcinolone  cream (KENALOG ) 0.1 % 80 g 1    Sig: Apply 1 Application topically 2 (two) times daily.     Not Delegated - Dermatology:  Corticosteroids Failed - 06/23/2024  1:46 PM      Failed - This refill cannot be delegated      Passed - Valid encounter within last 12 months    Recent Outpatient Visits           2 months ago Situational depression   [place] [facility] Duanne, Butler DASEN, MD   4 months ago Shortness of breath   [place] [facility] Aletha Bene, MD   1 year ago Pure hypercholesterolemia   [place] [facility] Duanne Butler DASEN, MD   1 year ago Pure hypercholesterolemia   [place] [facility] Pickard, Butler DASEN, MD   1 year ago Pure hypercholesterolemia   [place] [facility] Pickard, Butler DASEN, MD

## 2024-06-30 ENCOUNTER — Ambulatory Visit (HOSPITAL_COMMUNITY)
Admission: RE | Admit: 2024-06-30 | Discharge: 2024-06-30 | Disposition: A | Discharge status: Home / Self Care | Source: Ambulatory Visit | Attending: Internal Medicine | Admitting: Internal Medicine

## 2024-06-30 ENCOUNTER — Encounter (HOSPITAL_BASED_OUTPATIENT_CLINIC_OR_DEPARTMENT_OTHER)
Admission: RE | Admit: 2024-06-30 | Discharge: 2024-06-30 | Disposition: A | Discharge status: Home / Self Care | Source: Ambulatory Visit | Attending: Internal Medicine

## 2024-06-30 ENCOUNTER — Other Ambulatory Visit: Payer: Self-pay | Admitting: Student

## 2024-06-30 DIAGNOSIS — R0609 Other forms of dyspnea: Secondary | ICD-10-CM

## 2024-06-30 LAB — NM MYOCAR MULTI W/SPECT W/WALL MOTION / EF
Base ST Depression (mm): 0 mm
Estimated workload: 1
Exercise duration (min): 0 min
Exercise duration (sec): 0 s
MPHR: 147 {beats}/min
Nuc Stress EF: 71 %
Peak HR: 89 {beats}/min
Percent HR: 60 %
Rest HR: 51 {beats}/min
ST Depression (mm): 0 mm
TID: 1.3

## 2024-06-30 MED ORDER — REGADENOSON 0.4 MG/5ML IV SOLN
INTRAVENOUS | Status: AC
  Administered 2024-06-30: 0.4 mg via INTRAVENOUS
  Filled 2024-06-30: qty 5

## 2024-06-30 MED ORDER — TECHNETIUM TC 99M TETROFOSMIN IV KIT
31.2000 | PACK | Freq: Once | INTRAVENOUS | Status: AC | PRN
  Administered 2024-06-30: 31.2 via INTRAVENOUS

## 2024-06-30 MED ORDER — SODIUM CHLORIDE FLUSH 0.9 % IV SOLN
INTRAVENOUS | Status: AC
  Administered 2024-06-30: 10 mL via INTRAVENOUS
  Filled 2024-06-30: qty 10

## 2024-06-30 MED ORDER — TECHNETIUM TC 99M TETROFOSMIN IV KIT
10.7000 | PACK | Freq: Once | INTRAVENOUS | Status: AC | PRN
  Administered 2024-06-30: 10.7 via INTRAVENOUS

## 2024-06-30 NOTE — Progress Notes (Signed)
     [place] JONELLE Hawks presented for a Lexiscan nuclear stress test today. I Laymon CHRISTELLA Qua, PA-C, provided direct supervision and was present during the stress portion of the study today, which was completed without significant symptoms, immediate complications, or acute ST/T changes on ECG.  Stress imaging is pending at this time.  Preliminary ECG findings may be listed in the chart, but the stress test result will not be finalized until perfusion imaging is complete.  Laymon CHRISTELLA Qua, PA-C  06/30/2024, 11:07 AM

## 2024-07-03 ENCOUNTER — Other Ambulatory Visit: Payer: Self-pay | Admitting: Family Medicine

## 2024-07-03 DIAGNOSIS — E7800 Pure hypercholesterolemia, unspecified: Secondary | ICD-10-CM

## 2024-07-11 MED ORDER — METOPROLOL TARTRATE 100 MG PO TABS: 100.0000 mg | ORAL_TABLET | Freq: Once | ORAL | 0 refills | Status: DC

## 2024-07-11 NOTE — Telephone Encounter (Signed)
-----   Message from Vishnu P Mallipeddi sent at 07/03/2024  4:25 PM EDT ----- No ischemia but TID score was elevated.  Obtain CT cardiac to rule out significant CAD. ----- Message ----- From: Shlomo Wilbert SAUNDERS, MD Sent: 06/30/2024   5:06 PM EDT To: Vishnu P Mallipeddi, MD

## 2024-07-11 NOTE — Addendum Note (Signed)
 Addended by: JOHNNYE LITTIE HERO on: 07/11/2024 08:38 AM   Modules accepted: Orders

## 2024-07-11 NOTE — Telephone Encounter (Signed)
 The patient has been notified of the result and verbalized understanding.  All questions (if any) were answered. Advised patient that I will mail her a lab order and instructions will be sent to her MyChart patient verbalized understanding.  Littie CHRISTELLA Croak, CMA 07/11/2024 8:22 AM

## 2024-07-17 DIAGNOSIS — I25119 Atherosclerotic heart disease of native coronary artery with unspecified angina pectoris: Secondary | ICD-10-CM | POA: Diagnosis not present

## 2024-07-17 DIAGNOSIS — Z01810 Encounter for preprocedural cardiovascular examination: Secondary | ICD-10-CM | POA: Diagnosis not present

## 2024-07-18 LAB — BASIC METABOLIC PANEL WITH GFR
BUN/Creatinine Ratio: 17 (ref 12–28)
BUN: 19 mg/dL (ref 8–27)
CO2: 21 mmol/L (ref 20–29)
Calcium: 9.4 mg/dL (ref 8.7–10.3)
Chloride: 103 mmol/L (ref 96–106)
Creatinine, Ser: 1.14 mg/dL — ABNORMAL HIGH (ref 0.57–1.00)
Glucose: 105 mg/dL — ABNORMAL HIGH (ref 70–99)
Potassium: 4.4 mmol/L (ref 3.5–5.2)
Sodium: 138 mmol/L (ref 134–144)
eGFR: 51 mL/min/1.73 — ABNORMAL LOW (ref >59)

## 2024-07-19 ENCOUNTER — Encounter (HOSPITAL_COMMUNITY): Payer: Self-pay

## 2024-07-20 ENCOUNTER — Other Ambulatory Visit: Payer: Self-pay | Admitting: Family Medicine

## 2024-07-20 DIAGNOSIS — K219 Gastro-esophageal reflux disease without esophagitis: Secondary | ICD-10-CM

## 2024-07-20 NOTE — Telephone Encounter (Unsigned)
 Copied from CRM [id number]. Topic: Clinical - Medication Refill >> Jul 20, 2024  2:41 PM Donna E wrote: Patient is unsure of name of medication for heart burn, she believes this is the correct medication  Medication: pantoprazole  (PROTONIX ) 40 MG tablet  Has the patient contacted their pharmacy? Yes pharmacy stats to call provider  This is the patient's preferred pharmacy:   [facility] [place], [place] - [id number] [place] [address] Phone: [phone] Fax: [phone]  Is this the correct pharmacy for this prescription? Yes If no, delete pharmacy and type the correct one.   Has the prescription been filled recently? Yes  Is the patient out of the medication? Yes  Has the patient been seen for an appointment in the last year OR does the patient have an upcoming appointment? Yes  Can we respond through MyChart? Yes  Agent: Please be advised that Rx refills may take up to 3 business days. We ask that you follow-up with your pharmacy.

## 2024-07-24 ENCOUNTER — Ambulatory Visit (HOSPITAL_COMMUNITY)
Admission: RE | Admit: 2024-07-24 | Discharge: 2024-07-24 | Disposition: A | Discharge status: Home / Self Care | Source: Ambulatory Visit | Attending: Internal Medicine

## 2024-07-24 ENCOUNTER — Other Ambulatory Visit: Payer: Self-pay

## 2024-07-24 ENCOUNTER — Other Ambulatory Visit (HOSPITAL_COMMUNITY)

## 2024-07-24 DIAGNOSIS — I25119 Atherosclerotic heart disease of native coronary artery with unspecified angina pectoris: Secondary | ICD-10-CM | POA: Insufficient documentation

## 2024-07-24 DIAGNOSIS — R9430 Abnormal result of cardiovascular function study, unspecified: Secondary | ICD-10-CM

## 2024-07-24 DIAGNOSIS — K219 Gastro-esophageal reflux disease without esophagitis: Secondary | ICD-10-CM

## 2024-07-24 DIAGNOSIS — R0609 Other forms of dyspnea: Secondary | ICD-10-CM | POA: Insufficient documentation

## 2024-07-24 DIAGNOSIS — K449 Diaphragmatic hernia without obstruction or gangrene: Secondary | ICD-10-CM | POA: Diagnosis not present

## 2024-07-24 DIAGNOSIS — R918 Other nonspecific abnormal finding of lung field: Secondary | ICD-10-CM | POA: Insufficient documentation

## 2024-07-24 MED ORDER — IOHEXOL 350 MG/ML SOLN
100.0000 mL | Freq: Once | INTRAVENOUS | Status: AC | PRN
  Administered 2024-07-24: 100 mL via INTRAVENOUS

## 2024-07-24 MED ORDER — NITROGLYCERIN 0.4 MG SL SUBL
0.8000 mg | SUBLINGUAL_TABLET | Freq: Once | SUBLINGUAL | Status: AC
  Administered 2024-07-24: 0.8 mg via SUBLINGUAL

## 2024-07-24 MED ORDER — PANTOPRAZOLE SODIUM 40 MG PO TBEC: 40.0000 mg | DELAYED_RELEASE_TABLET | Freq: Every day | ORAL | 1 refills | Status: AC

## 2024-07-24 NOTE — Telephone Encounter (Signed)
 Patient came to the office to follow up on refill requested by pharmacy for heartburn medicine.   Patient requesting for refill to be sent to   [facility] [place], [place] - [id number] [place] [address] Phone: [phone]  Fax: [phone].  Please advise patient at [phone].

## 2024-07-25 ENCOUNTER — Ambulatory Visit: Payer: Self-pay | Admitting: Internal Medicine

## 2024-07-25 ENCOUNTER — Other Ambulatory Visit: Payer: Self-pay | Admitting: *Deleted

## 2024-07-25 DIAGNOSIS — E782 Mixed hyperlipidemia: Secondary | ICD-10-CM

## 2024-07-25 DIAGNOSIS — E039 Hypothyroidism, unspecified: Secondary | ICD-10-CM

## 2024-07-25 DIAGNOSIS — E042 Nontoxic multinodular goiter: Secondary | ICD-10-CM

## 2024-07-26 ENCOUNTER — Other Ambulatory Visit: Payer: Self-pay

## 2024-07-26 ENCOUNTER — Telehealth: Payer: Self-pay | Admitting: Family Medicine

## 2024-07-26 NOTE — Telephone Encounter (Signed)
 Prescription Request  07/26/2024  LOV: 03/27/2024  What is the name of the medication or equipment?   triamcinolone  cream (KENALOG ) 0.1 %   Have you contacted your pharmacy to request a refill? Yes   Which pharmacy would you like this sent to?  [facility] [place], [place] - [id number] [place] [address] Phone: [phone] Fax: [phone]    Patient notified that their request is being sent to the clinical staff for review and that they should receive a response within 2 business days.   Please advise pharmacist

## 2024-07-27 ENCOUNTER — Other Ambulatory Visit: Payer: Self-pay

## 2024-07-27 DIAGNOSIS — E039 Hypothyroidism, unspecified: Secondary | ICD-10-CM

## 2024-07-27 MED ORDER — LEVOTHYROXINE SODIUM 25 MCG PO TABS: 25.0000 ug | ORAL_TABLET | Freq: Every day | ORAL | 0 refills | Status: DC

## 2024-07-28 DIAGNOSIS — E042 Nontoxic multinodular goiter: Secondary | ICD-10-CM | POA: Diagnosis not present

## 2024-07-28 DIAGNOSIS — E782 Mixed hyperlipidemia: Secondary | ICD-10-CM | POA: Diagnosis not present

## 2024-07-28 DIAGNOSIS — E039 Hypothyroidism, unspecified: Secondary | ICD-10-CM | POA: Diagnosis not present

## 2024-07-29 LAB — LIPID PANEL
Chol/HDL Ratio: 2.8 ratio (ref 0.0–4.4)
Cholesterol, Total: 163 mg/dL (ref 100–199)
HDL: 59 mg/dL (ref >39)
LDL Chol Calc (NIH): 84 mg/dL (ref 0–99)
Triglycerides: 109 mg/dL (ref 0–149)
VLDL Cholesterol Cal: 20 mg/dL (ref 5–40)

## 2024-07-29 LAB — TSH: TSH: 6.36 u[IU]/mL — ABNORMAL HIGH (ref 0.450–4.500)

## 2024-07-29 LAB — T4, FREE: Free T4: 1.3 ng/dL (ref 0.82–1.77)

## 2024-08-01 ENCOUNTER — Other Ambulatory Visit: Payer: Self-pay | Admitting: Family Medicine

## 2024-08-01 ENCOUNTER — Ambulatory Visit: Payer: Medicare PPO | Admitting: "Endocrinology

## 2024-08-01 DIAGNOSIS — Z20828 Contact with and (suspected) exposure to other viral communicable diseases: Secondary | ICD-10-CM

## 2024-08-03 ENCOUNTER — Other Ambulatory Visit: Payer: Self-pay

## 2024-08-03 ENCOUNTER — Telehealth: Payer: Self-pay | Admitting: Family Medicine

## 2024-08-03 DIAGNOSIS — F411 Generalized anxiety disorder: Secondary | ICD-10-CM

## 2024-08-03 DIAGNOSIS — L720 Epidermal cyst: Secondary | ICD-10-CM | POA: Diagnosis not present

## 2024-08-03 DIAGNOSIS — L821 Other seborrheic keratosis: Secondary | ICD-10-CM | POA: Diagnosis not present

## 2024-08-03 DIAGNOSIS — D0421 Carcinoma in situ of skin of right ear and external auricular canal: Secondary | ICD-10-CM | POA: Diagnosis not present

## 2024-08-03 MED ORDER — PROPRANOLOL HCL ER 80 MG PO CP24: ORAL_CAPSULE | ORAL | 0 refills | Status: DC

## 2024-08-03 MED ORDER — LORAZEPAM 0.5 MG PO TABS: 0.5000 mg | ORAL_TABLET | Freq: Three times a day (TID) | ORAL | 0 refills | Status: DC | PRN

## 2024-08-03 NOTE — Telephone Encounter (Signed)
 Prescription Request  08/03/2024  LOV: 03/27/2024  What is the name of the medication or equipment?   propranolol  ER (INDERAL  LA) 80 MG 24 hr capsule  **90 day script requested**  LORazepam  (ATIVAN ) 0.5 MG tablet [[id number]]   Have you contacted your pharmacy to request a refill? Yes   Which pharmacy would you like this sent to?  [facility] [place], [place] - [id number] [place] [address] Phone: [phone] Fax: [phone]    Patient notified that their request is being sent to the clinical staff for review and that they should receive a response within 2 business days.   Please advise pharmacist.

## 2024-08-03 NOTE — Telephone Encounter (Signed)
 Propranolol  has been sent in. Awaiting rx approval from PCP for Ativan .

## 2024-08-10 ENCOUNTER — Ambulatory Visit: Admitting: "Endocrinology

## 2024-08-10 ENCOUNTER — Encounter: Payer: Self-pay | Admitting: "Endocrinology

## 2024-08-10 VITALS — BP 128/74 | HR 52 | Ht 65.0 in | Wt 190.8 lb

## 2024-08-10 DIAGNOSIS — E782 Mixed hyperlipidemia: Secondary | ICD-10-CM | POA: Diagnosis not present

## 2024-08-10 DIAGNOSIS — E039 Hypothyroidism, unspecified: Secondary | ICD-10-CM

## 2024-08-10 DIAGNOSIS — E66811 Obesity, class 1: Secondary | ICD-10-CM | POA: Insufficient documentation

## 2024-08-10 DIAGNOSIS — Z6831 Body mass index (BMI) 31.0-31.9, adult: Secondary | ICD-10-CM

## 2024-08-10 DIAGNOSIS — E6609 Other obesity due to excess calories: Secondary | ICD-10-CM

## 2024-08-10 MED ORDER — LEVOTHYROXINE SODIUM 25 MCG PO TABS: 25.0000 ug | ORAL_TABLET | Freq: Every day | ORAL | 1 refills | Status: AC

## 2024-08-10 MED ORDER — ZEPBOUND 2.5 MG/0.5ML ~~LOC~~ SOAJ: 2.5000 mg | SUBCUTANEOUS | 0 refills | Status: DC

## 2024-08-10 NOTE — Progress Notes (Signed)
 08/10/2024, 1:26 PM   Endocrinology follow-up note  Subjective:    Patient ID: Debra [name], female    DOB: Oct 17, 1951, PCP Duanne Butler DASEN, MD   Past Medical History:  Diagnosis Date   Diverticulitis    Diverticulosis of colon (without mention of hemorrhage)    Esophageal reflux    Generalized anxiety disorder    Hiatal hernia    Hypertension    Iron deficiency anemia 01/19/2012   Mixed hyperlipidemia    OA (osteoarthritis)    PONV (postoperative nausea and vomiting)    Shortness of breath 02/21/2024   Unspecified hypothyroidism    Past Surgical History:  Procedure Laterality Date   ABDOMINAL HYSTERECTOMY     APPENDECTOMY     BOWEL RESECTION  05/11/2012   Procedure: LOW ANTERIOR BOWEL RESECTION;  Surgeon: Oneil DELENA Budge, MD;  Location: [facility];  Service: General;;   CESAREAN SECTION     First one 64 , second on 1984   COLON RESECTION  05/11/2012   Procedure: HAND ASSISTED LAPAROSCOPIC COLON RESECTION;  Surgeon: Oneil DELENA Budge, MD;  Location: [facility];  Service: General;  Laterality: N/A;  Laparoscopic Hand Assisted Partial Colectomy;   COLONOSCOPY  2011-2008   Dr. Budge   COLONOSCOPY  03/10/2012   Procedure: COLONOSCOPY;  Surgeon: Claudis RAYMOND Rivet, MD;  Location: [facility];  Service: Endoscopy;  Laterality: N/A;  200   Hysterctomy  12/07/1986   TONSILLECTOMY     Social History   Socioeconomic History   Marital status: Married    Spouse name: Not on file   Number of children: Not on file   Years of education: Not on file   Highest education level: Not on file  Occupational History   Not on file  Tobacco Use   Smoking status: Never   Smokeless tobacco: Never  Vaping Use   Vaping status: Never Used  Substance and Sexual Activity   Alcohol use: No   Drug use: No   Sexual activity: Yes    Birth control/protection: Surgical  Other Topics Concern   Not on file  Social History Narrative   Retired  Retail buyer.    Social Drivers of Corporate investment banker Strain: Low Risk  (11/18/2023)   Overall Financial Resource Strain (CARDIA)    Difficulty of Paying Living Expenses: Not hard at all  Food Insecurity: No Food Insecurity (11/18/2023)   Hunger Vital Sign    Worried About Running Out of Food in the Last Year: Never true    Ran Out of Food in the Last Year: Never true  Transportation Needs: No Transportation Needs (11/18/2023)   PRAPARE - Administrator, Civil Service (Medical): No    Lack of Transportation (Non-Medical): No  Physical Activity: Sufficiently Active (11/18/2023)   Exercise Vital Sign    Days of Exercise per Week: 4 days    Minutes of Exercise per Session: 50 min  Stress: No Stress Concern Present (11/18/2023)   [employer] of Occupational Health - Occupational Stress Questionnaire    Feeling of Stress : Not at all  Social Connections: Moderately Integrated (11/18/2023)   Social Connection and Isolation Panel    Frequency of Communication with  Friends and Family: More than three times a week    Frequency of Social Gatherings with Friends and Family: More than three times a week    Attends Religious Services: Never    Database administrator or Organizations: Yes    Attends Engineer, structural: More than 4 times per year    Marital Status: Married   Family History  Problem Relation Age of Onset   Rheum arthritis Mother    Heart disease Mother    Heart attack Brother    Healthy Son    Healthy Son    Anesthesia problems Neg Hx    Hypotension Neg Hx    Malignant hyperthermia Neg Hx    Pseudochol deficiency Neg Hx    Outpatient Encounter Medications as of 08/10/2024  Medication Sig   tirzepatide  (ZEPBOUND ) 2.5 MG/0.5ML Pen Inject 2.5 mg into the skin once a week.   amLODipine  (NORVASC ) 5 MG tablet TAKE (1) TABLET BY MOUTH ONCE DAILY.   buPROPion  (WELLBUTRIN  XL) 150 MG 24 hr tablet Take 1 tablet (150 mg total) by mouth daily.    buPROPion  (WELLBUTRIN  XL) 300 MG 24 hr tablet TAKE ONE TABLET BY MOUTH ONCE DAILY.   Cholecalciferol (VITAMIN D3 PO) Take by mouth.   clotrimazole -betamethasone  (LOTRISONE ) cream Apply 1 Application topically daily.   fluticasone  (FLONASE ) 50 MCG/ACT nasal spray Place 2 sprays into both nostrils daily.   levothyroxine  (SYNTHROID ) 25 MCG tablet Take 1 tablet (25 mcg total) by mouth daily before breakfast.   LORazepam  (ATIVAN ) 0.5 MG tablet Take 1 tablet (0.5 mg total) by mouth every 8 (eight) hours as needed for anxiety.   meclizine  (ANTIVERT ) 12.5 MG tablet Take 1 tablet (12.5 mg total) by mouth 3 (three) times daily as needed for dizziness.   meloxicam  (MOBIC ) 15 MG tablet Take 1 tablet (15 mg total) by mouth daily.   metoprolol  tartrate (LOPRESSOR ) 100 MG tablet Take 1 tablet (100 mg total) by mouth once for 1 dose. 2 hours prior to procedure   pantoprazole  (PROTONIX ) 40 MG tablet Take 1 tablet (40 mg total) by mouth daily.   propranolol  ER (INDERAL  LA) 80 MG 24 hr capsule TAKE (1) CAPSULE BY MOUTH AT BEDTIME.   rosuvastatin  (CRESTOR ) 10 MG tablet Take 1 tablet (10 mg total) by mouth daily. Discontinue pravastatin    scopolamine  (TRANSDERM-SCOP) 1 MG/3DAYS PLACE 1 PATCH ONTO THE SKIN EVERY 3 DAYS.   sertraline  (ZOLOFT ) 25 MG tablet Take 1 tablet (25 mg total) by mouth daily.   SYSTANE ULTRA 0.4-0.3 % SOLN Apply 1 drop to eye 2 (two) times daily.   triamcinolone  cream (KENALOG ) 0.1 % Apply 1 Application topically 2 (two) times daily.   [DISCONTINUED] levothyroxine  (SYNTHROID ) 25 MCG tablet Take 1 tablet (25 mcg total) by mouth daily before breakfast.   No facility-administered encounter medications on file as of 08/10/2024.   ALLERGIES: Allergies  Allergen Reactions   Codeine Nausea And Vomiting   Morphine And Codeine Nausea And Vomiting   Sulfa Antibiotics Hives and Itching    VACCINATION STATUS: Immunization History  Administered Date(s) Administered   Fluad Quad(high Dose 65+)  74/10/2019, 09/13/2020, 08/25/2023   INFLUENZA, HIGH DOSE SEASONAL PF 09/20/2018   Influenza Split 10/21/2013   Influenza,inj,Quad PF,6+ Mos 08/19/2015   Influenza-Unspecified 09/12/2007, 09/20/2021, 09/04/2022   PFIZER(Purple Top)SARS-COV-2 Vaccination 01/12/2020, 02/06/2020, 09/05/2020, 08/25/2023   Pfizer Covid-19 Vaccine Bivalent Booster 29yrs & up 09/20/2021   Pfizer(Comirnaty)Fall Seasonal Vaccine 12 years and older 09/04/2022   Pneumococcal Conjugate-13 08/19/2015  Pneumococcal Polysaccharide-23 02/09/2014   RSV,unspecified 09/04/2022   Tdap 05/07/2013   Zoster, Live 07/08/2013    HPI KAYDI KLEY is 73 y.o. female who is returning to follow-up for hypothyroidism, multinodular goiter.   PMD: Duanne Butler DASEN, MD. -She was put on low-dose levothyroxine  for hypothyroidism during her last visit.  In the interval, due to unexplained tachycardia arrhythmia, she was advised to hold his treatment by her cardiologist.  Her previsit thyroid  function tests are consistent with treatment withdrawal  with high TSH.   She would like to be resumed on levothyroxine . She denies any prior history of thyroid  surgery nor ablation.   She reports various forms of thyroid  dysfunction in her father members including her mother and 2 aunts.   she would like to achieve some weight loss.  She has maximized her personal efforts send looking for her next options. She denies any family history of thyroid  malignancy. She has no new complaints today.  Her previsit labs show improved LDL at 78, progressively improving from 138.  She remains on Crestor  10 mg p.o. nightly.    Her October 29, 2020 thyroid  ultrasound is also unremarkable.  Her repeat previsit thyroid  ultrasound shows involution of most of the nodules with no concerning new findings. -She denies any dysphagia, shortness of breath, nor voice change.  She denies tremors, heat intolerance, palpitations.  Review of Systems Progressive weight  loss.  Objective:    BP 128/74   Pulse (!) 52   Ht 5' 5 (1.651 m)   Wt 190 lb 12.8 oz (86.5 kg)   BMI 31.75 kg/m   Wt Readings from Last 3 Encounters:  08/10/24 190 lb 12.8 oz (86.5 kg)  05/08/24 187 lb (84.8 kg)  03/27/24 178 lb (80.7 kg)    Physical Exam   CMP ( most recent) CMP     Component Value Date/Time   NA 138 07/17/2024 1131   K 4.4 07/17/2024 1131   CL 103 07/17/2024 1131   CO2 21 07/17/2024 1131   GLUCOSE 105 (H) 07/17/2024 1131   GLUCOSE 96 02/21/2024 1523   BUN 19 07/17/2024 1131   CREATININE 1.14 (H) 07/17/2024 1131   CREATININE 1.17 (H) 02/21/2024 1523   CALCIUM  9.4 07/17/2024 1131   PROT 6.9 02/21/2024 1523   ALBUMIN 4.3 07/27/2017 0801   ALBUMIN 4.3 07/27/2017 0801   AST 16 02/21/2024 1523   ALT 17 02/21/2024 1523   ALKPHOS 54 07/27/2017 0801   ALKPHOS 54 07/27/2017 0801   BILITOT 0.5 02/21/2024 1523   GFRNONAA 53 (L) 04/29/2021 1259   GFRAA 62 04/29/2021 1259    Lipid Panel     Component Value Date/Time   CHOL 163 07/28/2024 0803   TRIG 109 07/28/2024 0803   HDL 59 07/28/2024 0803   CHOLHDL 2.8 07/28/2024 0803   CHOLHDL 2.4 05/12/2023 0813   VLDL 27 07/27/2017 0801   LDLCALC 84 07/28/2024 0803   LDLCALC 78 05/12/2023 0813      Lab Results  Component Value Date   TSH 6.360 (H) 07/28/2024   TSH 2.75 02/21/2024   TSH 2.730 07/20/2023   TSH 3.43 05/12/2023   TSH 2.810 01/19/2023   TSH 3.540 07/13/2022   TSH 2.280 11/14/2021   TSH 4.010 04/29/2021   TSH 2.370 11/05/2020   TSH 5.26 (H) 09/23/2020   FREET4 1.30 07/28/2024   FREET4 1.46 07/20/2023   FREET4 1.39 01/19/2023   FREET4 1.38 07/13/2022   FREET4 1.50 11/14/2021   FREET4 1.34 04/29/2021  FREET4 1.36 11/05/2020   FREET4 1.2 05/21/2020   FREET4 1.3 08/16/2019   FREET4 1.1 10/02/2016  August 25, 2019 thyroid  ultrasound IMPRESSION: 1. Findings suggestive of multinodular goiter. 2. Nodule #4 which is 1.2 cm meets imaging criteria to recommend a 1 year follow-up as  clinically indicated.   October 29, 2020 thyroid  ultrasound. Right lobe measures 4.3 cm, left lobe measured 4.4 cm with 1.4 cm left lobe nodule remained stable. IMPRESSION: Multinodular thyroid  again demonstrated.   Left inferior thyroid  nodule (labeled 4, 1.4 cm, TR 4) and right inferior thyroid  nodule (labeled 6, 1.1 cm, TR 4) both meet criteria for surveillance, as designated by the newly established ACR TI-RADS criteria. Surveillance ultrasound study recommended to be performed annually up to 5 years.  Surveillance thyroid /neck ultrasound on December 16, 2022 IMPRESSION: 1. Interval involution and morphology change of the left inferior thyroid  nodule. On today's examination, this nodule no longer meets criteria for continued imaging surveillance. No further follow-up recommended. 2. Interval involution of the previously identified right inferior thyroid  nodule. The nodule is no longer visible and has likely resolved. 3. Additional nodules again noted incidentally which do not meet criteria for biopsy or imaging surveillance.  Assessment & Plan:   1. Multinodular goiter 2.  Hypothyroidism 3.  Hyperlipidemia 4.  Class I obesity  Her previsit thyroid  function tests are consistent with treatment will draw with high TSH.  She will benefit from resumption of levothyroxine .  I discussed and reinitiated thyroid  prescription for levothyroxine  25 mcg p.o. daily before breakfast.     - We discussed about the correct intake of her thyroid  hormone, on empty stomach at fasting, with water , separated by at least 30 minutes from breakfast and other medications,  and separated by more than 4 hours from calcium , iron, multivitamins, acid reflux medications (PPIs). -Patient is made aware of the fact that thyroid  hormone replacement is needed for life, dose to be adjusted by periodic monitoring of thyroid  function tests.   -She is responding to Crestor  with improved LDL to 78.  She is advised  to continue Crestor  10 mg p.o. nightly.  Side effects and precautions discussed with her. Patient would like to achieve some weight loss, maximize her personal lifestyle change efforts. She may benefit from GLP-1 receptor agonists.  I discussed increase Zepbound  2.5 mg subcutaneous weekly.  Side effects and precaution discussed with her.  This medication will be advanced as she tolerates.  - Based on reviews of her previous thyroid  ultrasound reports, she has multinodular goiter, stable over time.    - I advised her  to maintain close follow up with Duanne Butler DASEN, MD for primary care needs.   I spent  25  minutes in the care of the patient today including review of labs from Thyroid  Function, CMP, and other relevant labs ; imaging/biopsy records (current and previous including abstractions from other facilities); face-to-face time discussing  her lab results and symptoms, medications doses, her options of short and long term treatment based on the latest standards of care / guidelines;   and documenting the encounter.  Debra [name]  participated in the discussions, expressed understanding, and voiced agreement with the above plans.  All questions were answered to her satisfaction. she is encouraged to contact clinic should she have any questions or concerns prior to her return visit.   Follow up plan: Return in about 3 months (around 11/09/2024) for F/U with Pre-visit Labs.   Ranny Earl, MD [facility] [address] [place], [place] [address] Phone: [phone]  Fax: [phone]     08/10/2024, 1:26 PM  This note was partially dictated with voice recognition software. Similar sounding words can be transcribed inadequately or may not  be corrected upon review.

## 2024-08-21 ENCOUNTER — Other Ambulatory Visit (HOSPITAL_COMMUNITY): Payer: Self-pay

## 2024-08-21 ENCOUNTER — Telehealth: Payer: Self-pay

## 2024-08-21 NOTE — Telephone Encounter (Signed)
 Pharmacy Patient Advocate Encounter   Received notification from CoverMyMeds that prior authorization for Zepbound  2.5MG /0.5ML pen-injectors is required/requested.   Insurance verification completed.   The patient is insured through [employer] .   Per test claim: PA required; PA submitted to above mentioned insurance via Latent Key/confirmation #/EOC [id number] Status is pending

## 2024-08-25 NOTE — Telephone Encounter (Signed)
 Pharmacy Patient Advocate Encounter  Received notification from [employer] that Prior Authorization for Zepbound  2.5MG /0.5ML pen-injectors has been DENIED.  See denial reason below. No denial letter attached in CMM. Will attach denial letter to Media tab once received.  We did not find support for use, in your situation. This drug is to be used to help lose weight. Medicare says that drugs used for weight loss are excluded from Part D coverage. Medicare did release a memo on February 24, 2023. This stated that anti-obesity medications remain excluded from Medicare Part D coverage. But, if that same drug received approval from the FDA for an additional medically accepted indication, then it could be considered a Part D drug- for that specific use only. This is not the case for your diagnosis. Therefore, we are unable to approve coverage under your Part D benefit.

## 2024-08-28 NOTE — Telephone Encounter (Signed)
 Pt made aware. Understanding voiced.

## 2024-08-30 NOTE — Progress Notes (Unsigned)
 Cardiology Office Note    Date:  08/31/2024  ID:  Jullie, Arps 1951-01-01, MRN [id number] Cardiologist: Diannah SHAUNNA Maywood, MD Cardiology APP:  Johnson Laymon HERO, PA-C { : History of Present Illness:    Debra [name] is a 73 y.o. female with past medical history of HTN, HLD and hypothyroidism who presents to the office today for follow-up.  She was examined by Dr. Mallipeddi in 05/2024 as a new patient referral after having progressively worsening shortness of breath over the past several years. An echocardiogram and Exercise Myoview  were recommended for further assessment. Her echocardiogram showed a preserved EF of 60 to 65% with no regional wall motion abnormalities and grade 1 diastolic dysfunction. RV function was normal and no significant valve abnormalities. Exercise Myoview  showed no evidence of ischemia but was read as having an elevated TID. Given that it was an intermediate-risk study, a Coronary CTA was recommended for definitive evaluation. This showed minimal, nonobstructive CAD with less than 0 to 24% stenosis along the proximal LAD and a coronary calcium  score of 0. The Radiology over-read showed right middle and lower lobe nodules with non-contrast chest CT within 3 to 6 months recommended. Was also noted to have a large hiatal hernia.  In talking with the patient today, she reports having progressive dyspnea on exertion over the past several years as described at the time of her last office visit. She still walks for exercise but says that she was previously able to walk 15 miles some days 5+ years ago and is not able to do so at this time. Reports occasional chest discomfort as well but this resolves with a heating pad.  She does have a history of a large hiatal hernia as noted on prior EGD and this was noted on the over read of her recent Coronary CTA. Reports having history of diverticulosis as well but has not been followed by GI recently. No exertional chest pain.   No reported orthopnea, PND or pitting edema.  Studies Reviewed:   EKG: EKG is not ordered today.  Echocardiogram: 06/2024 IMPRESSIONS     1. Left ventricular ejection fraction, by estimation, is 60 to 65%. The  left ventricle has normal function. The left ventricle has no regional  wall motion abnormalities. Left ventricular diastolic parameters are  consistent with Grade I diastolic  dysfunction (impaired relaxation). The average left ventricular global  longitudinal strain is -19.4 %. The global longitudinal strain is normal.   2. Right ventricular systolic function is normal. The right ventricular  size is normal.   3. The mitral valve is normal in structure. Trivial mitral valve  regurgitation. No evidence of mitral stenosis.   4. The tricuspid valve is abnormal.   5. The aortic valve is tricuspid. Aortic valve regurgitation is not  visualized. No aortic stenosis is present.   6. The inferior vena cava is normal in size with greater than 50%  respiratory variability, suggesting right atrial pressure of 3 mmHg.   Comparison(s): No prior Echocardiogram.    NST: 06/2024   A pharmacological stress test was performed using IV Lexiscan  0.4mg  over 10 seconds performed without concurrent submaximal exercise. The patient reported dyspnea and flushing during the stress test. Symptoms began at minute 1 and ended at minute 4. Test was ordered as a treadmill but the patient did not feel comfortable walking on the treadmill, even in the pre-test phase. Switched to Lexiscan . Normal blood pressure and normal heart rate response noted during stress. Heart  rate recovery was normal.   No ST deviation was noted. Arrhythmias during stress: occasional PVCs. Arrhythmias during recovery: PVCs. ECG was interpretable and without significant changes. The ECG was not diagnostic due to pharmacologic protocol.   LV perfusion is abnormal. There is no evidence of ischemia. There is no evidence of infarction.  Defect 1: There is a small defect with moderate reduction in uptake present in the apical apex location(s) that is fixed. There is normal wall motion in the defect area. Consistent with artifact.   Left ventricular function is normal. Nuclear stress EF: 71%. The left ventricular ejection fraction is hyperdynamic (>65%). End diastolic cavity size is normal. End systolic cavity size is normal. Evidence of transient ischemic dilation (TID) noted. TID was appreciated quantitatively but not visually.   Findings are consistent with no ischemia. The study is intermediate risk due to increased TID which can be seen in  multivessel CAD.  Coronary CTA: 07/2024 IMPRESSION: 1.  Coronary calcium  score of 0.   2.  Normal coronary origin with right dominance.   3. Nonobstructive CAD, with noncalcified plaque in proximal LAD causing minimal (0-24%) stenosis   RECOMMENDATIONS: CAD-RADS 1: Minimal non-obstructive CAD (0-24%). Consider non-atherosclerotic causes of chest pain. Consider preventive therapy and risk factor modification.   Physical Exam:   VS:  BP 122/78   Pulse (!) 59   Ht 5' 5 (1.651 m)   Wt 189 lb (85.7 kg)   SpO2 97%   BMI 31.45 kg/m    Wt Readings from Last 3 Encounters:  08/31/24 189 lb (85.7 kg)  08/10/24 190 lb 12.8 oz (86.5 kg)  05/08/24 187 lb (84.8 kg)     GEN: Well nourished, well developed female appearing in no acute distress NECK: No JVD; No carotid bruits CARDIAC: RRR, no murmurs, rubs, gallops RESPIRATORY:  Clear to auscultation without rales, wheezing or rhonchi  ABDOMEN: Appears non-distended. No obvious abdominal masses. EXTREMITIES: No clubbing or cyanosis. No pitting edema.  Distal pedal pulses are 2+ bilaterally.   Assessment and Plan:   1. DOE (dyspnea on exertion) - Symptoms have been occurring for several years and her cardiac workup has overall been reassuring with echocardiogram as outlined above showing a preserved EF and Coronary CTA only showed  minimal, nonobstructive CAD. Suspect her symptoms of intermittent chest discomfort are likely due to her hiatal hernia. No plans for further cardiac testing at this time. Will refer to GI as outlined below.  2. Essential hypertension - Blood pressure is well-controlled at 122/78 during today's visit. Continue Amlodipine  5 mg daily and Propranolol  ER 80 mg daily.  3. Mixed hyperlipidemia - Followed by her PCP. Remains on Crestor  10 mg daily.  4. Lung nodules - The radiology overread from her recent CT showed right middle and lower lobe nodules with follow-up noncontrast chest CT recommended in 3 to 6 months. Will order a follow-up CT to be performed later this year.  5. Diverticulosis/Hiatal hernia - She has a history of diverticulosis and a known large hiatal hernia. Denies any acute symptoms of diverticulosis at this time.  Suspect her hiatal hernia is contributing to her dyspnea on exertion and chest pain as outlined above. She prefers continued medical management and is not interested in any procedures for this but does wish to see GI back for reassessment as she was previously followed by Dr. Golda. Will enter a referral today.  Disposition: Will plan for follow-up in 1 year unless clinically indicated in the interim.   Signed, [place]  CHRISTELLA Qua, PA-C

## 2024-08-31 ENCOUNTER — Ambulatory Visit: Attending: Student | Admitting: Student

## 2024-08-31 ENCOUNTER — Encounter: Payer: Self-pay | Admitting: Student

## 2024-08-31 VITALS — BP 122/78 | HR 59 | Ht 65.0 in | Wt 189.0 lb

## 2024-08-31 DIAGNOSIS — R0609 Other forms of dyspnea: Secondary | ICD-10-CM | POA: Diagnosis not present

## 2024-08-31 DIAGNOSIS — K5790 Diverticulosis of intestine, part unspecified, without perforation or abscess without bleeding: Secondary | ICD-10-CM | POA: Diagnosis not present

## 2024-08-31 DIAGNOSIS — E782 Mixed hyperlipidemia: Secondary | ICD-10-CM | POA: Diagnosis not present

## 2024-08-31 DIAGNOSIS — I10 Essential (primary) hypertension: Secondary | ICD-10-CM

## 2024-08-31 DIAGNOSIS — K449 Diaphragmatic hernia without obstruction or gangrene: Secondary | ICD-10-CM | POA: Diagnosis not present

## 2024-08-31 DIAGNOSIS — R918 Other nonspecific abnormal finding of lung field: Secondary | ICD-10-CM

## 2024-08-31 NOTE — Patient Instructions (Signed)
 Medication Instructions:  Your physician recommends that you continue on your current medications as directed. Please refer to the Current Medication list given to you today.  *If you need a refill on your cardiac medications before your next appointment, please call your pharmacy*  Lab Work: NONE   If you have labs (blood work) drawn today and your tests are completely normal, you will receive your results only by: MyChart Message (if you have MyChart) OR A paper copy in the mail If you have any lab test that is abnormal or we need to change your treatment, we will call you to review the results.  Testing/Procedures: Non-Cardiac CT scanning, (CAT scanning), is a noninvasive, special x-ray that produces cross-sectional images of the body using x-rays and a computer. CT scans help physicians diagnose and treat medical conditions. For some CT exams, a contrast material is used to enhance visibility in the area of the body being studied. CT scans provide greater clarity and reveal more details than regular x-ray exams.   Follow-Up: At [facility], you and your health needs are our priority.  As part of our continuing mission to provide you with exceptional heart care, our providers are all part of one team.  This team includes your primary Cardiologist (physician) and Advanced Practice Providers or APPs (Physician Assistants and Nurse Practitioners) who all work together to provide you with the care you need, when you need it.  Your next appointment:   1 year(s)  Provider:   You may see Vishnu P Mallipeddi, MD or one of the following Advanced Practice Providers on your designated Care Team:   [place], PA-C  Scotesia Rosman, [place] Olivia Pavy, [place]     We recommend signing up for the patient portal called MyChart.  Sign up information is provided on this After Visit Summary.  MyChart is used to connect with patients for Virtual Visits (Telemedicine).  Patients are able  to view lab/test results, encounter notes, upcoming appointments, etc.  Non-urgent messages can be sent to your provider as well.   To learn more about what you can do with MyChart, go to [email or web address].   Other Instructions Thank you for choosing [facility]!

## 2024-09-06 ENCOUNTER — Encounter (INDEPENDENT_AMBULATORY_CARE_PROVIDER_SITE_OTHER): Payer: Self-pay | Admitting: *Deleted

## 2024-09-07 DIAGNOSIS — Z85828 Personal history of other malignant neoplasm of skin: Secondary | ICD-10-CM | POA: Diagnosis not present

## 2024-09-07 DIAGNOSIS — Z08 Encounter for follow-up examination after completed treatment for malignant neoplasm: Secondary | ICD-10-CM | POA: Diagnosis not present

## 2024-09-11 ENCOUNTER — Other Ambulatory Visit (HOSPITAL_COMMUNITY)

## 2024-09-20 ENCOUNTER — Encounter (INDEPENDENT_AMBULATORY_CARE_PROVIDER_SITE_OTHER): Payer: Self-pay | Admitting: Gastroenterology

## 2024-09-25 ENCOUNTER — Ambulatory Visit (INDEPENDENT_AMBULATORY_CARE_PROVIDER_SITE_OTHER): Admitting: Gastroenterology

## 2024-09-25 ENCOUNTER — Encounter (INDEPENDENT_AMBULATORY_CARE_PROVIDER_SITE_OTHER): Payer: Self-pay | Admitting: Gastroenterology

## 2024-09-25 VITALS — BP 152/80 | HR 45 | Temp 97.8°F | Ht 65.0 in | Wt 191.0 lb

## 2024-09-25 DIAGNOSIS — R142 Eructation: Secondary | ICD-10-CM | POA: Insufficient documentation

## 2024-09-25 DIAGNOSIS — K449 Diaphragmatic hernia without obstruction or gangrene: Secondary | ICD-10-CM | POA: Diagnosis not present

## 2024-09-25 DIAGNOSIS — K440 Diaphragmatic hernia with obstruction, without gangrene: Secondary | ICD-10-CM | POA: Insufficient documentation

## 2024-09-25 DIAGNOSIS — R0609 Other forms of dyspnea: Secondary | ICD-10-CM

## 2024-09-25 DIAGNOSIS — R143 Flatulence: Secondary | ICD-10-CM

## 2024-09-25 DIAGNOSIS — K219 Gastro-esophageal reflux disease without esophagitis: Secondary | ICD-10-CM | POA: Diagnosis not present

## 2024-09-25 DIAGNOSIS — R1010 Upper abdominal pain, unspecified: Secondary | ICD-10-CM | POA: Insufficient documentation

## 2024-09-25 NOTE — Progress Notes (Signed)
 Toribio Fortune, M.D. Gastroenterology & Hepatology [facility] [address] [place], [place] [address] Primary Care Physician: Duanne Butler DASEN, MD [address] [place] [address]  Referring MD: Laymon HERO. Johnson, PA-C  Chief Complaint: Shortness of breath and hiatal hernia  History of Present Illness: Debra [name] is a 73 y.o. female with past medical history of hiatal hernia, osteoarthritis, hypertension, GERD, diverticulosis, who presents for evaluation of heartburn, abdominal pain, flatulence, burping, shortness of breath and hiatal hernia.  Patient was referred by cardiology to our clinic as part of the evaluation of shortness of breath with exertion.  She had cardiac testing that showed minimal nonobstructive coronary artery disease.  It was felt that her symptoms were possibly related to hiatal hernia and was referred for further evaluation. She had a CT coronary test on 07/24/24, which showed a large hiatal hernia.  Patient states within the last two years she has had worsening shortness of breath, which has limited her activities severely as she no longer can take frequent walks.   No dysphagia. Has had GERD for the last 15 years. Has heartburn 1-2 times a week. States she takes pantoprazole  40 mg qday which controls most of her symptoms - has been taking this for multiple years. She takes it at night, as if she takes in the morning and does yoga  she will get dizzy. The heartburn actually alleviates if she walks. No odynophagia.   She also reports having 10+ years of abdominal discomfort in her upper abdomen. States that her abdomen hurts almost every night. For this, she uses a heating pad.  She has also presented significant flatulence and burping for years. She does not take any medications for this.  The patient denies having any nausea, vomiting, fever, chills, hematochezia, melena, hematemesis, diarrhea,  jaundice, pruritus or weight loss.  She recently stopped diet Coke and sweetener.  Last EGD: 01/24/2010, performed by Dr. Mavis Presence hiatal hernia Last Colonoscopy:05/28/2009 , performed by Dr. Mavis - diverticulosis Cologuard: 12/2023 - negative  FHx: neg for any gastrointestinal/liver disease, no malignancies Social: neg smoking, alcohol or illicit drug use Surgical: partial colectomy due to diverticulitis  Past Medical History: Past Medical History:  Diagnosis Date   Diverticulitis    Diverticulosis of colon (without mention of hemorrhage)    Esophageal reflux    Generalized anxiety disorder    Hiatal hernia    Hypertension    Iron deficiency anemia 01/19/2012   Mixed hyperlipidemia    OA (osteoarthritis)    PONV (postoperative nausea and vomiting)    Shortness of breath 02/21/2024   Unspecified hypothyroidism     Past Surgical History: Past Surgical History:  Procedure Laterality Date   ABDOMINAL HYSTERECTOMY     APPENDECTOMY     BOWEL RESECTION  05/11/2012   Procedure: LOW ANTERIOR BOWEL RESECTION;  Surgeon: Oneil DELENA Mavis, MD;  [facility]: [facility];  Service: General;;   CESAREAN SECTION     First one 89 , second on 1984   COLON RESECTION  05/11/2012   Procedure: HAND ASSISTED LAPAROSCOPIC COLON RESECTION;  Surgeon: Oneil DELENA Mavis, MD;  Location: [facility];  Service: General;  Laterality: N/A;  Laparoscopic Hand Assisted Partial Colectomy;   COLONOSCOPY  2011-2008   Dr. Mavis   COLONOSCOPY  03/10/2012   Procedure: COLONOSCOPY;  Surgeon: Claudis RAYMOND Rivet, MD;  Location: [facility];  Service: Endoscopy;  Laterality: N/A;  200   Hysterctomy  12/07/1986  TONSILLECTOMY      Family History: Family History  Problem Relation Age of Onset   Rheum arthritis Mother    Heart disease Mother    Heart attack Brother    Healthy Son    Healthy Son    Anesthesia problems Neg Hx    Hypotension Neg Hx    Malignant hyperthermia Neg Hx    Pseudochol deficiency Neg Hx      Social History: Social History   Tobacco Use  Smoking Status Never  Smokeless Tobacco Never   Social History   Substance and Sexual Activity  Alcohol Use No   Social History   Substance and Sexual Activity  Drug Use No    Allergies: Allergies  Allergen Reactions   Codeine Nausea And Vomiting   Morphine And Codeine Nausea And Vomiting   Sulfa Antibiotics Hives and Itching    Medications: Current Outpatient Medications  Medication Sig Dispense Refill   amLODipine  (NORVASC ) 5 MG tablet TAKE (1) TABLET BY MOUTH ONCE DAILY. (Patient taking differently: Take 2.5 mg by mouth daily.) 90 tablet 0   buPROPion  (WELLBUTRIN  XL) 150 MG 24 hr tablet Take 1 tablet (150 mg total) by mouth daily. (Patient taking differently: Take 75 mg by mouth as needed.) 90 tablet 3   buPROPion  (WELLBUTRIN  XL) 300 MG 24 hr tablet TAKE ONE TABLET BY MOUTH ONCE DAILY. 90 tablet 1   Cholecalciferol (VITAMIN D3 PO) Take by mouth. (Patient taking differently: Take by mouth daily at 6 (six) AM.)     clotrimazole -betamethasone  (LOTRISONE ) cream Apply 1 Application topically daily. (Patient taking differently: Apply 1 Application topically as needed.) 30 g 1   fluticasone  (FLONASE ) 50 MCG/ACT nasal spray Place 2 sprays into both nostrils daily. (Patient taking differently: Place 2 sprays into both nostrils as needed.) 16 g 6   levothyroxine  (SYNTHROID ) 25 MCG tablet Take 1 tablet (25 mcg total) by mouth daily before breakfast. 90 tablet 1   LORazepam  (ATIVAN ) 0.5 MG tablet Take 1 tablet (0.5 mg total) by mouth every 8 (eight) hours as needed for anxiety. 30 tablet 0   meclizine  (ANTIVERT ) 12.5 MG tablet Take 1 tablet (12.5 mg total) by mouth 3 (three) times daily as needed for dizziness. 30 tablet 1   pantoprazole  (PROTONIX ) 40 MG tablet Take 1 tablet (40 mg total) by mouth daily. 90 tablet 1   propranolol  ER (INDERAL  LA) 80 MG 24 hr capsule TAKE (1) CAPSULE BY MOUTH AT BEDTIME. 90 capsule 0   rosuvastatin   (CRESTOR ) 10 MG tablet Take 1 tablet (10 mg total) by mouth daily. Discontinue pravastatin  90 tablet 1   scopolamine  (TRANSDERM-SCOP) 1 MG/3DAYS PLACE 1 PATCH ONTO THE SKIN EVERY 3 DAYS. (Patient taking differently: as needed.) 10 patch 0   SYSTANE ULTRA 0.4-0.3 % SOLN Apply 1 drop to eye 2 (two) times daily. (Patient taking differently: Apply 1 drop to eye as needed.)     triamcinolone  cream (KENALOG ) 0.1 % Apply 1 Application topically 2 (two) times daily. (Patient taking differently: Apply 1 Application topically as needed.) 80 g 1   No current facility-administered medications for this visit.    Review of Systems: GENERAL: negative for malaise, night sweats HEENT: No changes in hearing or vision, no nose bleeds or other nasal problems. NECK: Negative for lumps, goiter, pain and significant neck swelling RESPIRATORY: Negative for cough, wheezing CARDIOVASCULAR: Negative for chest pain, leg swelling, palpitations, orthopnea GI: SEE HPI MUSCULOSKELETAL: Negative for joint pain or swelling, back pain, and muscle pain. SKIN: Negative  for lesions, rash PSYCH: Negative for sleep disturbance, mood disorder and recent psychosocial stressors. HEMATOLOGY Negative for prolonged bleeding, bruising easily, and swollen nodes. ENDOCRINE: Negative for cold or heat intolerance, polyuria, polydipsia and goiter. NEURO: negative for tremor, gait imbalance, syncope and seizures. The remainder of the review of systems is noncontributory.   Physical Exam: BP (!) 152/80 (BP Location: Left Arm, Patient Position: Sitting, Cuff Size: Large)   Pulse (!) 45   Temp 97.8 F (36.6 C) (Temporal)   Ht 5' 5 (1.651 m)   Wt 191 lb (86.6 kg)   BMI 31.78 kg/m  GENERAL: The patient is AO x3, in no acute distress. HEENT: Head is normocephalic and atraumatic. EOMI are intact. Mouth is well hydrated and without lesions. NECK: Supple. No masses LUNGS: Clear to auscultation. No presence of rhonchi/wheezing/rales.  Adequate chest expansion HEART: RRR, normal s1 and s2. ABDOMEN: Soft, nontender, no guarding, no peritoneal signs, and nondistended. BS +. No masses. EXTREMITIES: Without any cyanosis, clubbing, rash, lesions or edema. NEUROLOGIC: AOx3, no focal motor deficit. SKIN: no jaundice, no rashes   Imaging/Labs: as above  I personally reviewed and interpreted the available labs, imaging and endoscopic files.  Impression and Plan: Debra [name] is a 73 y.o. female with past medical history of hiatal hernia, osteoarthritis, hypertension, GERD, diverticulosis, who presents for evaluation of heartburn, abdominal pain, flatulence, burping, shortness of breath and hiatal hernia.  Patient has presented issues with shortness of breath with exertion and has also presented some upper abdominal and chest discomfort.  I reviewed the imaging from most recent chest CT which showed significant changes of large hiatal hernia with large amount of the gastric chamber in the chest.  We discussed in detail that these could definitely explain her symptoms.  I explained to her that these will require surgical management if symptoms are significant or severe.  Also explained the risk of gastric volvulus with this type of hernias which she understood.  At this point she would like to hold off on any surgical interventions as she is not having dysphagia and has been able to somewhat manage her symptoms with medication.  However, she will be open to proceed with endoscopic evaluation which will be scheduled.  As her heartburn has been controlled with pantoprazole , she will continue with this medication and can take Pepcid as needed for breakthrough symptoms.  Patient has presence of episode of flatulence and bloating.  I considered this is related to food, for which I advised her to implement a low FODMAP diet.  Burping may also be related to her altered anatomy due to large hiatal hernia.  - Schedule EGD - Continue  pantoprazole  40 mg at night - Can take Pepcid as needed for Breakthrough heartburn episodes - Patient was counseled about the benefit of implementing a low FODMAP to improve symptoms and recurrent episodes. A dietary list was provided to the patient.   All questions were answered.      Toribio Fortune, MD Gastroenterology and Hepatology [facility]

## 2024-09-25 NOTE — Patient Instructions (Addendum)
 Schedule EGD Continue pantoprazole  40 mg at night Can take Pepcid as needed for Breakthrough heartburn episodes  Patient was counseled about the benefit of implementing a low FODMAP to improve symptoms and recurrent episodes. A dietary list was provided to the patient.

## 2024-09-26 ENCOUNTER — Telehealth (INDEPENDENT_AMBULATORY_CARE_PROVIDER_SITE_OTHER): Payer: Self-pay

## 2024-09-26 NOTE — Telephone Encounter (Signed)
 ATC pt, no answer. LVM for call back.

## 2024-09-28 NOTE — Telephone Encounter (Signed)
 PA on [employer] for EGD: Approved Authorization #[id number]  Tracking [id number]

## 2024-09-28 NOTE — Telephone Encounter (Signed)
 Spoke with patient, scheduled EGD for 10/27/2024 at 10:45am. Instructions mailed as requested by patient.

## 2024-10-11 ENCOUNTER — Telehealth: Payer: Self-pay

## 2024-10-11 ENCOUNTER — Other Ambulatory Visit: Payer: Self-pay

## 2024-10-11 DIAGNOSIS — I10 Essential (primary) hypertension: Secondary | ICD-10-CM

## 2024-10-11 DIAGNOSIS — E7800 Pure hypercholesterolemia, unspecified: Secondary | ICD-10-CM

## 2024-10-11 MED ORDER — AMLODIPINE BESYLATE 5 MG PO TABS: 2.5000 mg | ORAL_TABLET | Freq: Every day | ORAL | 1 refills | Status: DC

## 2024-10-11 NOTE — Telephone Encounter (Signed)
 Prescription Request  10/11/2024  LOV: 03/27/24  What is the name of the medication or equipment? amLODipine  (NORVASC ) 5 MG tablet [[id number]]   Have you contacted your pharmacy to request a refill? Yes   Which pharmacy would you like this sent to?  [facility] [place], [place] - [id number] [place] [address] Phone: [phone] Fax: [phone]    Patient notified that their request is being sent to the clinical staff for review and that they should receive a response within 2 business days.   Please advise at [facility] [phone]

## 2024-10-27 ENCOUNTER — Ambulatory Visit (HOSPITAL_COMMUNITY)
Admission: RE | Admit: 2024-10-27 | Discharge: 2024-10-27 | Disposition: A | Discharge status: Home / Self Care | Attending: Gastroenterology | Admitting: Gastroenterology

## 2024-10-27 ENCOUNTER — Encounter (HOSPITAL_COMMUNITY)
Admission: RE | Disposition: A | Discharge status: Home / Self Care | Payer: Self-pay | Source: Home / Self Care | Attending: Gastroenterology

## 2024-10-27 ENCOUNTER — Encounter (HOSPITAL_COMMUNITY): Payer: Self-pay | Admitting: Gastroenterology

## 2024-10-27 ENCOUNTER — Ambulatory Visit (HOSPITAL_COMMUNITY): Payer: Self-pay | Admitting: Certified Registered Nurse Anesthetist

## 2024-10-27 ENCOUNTER — Other Ambulatory Visit: Payer: Self-pay

## 2024-10-27 DIAGNOSIS — F32A Depression, unspecified: Secondary | ICD-10-CM | POA: Insufficient documentation

## 2024-10-27 DIAGNOSIS — Z79890 Hormone replacement therapy (postmenopausal): Secondary | ICD-10-CM | POA: Diagnosis not present

## 2024-10-27 DIAGNOSIS — R109 Unspecified abdominal pain: Secondary | ICD-10-CM | POA: Diagnosis not present

## 2024-10-27 DIAGNOSIS — E039 Hypothyroidism, unspecified: Secondary | ICD-10-CM | POA: Insufficient documentation

## 2024-10-27 DIAGNOSIS — F419 Anxiety disorder, unspecified: Secondary | ICD-10-CM | POA: Diagnosis not present

## 2024-10-27 DIAGNOSIS — M1990 Unspecified osteoarthritis, unspecified site: Secondary | ICD-10-CM | POA: Diagnosis not present

## 2024-10-27 DIAGNOSIS — K5730 Diverticulosis of large intestine without perforation or abscess without bleeding: Secondary | ICD-10-CM | POA: Diagnosis not present

## 2024-10-27 DIAGNOSIS — K449 Diaphragmatic hernia without obstruction or gangrene: Secondary | ICD-10-CM | POA: Diagnosis not present

## 2024-10-27 DIAGNOSIS — K219 Gastro-esophageal reflux disease without esophagitis: Secondary | ICD-10-CM

## 2024-10-27 DIAGNOSIS — K2100 Gastro-esophageal reflux disease with esophagitis, without bleeding: Secondary | ICD-10-CM | POA: Diagnosis not present

## 2024-10-27 DIAGNOSIS — I10 Essential (primary) hypertension: Secondary | ICD-10-CM | POA: Insufficient documentation

## 2024-10-27 HISTORY — PX: ESOPHAGOGASTRODUODENOSCOPY: SHX5428

## 2024-10-27 SURGERY — EGD (ESOPHAGOGASTRODUODENOSCOPY)
Anesthesia: General

## 2024-10-27 MED ORDER — LIDOCAINE 2% (20 MG/ML) 5 ML SYRINGE
INTRAMUSCULAR | Status: AC
  Filled 2024-10-27: qty 5

## 2024-10-27 MED ORDER — PROPOFOL 500 MG/50ML IV EMUL
INTRAVENOUS | Status: DC | PRN
  Administered 2024-10-27: 180 ug/kg/min via INTRAVENOUS
  Administered 2024-10-27: 80 mg via INTRAVENOUS

## 2024-10-27 MED ORDER — PROPOFOL 500 MG/50ML IV EMUL
INTRAVENOUS | Status: AC
  Filled 2024-10-27: qty 50

## 2024-10-27 MED ORDER — LACTATED RINGERS IV SOLN: INTRAVENOUS | Status: DC | PRN

## 2024-10-27 NOTE — H&P (Signed)
 Debra [name] is an 73 y.o. female.   Chief Complaint: SOB, hiatal hernia and abdominal pain HPI: 73 y/o female with past medical history of hiatal hernia, osteoarthritis, hypertension, GERD, diverticulosis, who presents for evaluation of heartburn, abdominal pain, flatulence, burping, shortness of breath and hiatal hernia.   Patient reports that she is having some SOB and abdominal discomfort. The patient denies having any nausea, vomiting, fever, chills, hematochezia, melena, hematemesis, diarrhea, jaundice, pruritus or weight loss.   Past Medical History:  Diagnosis Date   Diverticulitis    Diverticulosis of colon (without mention of hemorrhage)    Esophageal reflux    Generalized anxiety disorder    Hiatal hernia    Hypertension    Iron deficiency anemia 01/19/2012   Mixed hyperlipidemia    OA (osteoarthritis)    PONV (postoperative nausea and vomiting)    Shortness of breath 02/21/2024   Unspecified hypothyroidism     Past Surgical History:  Procedure Laterality Date   ABDOMINAL HYSTERECTOMY     APPENDECTOMY     BOWEL RESECTION  05/11/2012   Procedure: LOW ANTERIOR BOWEL RESECTION;  Surgeon: Oneil DELENA Budge, MD;  Location: [facility];  Service: General;;   CESAREAN SECTION     First one 57 , second on 1984   COLON RESECTION  05/11/2012   Procedure: HAND ASSISTED LAPAROSCOPIC COLON RESECTION;  Surgeon: Oneil DELENA Budge, MD;  Location: [facility];  Service: General;  Laterality: N/A;  Laparoscopic Hand Assisted Partial Colectomy;   COLONOSCOPY  2011-2008   Dr. Budge   COLONOSCOPY  03/10/2012   Procedure: COLONOSCOPY;  Surgeon: Claudis RAYMOND Rivet, MD;  Location: [facility];  Service: Endoscopy;  Laterality: N/A;  200   Hysterctomy  12/07/1986   TONSILLECTOMY      Family History  Problem Relation Age of Onset   Rheum arthritis Mother    Heart disease Mother    Heart attack Brother    Healthy Son    Healthy Son    Anesthesia problems Neg Hx    Hypotension Neg Hx     Malignant hyperthermia Neg Hx    Pseudochol deficiency Neg Hx    Social History:  reports that she has never smoked. She has never used smokeless tobacco. She reports that she does not drink alcohol and does not use drugs.  Allergies:  Allergies  Allergen Reactions   Codeine Nausea And Vomiting   Morphine And Codeine Nausea And Vomiting   Sulfa Antibiotics Hives and Itching    Medications Prior to Admission  Medication Sig Dispense Refill   amLODipine  (NORVASC ) 5 MG tablet Take 0.5 tablets (2.5 mg total) by mouth daily. 45 tablet 1   buPROPion  (WELLBUTRIN  XL) 300 MG 24 hr tablet TAKE ONE TABLET BY MOUTH ONCE DAILY. 90 tablet 1   Cholecalciferol (VITAMIN D3 PO) Take by mouth. (Patient taking differently: Take by mouth daily at 6 (six) AM.)     fluticasone  (FLONASE ) 50 MCG/ACT nasal spray Place 2 sprays into both nostrils daily. (Patient taking differently: Place 2 sprays into both nostrils as needed.) 16 g 6   levothyroxine  (SYNTHROID ) 25 MCG tablet Take 1 tablet (25 mcg total) by mouth daily before breakfast. 90 tablet 1   LORazepam  (ATIVAN ) 0.5 MG tablet Take 1 tablet (0.5 mg total) by mouth every 8 (eight) hours as needed for anxiety. 30 tablet 0   pantoprazole  (PROTONIX ) 40 MG tablet Take 1 tablet (40 mg total) by mouth daily. 90 tablet 1   propranolol  ER (INDERAL   LA) 80 MG 24 hr capsule TAKE (1) CAPSULE BY MOUTH AT BEDTIME. 90 capsule 0   rosuvastatin  (CRESTOR ) 10 MG tablet Take 1 tablet (10 mg total) by mouth daily. Discontinue pravastatin  90 tablet 1   SYSTANE ULTRA 0.4-0.3 % SOLN Apply 1 drop to eye 2 (two) times daily. (Patient taking differently: Apply 1 drop to eye as needed.)     triamcinolone  cream (KENALOG ) 0.1 % Apply 1 Application topically 2 (two) times daily. (Patient taking differently: Apply 1 Application topically as needed.) 80 g 1   buPROPion  (WELLBUTRIN  XL) 150 MG 24 hr tablet Take 1 tablet (150 mg total) by mouth daily. (Patient taking differently: Take 75 mg by  mouth as needed.) 90 tablet 3   clotrimazole -betamethasone  (LOTRISONE ) cream Apply 1 Application topically daily. (Patient taking differently: Apply 1 Application topically as needed.) 30 g 1   meclizine  (ANTIVERT ) 12.5 MG tablet Take 1 tablet (12.5 mg total) by mouth 3 (three) times daily as needed for dizziness. 30 tablet 1   scopolamine  (TRANSDERM-SCOP) 1 MG/3DAYS PLACE 1 PATCH ONTO THE SKIN EVERY 3 DAYS. (Patient taking differently: as needed.) 10 patch 0    No results found for this or any previous visit (from the past 48 hours). No results found.  Review of Systems  Respiratory:  Positive for shortness of breath.   Gastrointestinal:  Positive for abdominal pain.  All other systems reviewed and are negative.   Blood pressure 129/70, pulse 64, temperature 98 F (36.7 C), temperature source Oral, resp. rate 16, height 5' 5 (1.651 m), weight 77.1 kg, SpO2 100%. Physical Exam  GENERAL: The patient is AO x3, in no acute distress. HEENT: Head is normocephalic and atraumatic. EOMI are intact. Mouth is well hydrated and without lesions. NECK: Supple. No masses LUNGS: Clear to auscultation. No presence of rhonchi/wheezing/rales. Adequate chest expansion HEART: RRR, normal s1 and s2. ABDOMEN: Soft, nontender, no guarding, no peritoneal signs, and nondistended. BS +. No masses. EXTREMITIES: Without any cyanosis, clubbing, rash, lesions or edema. NEUROLOGIC: AOx3, no focal motor deficit. SKIN: no jaundice, no rashes  Assessment/Plan 73 y/o female with past medical history of hiatal hernia, osteoarthritis, hypertension, GERD, diverticulosis, who presents for evaluation of heartburn, abdominal pain, flatulence, burping, shortness of breath and hiatal hernia. Will proceed with EGD.   Toribio Eartha Flavors, MD 10/27/2024, 9:24 AM

## 2024-10-27 NOTE — Op Note (Signed)
 [facility] Patient Name: Debra [name] Procedure Date: 10/27/2024 9:16 AM MRN: [id number] Date of Birth: 1951/06/12 Attending MD: Toribio Fortune , , [id number] CSN: [id number] Age: 73 Admit Type: Outpatient Procedure:                Upper GI endoscopy Indications:              Abdominal pain, Gastro-esophageal reflux disease,                            Preoperative assessment, Hiatal hernia Providers:                Toribio Fortune, Olam Ada, RN, Dorcas Lenis,                            Technician Referring MD:              Medicines:                Monitored Anesthesia Care Complications:            No immediate complications. Estimated Blood Loss:     Estimated blood loss: none. Procedure:                Pre-Anesthesia Assessment:                           - Prior to the procedure, a History and Physical                            was performed, and patient medications, allergies                            and sensitivities were reviewed. The patient's                            tolerance of previous anesthesia was reviewed.                           - The risks and benefits of the procedure and the                            sedation options and risks were discussed with the                            patient. All questions were answered and informed                            consent was obtained.                           - ASA Grade Assessment: II - A patient with mild                            systemic disease.                           After obtaining informed consent, the endoscope was  passed under direct vision. Throughout the                            procedure, the patient's blood pressure, pulse, and                            oxygen saturations were monitored continuously. The                            Endoscope was introduced through the mouth, and                            advanced to the second part of duodenum. The upper                             GI endoscopy was accomplished without difficulty.                            The patient tolerated the procedure well. Scope In: 9:32:33 AM Scope Out: 9:38:46 AM Total Procedure Duration: 0 hours 6 minutes 13 seconds  Findings:      A 9 cm paraesophageal hernia was found. At least half of the gastric       chamber was in the hernia sac. The hiatal narrowing was 40 cm from the       incisors. The Z-line was 31 cm from the incisors.      The stomach was normal.      The examined duodenum was normal. Impression:               - 9 cm paraesophageal hernia.                           - Normal stomach.                           - Normal examined duodenum.                           - No specimens collected. Moderate Sedation:      Per Anesthesia Care Recommendation:           - Discharge patient to home (ambulatory).                           - Resume previous diet.                           - Continue present medications.                           - Will refer for [facility] Surgery for                            hernia repair. Procedure Code(s):        --- Professional ---                           [phone], Esophagogastroduodenoscopy,  flexible,                            transoral; diagnostic, including collection of                            specimen(s) by brushing or washing, when performed                            (separate procedure) Diagnosis Code(s):        --- Professional ---                           K44.9, Diaphragmatic hernia without obstruction or                            gangrene                           R10.9, Unspecified abdominal pain                           K21.9, Gastro-esophageal reflux disease without                            esophagitis                           Z01.818, Encounter for other preprocedural                            examination CPT copyright 2022 American Medical Association. All rights reserved. The codes  documented in this report are preliminary and upon coder review may  be revised to meet current compliance requirements. Toribio Fortune, MD Toribio Fortune,  10/27/2024 9:50:59 AM This report has been signed electronically. Number of Addenda: 0

## 2024-10-27 NOTE — Discharge Instructions (Signed)
 You are being discharged to home.  Resume your previous diet.  Continue your present medications.  Will refer for [facility] Surgery for hernia repair.

## 2024-10-27 NOTE — Transfer of Care (Signed)
 Immediate Anesthesia Transfer of Care Note  Patient: Debra [name]  Procedure(s) Performed: EGD (ESOPHAGOGASTRODUODENOSCOPY)  Patient Location: Endoscopy Unit  Anesthesia Type:General  Level of Consciousness: awake, alert , and oriented  Airway & Oxygen Therapy: Patient Spontanous Breathing  Post-op Assessment: Report given to RN, Post -op Vital signs reviewed and stable, Patient moving all extremities X 4, and Patient able to stick tongue midline  Post vital signs: Reviewed and stable  Last Vitals:  Vitals Value Taken Time  BP 109/59 10/27/24 09:46  Temp 36.5 C 10/27/24 09:46  Pulse 50 10/27/24 09:46  Resp 16 10/27/24 09:46  SpO2 94 % 10/27/24 09:46    Last Pain:  Vitals:   10/27/24 0946  TempSrc: Oral  PainSc: 0-No pain      Patients Stated Pain Goal: 10 (10/27/24 0910)  Complications: No notable events documented.

## 2024-10-27 NOTE — Anesthesia Preprocedure Evaluation (Signed)
 Anesthesia Evaluation  Patient identified by MRN, date of birth, ID band Patient awake    Reviewed: Allergy & Precautions, H&P , NPO status , Patient's Chart, lab work & pertinent test results, reviewed documented beta blocker date and time   History of Anesthesia Complications (+) PONV and history of anesthetic complications  Airway Mallampati: II  TM Distance: >3 FB Neck ROM: full    Dental no notable dental hx. (+) Dental Advisory Given, Teeth Intact   Pulmonary neg pulmonary ROS   Pulmonary exam normal breath sounds clear to auscultation       Cardiovascular Exercise Tolerance: Good hypertension, + DOE  Normal cardiovascular exam Rhythm:regular Rate:Normal     Neuro/Psych  PSYCHIATRIC DISORDERS Anxiety Depression    negative neurological ROS     GI/Hepatic Neg liver ROS, hiatal hernia,GERD  ,,  Endo/Other  Hypothyroidism    Renal/GU negative Renal ROS  negative genitourinary   Musculoskeletal  (+) Arthritis , Osteoarthritis,    Abdominal   Peds  Hematology negative hematology ROS (+)   Anesthesia Other Findings   Reproductive/Obstetrics negative OB ROS                              Anesthesia Physical Anesthesia Plan  ASA: 2  Anesthesia Plan: General   Post-op Pain Management: Minimal or no pain anticipated   Induction: Intravenous  PONV Risk Score and Plan: Propofol  infusion  Airway Management Planned: Natural Airway and Nasal Cannula  Additional Equipment: None  Intra-op Plan:   Post-operative Plan:   Informed Consent: I have reviewed the patients History and Physical, chart, labs and discussed the procedure including the risks, benefits and alternatives for the proposed anesthesia with the patient or authorized representative who has indicated his/her understanding and acceptance.     Dental Advisory Given  Plan Discussed with: CRNA  Anesthesia Plan Comments:           Anesthesia Quick Evaluation

## 2024-10-27 NOTE — Anesthesia Postprocedure Evaluation (Signed)
 Anesthesia Post Note  Patient: Debra [name]  Procedure(s) Performed: EGD (ESOPHAGOGASTRODUODENOSCOPY)  Patient location during evaluation: Endoscopy Anesthesia Type: General Level of consciousness: awake and alert Pain management: pain level controlled Vital Signs Assessment: post-procedure vital signs reviewed and stable Respiratory status: spontaneous breathing, nonlabored ventilation and respiratory function stable Cardiovascular status: stable Anesthetic complications: no   There were no known notable events for this encounter.   Last Vitals:  Vitals:   10/27/24 0910 10/27/24 0946  BP: 129/70 (!) 109/59  Pulse: 64 (!) 50  Resp: 16 16  Temp: 36.7 C 36.5 C  SpO2: 100% 94%    Last Pain:  Vitals:   10/27/24 0946  TempSrc: Oral  PainSc: 0-No pain                 Debra [name]

## 2024-10-30 ENCOUNTER — Telehealth (INDEPENDENT_AMBULATORY_CARE_PROVIDER_SITE_OTHER): Payer: Self-pay | Admitting: *Deleted

## 2024-10-30 NOTE — Telephone Encounter (Signed)
 Referral sent, they will contact patient with apt

## 2024-10-30 NOTE — Telephone Encounter (Signed)
-----   Message from Daniel Castaneda Mayorga sent at 10/27/2024 10:31 AM EST ----- Erskin Caldron, can you please refer the patient to [facility]? Dx: large paraesophageal hiatal hernia.  Thanks,   Toribio Fortune, MD Gastroenterology and Hepatology [facility]

## 2024-10-31 ENCOUNTER — Encounter (HOSPITAL_COMMUNITY): Payer: Self-pay | Admitting: Gastroenterology

## 2024-11-08 ENCOUNTER — Telehealth: Payer: Self-pay

## 2024-11-08 NOTE — Telephone Encounter (Signed)
 Prescription Request  11/08/2024  LOV: 03/27/24  What is the name of the medication or equipment? propranolol  ER (INDERAL  LA) 80 MG 24 hr capsule [[id number]]   Have you contacted your pharmacy to request a refill? Yes   Which pharmacy would you like this sent to?  [facility] [place], [place] - [id number] [place] [address] Phone: [phone] Fax: [phone]    Patient notified that their request is being sent to the clinical staff for review and that they should receive a response within 2 business days.   Please advise at [facility] [phone]

## 2024-11-11 MED ORDER — PROPRANOLOL HCL ER 80 MG PO CP24: ORAL_CAPSULE | ORAL | 0 refills | Status: AC

## 2024-11-11 NOTE — Telephone Encounter (Signed)
 Requested Prescriptions  Pending Prescriptions Disp Refills   propranolol  ER (INDERAL  LA) 80 MG 24 hr capsule 90 capsule 0    Sig: TAKE (1) CAPSULE BY MOUTH AT BEDTIME.     Cardiovascular:  Beta Blockers Failed - 11/11/2024  8:38 AM      Failed - Valid encounter within last 6 months    Recent Outpatient Visits           7 months ago Situational depression   [facility] [name] [facility] Duanne, Butler DASEN, MD   8 months ago Shortness of breath   [place] [facility] Aletha Bene, MD   1 year ago Pure hypercholesterolemia   [place] [facility] Pickard, Butler DASEN, MD   1 year ago Pure hypercholesterolemia   [place] [facility] Duanne, Butler DASEN, MD   2 years ago Pure hypercholesterolemia   [place] [facility] Pickard, Butler DASEN, MD              Passed - Last BP in normal range    BP Readings from Last 1 Encounters:  10/27/24 (!) 109/59         Passed - Last Heart Rate in normal range    Pulse Readings from Last 1 Encounters:  10/27/24 (!) 50

## 2024-11-14 ENCOUNTER — Ambulatory Visit: Payer: Self-pay | Admitting: Surgery

## 2024-11-14 ENCOUNTER — Encounter: Payer: Self-pay | Admitting: Surgery

## 2024-11-14 DIAGNOSIS — R140 Abdominal distension (gaseous): Secondary | ICD-10-CM | POA: Diagnosis not present

## 2024-11-14 DIAGNOSIS — D500 Iron deficiency anemia secondary to blood loss (chronic): Secondary | ICD-10-CM | POA: Diagnosis not present

## 2024-11-14 DIAGNOSIS — R0609 Other forms of dyspnea: Secondary | ICD-10-CM | POA: Diagnosis not present

## 2024-11-14 DIAGNOSIS — E66811 Obesity, class 1: Secondary | ICD-10-CM | POA: Diagnosis not present

## 2024-11-14 DIAGNOSIS — K8020 Calculus of gallbladder without cholecystitis without obstruction: Secondary | ICD-10-CM | POA: Diagnosis not present

## 2024-11-14 DIAGNOSIS — K440 Diaphragmatic hernia with obstruction, without gangrene: Secondary | ICD-10-CM | POA: Diagnosis not present

## 2024-11-14 LAB — COMPREHENSIVE METABOLIC PANEL WITH GFR
ALT: 18 IU/L (ref 0–32)
AST: 15 IU/L (ref 0–40)
Albumin: 4.4 g/dL (ref 3.8–4.8)
Alkaline Phosphatase: 59 IU/L (ref 49–135)
BUN/Creatinine Ratio: 21 (ref 12–28)
BUN: 24 mg/dL (ref 8–27)
Bilirubin Total: 0.5 mg/dL (ref 0.0–1.2)
CO2: 22 mmol/L (ref 20–29)
Calcium: 9.8 mg/dL (ref 8.7–10.3)
Chloride: 104 mmol/L (ref 96–106)
Creatinine, Ser: 1.14 mg/dL — ABNORMAL HIGH (ref 0.57–1.00)
Globulin, Total: 2.1 g/dL (ref 1.5–4.5)
Glucose: 96 mg/dL (ref 70–99)
Potassium: 4.8 mmol/L (ref 3.5–5.2)
Sodium: 141 mmol/L (ref 134–144)
Total Protein: 6.5 g/dL (ref 6.0–8.5)
eGFR: 51 mL/min/1.73 — ABNORMAL LOW (ref >59)

## 2024-11-14 LAB — VITAMIN D 25 HYDROXY (VIT D DEFICIENCY, FRACTURES): Vit D, 25-Hydroxy: 41.7 ng/mL (ref 30.0–100.0)

## 2024-11-14 LAB — TSH: TSH: 3.53 u[IU]/mL (ref 0.450–4.500)

## 2024-11-14 LAB — T4, FREE: Free T4: 1.48 ng/dL (ref 0.82–1.77)

## 2024-11-17 ENCOUNTER — Ambulatory Visit (HOSPITAL_COMMUNITY)
Admission: RE | Admit: 2024-11-17 | Discharge: 2024-11-17 | Disposition: A | Source: Ambulatory Visit | Attending: Student

## 2024-11-17 DIAGNOSIS — R918 Other nonspecific abnormal finding of lung field: Secondary | ICD-10-CM

## 2024-11-18 ENCOUNTER — Other Ambulatory Visit: Payer: Self-pay | Admitting: Family Medicine

## 2024-11-18 DIAGNOSIS — F330 Major depressive disorder, recurrent, mild: Secondary | ICD-10-CM

## 2024-11-18 DIAGNOSIS — F411 Generalized anxiety disorder: Secondary | ICD-10-CM

## 2024-11-20 ENCOUNTER — Encounter: Payer: Self-pay | Admitting: "Endocrinology

## 2024-11-20 ENCOUNTER — Ambulatory Visit: Admitting: "Endocrinology

## 2024-11-20 VITALS — BP 122/80 | Ht 65.0 in | Wt 190.0 lb

## 2024-11-20 DIAGNOSIS — E782 Mixed hyperlipidemia: Secondary | ICD-10-CM

## 2024-11-20 DIAGNOSIS — E039 Hypothyroidism, unspecified: Secondary | ICD-10-CM | POA: Diagnosis not present

## 2024-11-20 NOTE — Progress Notes (Signed)
 11/20/2024, 2:19 PM   Endocrinology follow-up note  Subjective:    Patient ID: Debra [name], female    DOB: Dec 13, 1950, PCP Duanne Butler DASEN, MD   Past Medical History:  Diagnosis Date   Diverticulitis    Diverticulosis of colon (without mention of hemorrhage)    Esophageal reflux    Generalized anxiety disorder    Hiatal hernia    Hypertension    Iron deficiency anemia 01/19/2012   Mixed hyperlipidemia    OA (osteoarthritis)    PONV (postoperative nausea and vomiting)    Shortness of breath 02/21/2024   Unspecified hypothyroidism    Past Surgical History:  Procedure Laterality Date   ABDOMINAL HYSTERECTOMY  1998   APPENDECTOMY     BOWEL RESECTION  05/11/2012   Procedure: LOW ANTERIOR BOWEL RESECTION;  Surgeon: Oneil DELENA Budge, MD;  Location: [facility];  Service: General;;   CESAREAN SECTION     First one 26 , second on 1984   COLON RESECTION  05/11/2012   Procedure: HAND ASSISTED LAPAROSCOPIC COLON RESECTION;  Surgeon: Oneil DELENA Budge, MD;  Location: [facility];  Service: General;  Laterality: N/A;  Laparoscopic Hand Assisted Partial Colectomy;   COLONOSCOPY  2011-2008   Dr. Budge   COLONOSCOPY  03/10/2012   Procedure: COLONOSCOPY;  Surgeon: Claudis RAYMOND Rivet, MD;  Location: [facility] END[facility];  Service: Endoscopy;  Laterality: N/A;  200   ESOPHAGOGASTRODUODENOSCOPY N/A 10/27/2024   Procedure: EGD (ESOPHAGOGASTRODUODENOSCOPY);  Surgeon: Eartha Flavors, Toribio, MD;  Location: [facility];  Service: Gastroenterology;  Laterality: N/A;  10:45am, ASA 1-2   TONSILLECTOMY     Social History   Socioeconomic History   Marital status: Married    Spouse name: Not on file   Number of children: Not on file   Years of education: Not on file   Highest education level: Not on file  Occupational History   Not on file  Tobacco Use   Smoking status: Never   Smokeless tobacco: Never  Vaping Use   Vaping status: Never Used   Substance and Sexual Activity   Alcohol use: No   Drug use: No   Sexual activity: Yes    Birth control/protection: Surgical  Other Topics Concern   Not on file  Social History Narrative   Retired Retail buyer.    Social Drivers of Health   Tobacco Use: Low Risk (11/20/2024)   Patient History    Smoking Tobacco Use: Never    Smokeless Tobacco Use: Never    Passive Exposure: Not on file  Financial Resource Strain: Low Risk (11/18/2023)   Overall Financial Resource Strain (CARDIA)    Difficulty of Paying Living Expenses: Not hard at all  Food Insecurity: No Food Insecurity (11/18/2023)   Hunger Vital Sign    Worried About Running Out of Food in the Last Year: Never true    Ran Out of Food in the Last Year: Never true  Transportation Needs: No Transportation Needs (11/18/2023)   PRAPARE - Administrator, Civil Service (Medical): No    Lack of Transportation (Non-Medical): No  Physical Activity: Sufficiently Active (11/18/2023)   Exercise Vital Sign    Days of Exercise per Week: 4 days  Minutes of Exercise per Session: 50 min  Stress: No Stress Concern Present (11/18/2023)   [employer] of Occupational Health - Occupational Stress Questionnaire    Feeling of Stress : Not at all  Social Connections: Moderately Integrated (11/18/2023)   Social Connection and Isolation Panel    Frequency of Communication with Friends and Family: More than three times a week    Frequency of Social Gatherings with Friends and Family: More than three times a week    Attends Religious Services: Never    Database Administrator or Organizations: Yes    Attends Banker Meetings: More than 4 times per year    Marital Status: Married  Depression (PHQ2-9): Low Risk (03/27/2024)   Depression (PHQ2-9)    PHQ-2 Score: 0  Alcohol Screen: Low Risk (11/18/2023)   Alcohol Screen    Last Alcohol Screening Score (AUDIT): 0  Housing: Unknown (11/14/2024)   Received from [facility]   Epic    Unable to Pay for Housing in the Last Year: Not on file    Number of Times Moved in the Last Year: Not on file    At any time in the past 12 months, were you homeless or living in a shelter (including now)?: No  Utilities: Not At Risk (11/18/2023)   [facility] Utilities    Threatened with loss of utilities: No  Health Literacy: Adequate Health Literacy (11/18/2023)   B1300 Health Literacy    Frequency of need for help with medical instructions: Never   Family History  Problem Relation Age of Onset   Rheum arthritis Mother    Heart disease Mother    Heart attack Brother    Healthy Son    Healthy Son    Anesthesia problems Neg Hx    Hypotension Neg Hx    Malignant hyperthermia Neg Hx    Pseudochol deficiency Neg Hx    Outpatient Encounter Medications as of 11/20/2024  Medication Sig   amLODipine  (NORVASC ) 5 MG tablet Take 0.5 tablets (2.5 mg total) by mouth daily.   buPROPion  (WELLBUTRIN  XL) 150 MG 24 hr tablet Take 1 tablet (150 mg total) by mouth daily. (Patient taking differently: Take 75 mg by mouth as needed.)   buPROPion  (WELLBUTRIN  XL) 300 MG 24 hr tablet TAKE ONE TABLET BY MOUTH EVERY DAY   Cholecalciferol (VITAMIN D3 PO) Take by mouth. (Patient taking differently: Take by mouth daily at 6 (six) AM.)   clotrimazole -betamethasone  (LOTRISONE ) cream Apply 1 Application topically daily. (Patient taking differently: Apply 1 Application topically as needed.)   fluticasone  (FLONASE ) 50 MCG/ACT nasal spray Place 2 sprays into both nostrils daily. (Patient taking differently: Place 2 sprays into both nostrils as needed.)   levothyroxine  (SYNTHROID ) 25 MCG tablet Take 1 tablet (25 mcg total) by mouth daily before breakfast.   LORazepam  (ATIVAN ) 0.5 MG tablet Take 1 tablet (0.5 mg total) by mouth every 8 (eight) hours as needed for anxiety.   meclizine  (ANTIVERT ) 12.5 MG tablet Take 1 tablet (12.5 mg total) by mouth 3 (three) times daily as needed for  dizziness.   pantoprazole  (PROTONIX ) 40 MG tablet Take 1 tablet (40 mg total) by mouth daily.   propranolol  ER (INDERAL  LA) 80 MG 24 hr capsule TAKE (1) CAPSULE BY MOUTH AT BEDTIME.   rosuvastatin  (CRESTOR ) 10 MG tablet Take 1 tablet (10 mg total) by mouth daily. Discontinue pravastatin    scopolamine  (TRANSDERM-SCOP) 1 MG/3DAYS PLACE 1 PATCH ONTO THE SKIN EVERY 3 DAYS. (Patient taking  differently: as needed.)   SYSTANE ULTRA 0.4-0.3 % SOLN Apply 1 drop to eye 2 (two) times daily. (Patient taking differently: Apply 1 drop to eye as needed.)   triamcinolone  cream (KENALOG ) 0.1 % Apply 1 Application topically 2 (two) times daily. (Patient taking differently: Apply 1 Application topically as needed.)   No facility-administered encounter medications on file as of 11/20/2024.   ALLERGIES: Allergies  Allergen Reactions   Codeine Nausea And Vomiting   Morphine And Codeine Nausea And Vomiting   Sulfa Antibiotics Hives and Itching    VACCINATION STATUS: Immunization History  Administered Date(s) Administered   Fluad Quad(high Dose 65+) 08/18/2019, 09/13/2020, 08/25/2023   INFLUENZA, HIGH DOSE SEASONAL PF 09/20/2018   Influenza Split 10/21/2013   Influenza,inj,Quad PF,6+ Mos 08/19/2015   Influenza-Unspecified 09/12/2007, 09/20/2021, 09/04/2022   PFIZER(Purple Top)SARS-COV-2 Vaccination 01/12/2020, 02/06/2020, 09/05/2020, 08/25/2023   Pfizer Covid-19 Vaccine Bivalent Booster 16yrs & up 09/20/2021   Pfizer(Comirnaty)Fall Seasonal Vaccine 12 years and older 09/04/2022   Pneumococcal Conjugate-13 08/19/2015   Pneumococcal Polysaccharide-23 02/09/2014   RSV,unspecified 09/04/2022   Tdap 05/07/2013   Zoster, Live 07/08/2013    HPI Debra [name] is 73 y.o. female who is returning to follow-up for hypothyroidism, multinodular goiter.   PMD: Duanne Butler DASEN, MD. -She was put on low-dose levothyroxine  for hypothyroidism during her last visit.  She presents for follow-up with repeat thyroid   function tests consistent with treatment effect.   She has no new complaints. She denies any prior history of thyroid  surgery nor ablation.   She reports various forms of thyroid  dysfunction in her father members including her mother and 2 aunts.  Her October 29, 2020 thyroid  ultrasound is also unremarkable.  Her repeat previsit thyroid  ultrasound shows involution of most of the nodules with no concerning new findings. -She denies any dysphagia, shortness of breath, nor voice change.  She denies tremors, heat intolerance, palpitations. she would like to achieve some weight loss.  She could not afford to be prescription for Zepbound .  She is scheduled to have hiatal hernia surgery.   She denies any family history of thyroid  malignancy. She remains on Crestor  10 mg p.o. nightly for hyperlipidemia.    Review of Systems Progressive weight loss.  Objective:    BP 122/80   Ht 5' 5 (1.651 m)   Wt 190 lb (86.2 kg)   BMI 31.62 kg/m   Wt Readings from Last 3 Encounters:  11/20/24 190 lb (86.2 kg)  10/27/24 170 lb (77.1 kg)  09/25/24 191 lb (86.6 kg)    Physical Exam   CMP ( most recent) CMP     Component Value Date/Time   NA 141 11/13/2024 0804   K 4.8 11/13/2024 0804   CL 104 11/13/2024 0804   CO2 22 11/13/2024 0804   GLUCOSE 96 11/13/2024 0804   GLUCOSE 96 02/21/2024 1523   BUN 24 11/13/2024 0804   CREATININE 1.14 (H) 11/13/2024 0804   CREATININE 1.17 (H) 02/21/2024 1523   CALCIUM  9.8 11/13/2024 0804   PROT 6.5 11/13/2024 0804   ALBUMIN 4.4 11/13/2024 0804   AST 15 11/13/2024 0804   ALT 18 11/13/2024 0804   ALKPHOS 59 11/13/2024 0804   BILITOT 0.5 11/13/2024 0804   GFRNONAA 53 (L) 04/29/2021 1259   GFRAA 62 04/29/2021 1259    Lipid Panel     Component Value Date/Time   CHOL 163 07/28/2024 0803   TRIG 109 07/28/2024 0803   HDL 59 07/28/2024 0803   CHOLHDL 2.8 07/28/2024 0803  CHOLHDL 2.4 05/12/2023 0813   VLDL 27 07/27/2017 0801   LDLCALC 84 07/28/2024 0803    LDLCALC 78 05/12/2023 0813      Lab Results  Component Value Date   TSH 3.530 11/13/2024   TSH 6.360 (H) 07/28/2024   TSH 2.75 02/21/2024   TSH 2.730 07/20/2023   TSH 3.43 05/12/2023   TSH 2.810 01/19/2023   TSH 3.540 07/13/2022   TSH 2.280 11/14/2021   TSH 4.010 04/29/2021   TSH 2.370 11/05/2020   FREET4 1.48 11/13/2024   FREET4 1.30 07/28/2024   FREET4 1.46 07/20/2023   FREET4 1.39 01/19/2023   FREET4 1.38 07/13/2022   FREET4 1.50 11/14/2021   FREET4 1.34 04/29/2021   FREET4 1.36 11/05/2020   FREET4 1.2 05/21/2020   FREET4 1.3 08/16/2019  August 25, 2019 thyroid  ultrasound IMPRESSION: 1. Findings suggestive of multinodular goiter. 2. Nodule #4 which is 1.2 cm meets imaging criteria to recommend a 1 year follow-up as clinically indicated.   October 29, 2020 thyroid  ultrasound. Right lobe measures 4.3 cm, left lobe measured 4.4 cm with 1.4 cm left lobe nodule remained stable. IMPRESSION: Multinodular thyroid  again demonstrated.   Left inferior thyroid  nodule (labeled 4, 1.4 cm, TR 4) and right inferior thyroid  nodule (labeled 6, 1.1 cm, TR 4) both meet criteria for surveillance, as designated by the newly established ACR TI-RADS criteria. Surveillance ultrasound study recommended to be performed annually up to 5 years.  Surveillance thyroid /neck ultrasound on December 16, 2022 IMPRESSION: 1. Interval involution and morphology change of the left inferior thyroid  nodule. On today's examination, this nodule no longer meets criteria for continued imaging surveillance. No further follow-up recommended. 2. Interval involution of the previously identified right inferior thyroid  nodule. The nodule is no longer visible and has likely resolved. 3. Additional nodules again noted incidentally which do not meet criteria for biopsy or imaging surveillance.  Assessment & Plan:   1. Multinodular goiter 2.  Hypothyroidism 3.  Hyperlipidemia 4.  Class I obesity  Her  previsit thyroid  function tests are consistent with treatment effect.  She will continue to benefit from low-dose levothyroxine .  I advised her to continue levothyroxine  25 mg p.o. daily before breakfast.     - We discussed about the correct intake of her thyroid  hormone, on empty stomach at fasting, with water , separated by at least 30 minutes from breakfast and other medications,  and separated by more than 4 hours from calcium , iron, multivitamins, acid reflux medications (PPIs). -Patient is made aware of the fact that thyroid  hormone replacement is needed for life, dose to be adjusted by periodic monitoring of thyroid  function tests.   -She is responding to Crestor  as LDL at 84.  She will continue to benefit from Crestor , advised to continue  Crestor  10 mg p.o. nightly.   Side effects and precautions discussed with her. Patient would like to achieve some weight loss, maximize her personal lifestyle change efforts.  She did not afford GLP-1 RA.  - Based on reviews of her previous thyroid  ultrasound reports, she has multinodular goiter, stable over time.    - I advised her  to maintain close follow up with Duanne Butler DASEN, MD for primary care needs.  I spent  22  minutes in the care of the patient today including review of labs from Thyroid  Function, CMP, and other relevant labs ; imaging/biopsy records (current and previous including abstractions from other facilities); face-to-face time discussing  her lab results and symptoms, medications doses, her options of short  and long term treatment based on the latest standards of care / guidelines;   and documenting the encounter.  Debra [name]  participated in the discussions, expressed understanding, and voiced agreement with the above plans.  All questions were answered to her satisfaction. she is encouraged to contact clinic should she have any questions or concerns prior to her return visit.   Follow up plan: Return in about 6 months  (around 05/21/2025) for F/U with Pre-visit Labs.   Ranny Earl, MD [facility] [address] [place], [place] [address] Phone: [phone]  Fax: [phone]     11/20/2024, 2:19 PM  This note was partially dictated with voice recognition software. Similar sounding words can be transcribed inadequately or may not  be corrected upon review.

## 2024-11-21 ENCOUNTER — Ambulatory Visit: Admitting: Family Medicine

## 2024-11-21 ENCOUNTER — Encounter: Payer: Self-pay | Admitting: Family Medicine

## 2024-11-21 VITALS — BP 120/70 | HR 53 | Temp 97.8°F | Ht 65.0 in | Wt 189.0 lb

## 2024-11-21 DIAGNOSIS — E7800 Pure hypercholesterolemia, unspecified: Secondary | ICD-10-CM

## 2024-11-21 DIAGNOSIS — I10 Essential (primary) hypertension: Secondary | ICD-10-CM

## 2024-11-21 DIAGNOSIS — Z0001 Encounter for general adult medical examination with abnormal findings: Secondary | ICD-10-CM

## 2024-11-21 DIAGNOSIS — Z0000 Encounter for general adult medical examination without abnormal findings: Secondary | ICD-10-CM | POA: Insufficient documentation

## 2024-11-21 LAB — CBC WITH DIFFERENTIAL/PLATELET
Absolute Lymphocytes: 1766 {cells}/uL (ref 850–3900)
Absolute Monocytes: 373 {cells}/uL (ref 200–950)
Basophils Absolute: 49 {cells}/uL (ref 0–200)
Basophils Relative: 0.9 %
Eosinophils Absolute: 92 {cells}/uL (ref 15–500)
Eosinophils Relative: 1.7 %
HCT: 45.4 % (ref 35.9–46.0)
Hemoglobin: 14.9 g/dL (ref 11.7–15.5)
MCH: 30.9 pg (ref 27.0–33.0)
MCHC: 32.8 g/dL (ref 31.6–35.4)
MCV: 94.2 fL (ref 81.4–101.7)
MPV: 10.9 fL (ref 7.5–12.5)
Monocytes Relative: 6.9 %
Neutro Abs: 3121 {cells}/uL (ref 1500–7800)
Neutrophils Relative %: 57.8 %
Platelets: 222 Thousand/uL (ref 140–400)
RBC: 4.82 Million/uL (ref 3.80–5.10)
RDW: 12.7 % (ref 11.0–15.0)
Total Lymphocyte: 32.7 %
WBC: 5.4 Thousand/uL (ref 3.8–10.8)

## 2024-11-21 NOTE — Progress Notes (Signed)
 No chief complaint on file.    Subjective:   Debra [name] is a 73 y.o. female who presents for a Medicare Annual Wellness Visit.  Fall Screening Falls in the past year?: 0 Number of falls in past year: 0 Was there an injury with Fall?: 0 Fall Risk Category Calculator: 0 Patient Fall Risk Level: Low fall risk  Fall Risk Patient at Risk for Falls Due to: No Fall Risks Fall risk Follow up: Falls prevention discussed; Falls evaluation completed  Advance Directives (For Healthcare) Does Patient Have a Medical Advance Directive?: Yes Type of Advance Directive: Healthcare Power of [place]; Living will    Allergies (verified) Codeine, Morphine and codeine, and Sulfa antibiotics   Current Medications (verified) Outpatient Encounter Medications as of 11/21/2024  Medication Sig   amLODipine  (NORVASC ) 5 MG tablet Take 0.5 tablets (2.5 mg total) by mouth daily.   buPROPion  (WELLBUTRIN  XL) 150 MG 24 hr tablet Take 1 tablet (150 mg total) by mouth daily. (Patient taking differently: Take 75 mg by mouth as needed.)   buPROPion  (WELLBUTRIN  XL) 300 MG 24 hr tablet TAKE ONE TABLET BY MOUTH EVERY DAY   Cholecalciferol (VITAMIN D3 PO) Take by mouth. (Patient taking differently: Take by mouth daily at 6 (six) AM.)   clotrimazole -betamethasone  (LOTRISONE ) cream Apply 1 Application topically daily. (Patient taking differently: Apply 1 Application topically as needed.)   fluticasone  (FLONASE ) 50 MCG/ACT nasal spray Place 2 sprays into both nostrils daily. (Patient taking differently: Place 2 sprays into both nostrils as needed.)   levothyroxine  (SYNTHROID ) 25 MCG tablet Take 1 tablet (25 mcg total) by mouth daily before breakfast.   LORazepam  (ATIVAN ) 0.5 MG tablet Take 1 tablet (0.5 mg total) by mouth every 8 (eight) hours as needed for anxiety.   meclizine  (ANTIVERT ) 12.5 MG tablet Take 1 tablet (12.5 mg total) by mouth 3 (three) times daily as needed for dizziness.   pantoprazole  (PROTONIX )  40 MG tablet Take 1 tablet (40 mg total) by mouth daily.   propranolol  ER (INDERAL  LA) 80 MG 24 hr capsule TAKE (1) CAPSULE BY MOUTH AT BEDTIME.   rosuvastatin  (CRESTOR ) 10 MG tablet Take 1 tablet (10 mg total) by mouth daily. Discontinue pravastatin    scopolamine  (TRANSDERM-SCOP) 1 MG/3DAYS PLACE 1 PATCH ONTO THE SKIN EVERY 3 DAYS. (Patient taking differently: as needed.)   SYSTANE ULTRA 0.4-0.3 % SOLN Apply 1 drop to eye 2 (two) times daily. (Patient taking differently: Apply 1 drop to eye as needed.)   triamcinolone  cream (KENALOG ) 0.1 % Apply 1 Application topically 2 (two) times daily. (Patient taking differently: Apply 1 Application topically as needed.)   No facility-administered encounter medications on file as of 11/21/2024.    History: Past Medical History:  Diagnosis Date   Diverticulitis    Diverticulosis of colon (without mention of hemorrhage)    Esophageal reflux    Generalized anxiety disorder    Hiatal hernia    Hypertension    Iron deficiency anemia 01/19/2012   Mixed hyperlipidemia    OA (osteoarthritis)    PONV (postoperative nausea and vomiting)    Shortness of breath 02/21/2024   Unspecified hypothyroidism    Past Surgical History:  Procedure Laterality Date   ABDOMINAL HYSTERECTOMY  1998   APPENDECTOMY     BOWEL RESECTION  05/11/2012   Procedure: LOW ANTERIOR BOWEL RESECTION;  Surgeon: Oneil DELENA Budge, MD;  Location: [facility];  Service: General;;   CESAREAN SECTION     First one 37 , second on 1984  COLON RESECTION  05/11/2012   Procedure: HAND ASSISTED LAPAROSCOPIC COLON RESECTION;  Surgeon: Oneil DELENA Budge, MD;  Location: [facility];  Service: General;  Laterality: N/A;  Laparoscopic Hand Assisted Partial Colectomy;   COLONOSCOPY  2011-2008   Dr. Budge   COLONOSCOPY  03/10/2012   Procedure: COLONOSCOPY;  Surgeon: Claudis RAYMOND Rivet, MD;  [facility];  Service: Endoscopy;  Laterality: N/A;  200   ESOPHAGOGASTRODUODENOSCOPY N/A 10/27/2024    Procedure: EGD (ESOPHAGOGASTRODUODENOSCOPY);  Surgeon: Eartha Flavors, Toribio, MD;  [facility];  Service: Gastroenterology;  Laterality: N/A;  10:45am, ASA 1-2   TONSILLECTOMY     Family History  Problem Relation Age of Onset   Rheum arthritis Mother    Heart disease Mother    Heart attack Brother    Healthy Son    Healthy Son    Anesthesia problems Neg Hx    Hypotension Neg Hx    Malignant hyperthermia Neg Hx    Pseudochol deficiency Neg Hx    Social History   Occupational History   Not on file  Tobacco Use   Smoking status: Never   Smokeless tobacco: Never  Vaping Use   Vaping status: Never Used  Substance and Sexual Activity   Alcohol use: No   Drug use: No   Sexual activity: Yes    Birth control/protection: Surgical   Tobacco Counseling Counseling given: Not Answered  SDOH Screenings   Food Insecurity: No Food Insecurity (11/18/2023)  Housing: Unknown (11/14/2024)   Received from [facility]  Transportation Needs: No Transportation Needs (11/18/2023)  Utilities: Not At Risk (11/18/2023)  Alcohol Screen: Low Risk (11/18/2023)  Depression (PHQ2-9): Low Risk (03/27/2024)  Financial Resource Strain: Low Risk (11/18/2023)  Physical Activity: Sufficiently Active (11/18/2023)  Social Connections: Moderately Integrated (11/18/2023)  Stress: No Stress Concern Present (11/18/2023)  Tobacco Use: Low Risk (11/20/2024)  Health Literacy: Adequate Health Literacy (11/18/2023)   See flowsheets for full screening details  Depression Screen PHQ 2 & 9 Depression Scale- Over the past 2 weeks, how often have you been bothered by any of the following problems? Little interest or pleasure in doing things: 0 Feeling down, depressed, or hopeless (PHQ Adolescent also includes...irritable): 0 PHQ-2 Total Score: 0 Trouble falling or staying asleep, or sleeping too much: 0 Feeling tired or having little energy: 0 Poor appetite or overeating (PHQ  Adolescent also includes...weight loss): 0 Feeling bad about yourself - or that you are a failure or have let yourself or your family down: 0 Trouble concentrating on things, such as reading the newspaper or watching television (PHQ Adolescent also includes...like school work): 0 Moving or speaking so slowly that other people could have noticed. Or the opposite - being so fidgety or restless that you have been moving around a lot more than usual: 0 Thoughts that you would be better off dead, or of hurting yourself in some way: 0 PHQ-9 Total Score: 0 If you checked off any problems, how difficult have these problems made it for you to do your work, take care of things at home, or get along with other people?: Not difficult at all     Goals Addressed   None          Objective:    There were no vitals filed for this visit. There is no height or weight on file to calculate BMI.  Hearing/Vision screen No results found. Immunizations and Health Maintenance Health Maintenance  Topic Date Due   Zoster Vaccines-  Shingrix (1 of 2) 08/17/2000   Colonoscopy  03/10/2022   DTaP/Tdap/Td (2 - Td or Tdap) 05/08/2023   Influenza Vaccine  07/07/2024   COVID-19 Vaccine (7 - 2025-26 season) 08/07/2024   Medicare Annual Wellness (AWV)  11/17/2024   Pneumococcal Vaccine: 50+ Years (3 of 3 - PCV20 or PCV21) 11/23/2024 (Originally 08/18/2020)   Mammogram  05/17/2026   Bone Density Scan  Completed   Hepatitis C Screening  Completed   Meningococcal B Vaccine  Aged Out        Assessment/Plan:  This is a routine wellness examination for Skylynn.  Patient Care Team: Duanne Butler DASEN, MD as PCP - General (Family Medicine) Mallipeddi, Diannah SQUIBB, MD as PCP - Cardiology (Cardiology) Johnson, Laymon CHRISTELLA RIGGERS as Physician Assistant (Cardiology) Eartha Flavors, Toribio, MD as Consulting Physician (Gastroenterology) Sheldon Standing, MD as Consulting Physician (General Surgery) Lavonia Lye, MD as  Consulting Physician (Ophthalmology) Lenis Ethelle ORN, MD as Consulting Physician (Endocrinology)  I have personally reviewed and noted the following in the patients chart:   Medical and social history Use of alcohol, tobacco or illicit drugs  Current medications and supplements including opioid prescriptions. Functional ability and status Nutritional status Physical activity Advanced directives List of other physicians Hospitalizations, surgeries, and ER visits in previous 12 months Vitals Screenings to include cognitive, depression, and falls Referrals and appointments  No orders of the defined types were placed in this encounter.  In addition, I have reviewed and discussed with patient certain preventive protocols, quality metrics, and best practice recommendations. A written personalized care plan for preventive services as well as general preventive health recommendations were provided to patient.   Ronal Slater MARLA Angelena, LPN   87/83/7974   Return in 1 year (on 11/21/2025).  After Visit Summary: (In Person-Printed) AVS printed and given to the patient  Nurse Notes: Pt states she has received flu and zoster vaccines.

## 2024-11-21 NOTE — Progress Notes (Signed)
 Subjective:    Patient ID: Debra [name], female    DOB: [birth year], 73 y.o.   MRN: [id number]  HPI Patient is a very pleasant 73 year old Caucasian female who is here today for complete physical exam.  Mammogram was performed 05/17/24.  Cologuard was negative in 12/2023.  Last DEXA was 2018.  Since I last saw the patient, she has been dealing with dyspnea on exertion.  Stress test was negative.  Echocardiogram is normal.  They have ascertained that the patient likely has a hiatal hernia causing her shortness of breath.  She is scheduling to have surgery to correct the hiatal hernia.  She will need to switch to liquid levothyroxine .  She will also need to switch to immediate release Wellbutrin  so that she can crush this after her surgery when she is on a liquid diet.  Otherwise she is doing well.  She does have a CAT scan pending to evaluate her right sided pulmonary nodule.  This has been done but the report is still pending.  Recent Labs: No visits with results within 1 Month(s) from this visit.  Latest known visit with results is:  Office Visit on 08/10/2024  Component Date Value Ref Range Status   TSH 11/13/2024 3.530  0.450 - 4.500 uIU/mL Final   Free T4 11/13/2024 1.48  0.82 - 1.77 ng/dL Final   Glucose 87/91/7974 96  70 - 99 mg/dL Final   BUN 87/91/7974 24  8 - 27 mg/dL Final   Creatinine, Ser 11/13/2024 1.14 (H)  0.57 - 1.00 mg/dL Final   eGFR 87/91/7974 51 (L)  >59 mL/min/1.73 Final   BUN/Creatinine Ratio 11/13/2024 21  12 - 28 Final   Sodium 11/13/2024 141  134 - 144 mmol/L Final   Potassium 11/13/2024 4.8  3.5 - 5.2 mmol/L Final   Chloride 11/13/2024 104  96 - 106 mmol/L Final   CO2 11/13/2024 22  20 - 29 mmol/L Final   Calcium  11/13/2024 9.8  8.7 - 10.3 mg/dL Final   Total Protein 87/91/7974 6.5  6.0 - 8.5 g/dL Final   Albumin 87/91/7974 4.4  3.8 - 4.8 g/dL Final   Globulin, Total 11/13/2024 2.1  1.5 - 4.5 g/dL Final   Bilirubin Total 11/13/2024 0.5  0.0 - 1.2 mg/dL Final    Alkaline Phosphatase 11/13/2024 59  49 - 135 IU/L Final   AST 11/13/2024 15  0 - 40 IU/L Final   ALT 11/13/2024 18  0 - 32 IU/L Final   Vit D, 25-Hydroxy 11/13/2024 41.7  30.0 - 100.0 ng/mL Final   Comment: Vitamin D  deficiency has been defined by the Institute of Medicine and an Endocrine Society practice guideline as a level of serum 25-OH vitamin D  less than 20 ng/mL (1,2). The Endocrine Society went on to further define vitamin D  insufficiency as a level between 21 and 29 ng/mL (2). 1. IOM (Institute of Medicine). 2010. Dietary reference    intakes for calcium  and D. [place]: The    [employer]. 2. Holick MF, Binkley [place], Bischoff-Ferrari HA, et al.    Evaluation, treatment, and prevention of vitamin D     deficiency: an Endocrine Society clinical practice    guideline. JCEM. 2011 Jul; 96(7):1911-30.    Immunization History  Administered Date(s) Administered   Fluad Quad(high Dose 65+) 08/18/2019, 09/13/2020, 08/25/2023   INFLUENZA, HIGH DOSE SEASONAL PF 09/20/2018   Influenza Split 10/21/2013   Influenza,inj,Quad PF,6+ Mos 08/19/2015   Influenza-Unspecified 09/12/2007, 09/20/2021, 09/04/2022   PFIZER(Purple Top)SARS-COV-2 Vaccination  01/12/2020, 02/06/2020, 09/05/2020, 08/25/2023   Pfizer Covid-19 Vaccine Bivalent Booster 1yrs & up 09/20/2021   Pfizer(Comirnaty)Fall Seasonal Vaccine 12 years and older 09/04/2022   Pneumococcal Conjugate-13 08/19/2015   Pneumococcal Polysaccharide-23 02/09/2014   RSV,unspecified 09/04/2022   Tdap 05/07/2013   Zoster, Live 07/08/2013       Past Medical History:  Diagnosis Date   Diverticulitis    Diverticulosis of colon (without mention of hemorrhage)    Esophageal reflux    Generalized anxiety disorder    Hiatal hernia    Hypertension    Iron deficiency anemia 01/19/2012   Mixed hyperlipidemia    OA (osteoarthritis)    PONV (postoperative nausea and vomiting)    Shortness of breath 02/21/2024   Unspecified  hypothyroidism    Past Surgical History:  Procedure Laterality Date   ABDOMINAL HYSTERECTOMY  1998   APPENDECTOMY     BOWEL RESECTION  05/11/2012   Procedure: LOW ANTERIOR BOWEL RESECTION;  Surgeon: Oneil DELENA Budge, MD;  Location: [facility];  Service: General;;   CESAREAN SECTION     First one 19 , second on 1984   COLON RESECTION  05/11/2012   Procedure: HAND ASSISTED LAPAROSCOPIC COLON RESECTION;  Surgeon: Oneil DELENA Budge, MD;  Location: [facility];  Service: General;  Laterality: N/A;  Laparoscopic Hand Assisted Partial Colectomy;   COLONOSCOPY  2011-2008   Dr. Budge   COLONOSCOPY  03/10/2012   Procedure: COLONOSCOPY;  Surgeon: Claudis RAYMOND Rivet, MD;  [facility];  Service: Endoscopy;  Laterality: N/A;  200   ESOPHAGOGASTRODUODENOSCOPY N/A 10/27/2024   Procedure: EGD (ESOPHAGOGASTRODUODENOSCOPY);  Surgeon: Eartha Flavors, Toribio, MD;  [facility];  Service: Gastroenterology;  Laterality: N/A;  10:45am, ASA 1-2   TONSILLECTOMY     Current Outpatient Medications on File Prior to Visit  Medication Sig Dispense Refill   amLODipine  (NORVASC ) 5 MG tablet Take 0.5 tablets (2.5 mg total) by mouth daily. 45 tablet 1   buPROPion  (WELLBUTRIN  XL) 150 MG 24 hr tablet Take 1 tablet (150 mg total) by mouth daily. (Patient taking differently: Take 75 mg by mouth as needed.) 90 tablet 3   buPROPion  (WELLBUTRIN  XL) 300 MG 24 hr tablet TAKE ONE TABLET BY MOUTH EVERY DAY 90 tablet 1   Cholecalciferol (VITAMIN D3 PO) Take by mouth. (Patient taking differently: Take by mouth daily at 6 (six) AM.)     clotrimazole -betamethasone  (LOTRISONE ) cream Apply 1 Application topically daily. (Patient taking differently: Apply 1 Application topically as needed.) 30 g 1   fluticasone  (FLONASE ) 50 MCG/ACT nasal spray Place 2 sprays into both nostrils daily. (Patient taking differently: Place 2 sprays into both nostrils as needed.) 16 g 6   levothyroxine  (SYNTHROID ) 25 MCG tablet Take 1 tablet (25 mcg  total) by mouth daily before breakfast. 90 tablet 1   LORazepam  (ATIVAN ) 0.5 MG tablet Take 1 tablet (0.5 mg total) by mouth every 8 (eight) hours as needed for anxiety. 30 tablet 0   meclizine  (ANTIVERT ) 12.5 MG tablet Take 1 tablet (12.5 mg total) by mouth 3 (three) times daily as needed for dizziness. 30 tablet 1   pantoprazole  (PROTONIX ) 40 MG tablet Take 1 tablet (40 mg total) by mouth daily. 90 tablet 1   propranolol  ER (INDERAL  LA) 80 MG 24 hr capsule TAKE (1) CAPSULE BY MOUTH AT BEDTIME. 90 capsule 0   rosuvastatin  (CRESTOR ) 10 MG tablet Take 1 tablet (10 mg total) by mouth daily. Discontinue pravastatin  90 tablet 1   scopolamine  (TRANSDERM-SCOP) 1 MG/3DAYS  PLACE 1 PATCH ONTO THE SKIN EVERY 3 DAYS. (Patient taking differently: as needed.) 10 patch 0   SYSTANE ULTRA 0.4-0.3 % SOLN Apply 1 drop to eye 2 (two) times daily. (Patient taking differently: Apply 1 drop to eye as needed.)     triamcinolone  cream (KENALOG ) 0.1 % Apply 1 Application topically 2 (two) times daily. (Patient taking differently: Apply 1 Application topically as needed.) 80 g 1   No current facility-administered medications on file prior to visit.   Allergies  Allergen Reactions   Codeine Nausea And Vomiting   Morphine And Codeine Nausea And Vomiting   Sulfa Antibiotics Hives and Itching   Social History   Socioeconomic History   Marital status: Married    Spouse name: Not on file   Number of children: Not on file   Years of education: Not on file   Highest education level: Not on file  Occupational History   Not on file  Tobacco Use   Smoking status: Never   Smokeless tobacco: Never  Vaping Use   Vaping status: Never Used  Substance and Sexual Activity   Alcohol use: No   Drug use: No   Sexual activity: Yes    Birth control/protection: Surgical  Other Topics Concern   Not on file  Social History Narrative   Retired Retail buyer.    Social Drivers of Health   Tobacco Use: Low Risk (11/20/2024)    Patient History    Smoking Tobacco Use: Never    Smokeless Tobacco Use: Never    Passive Exposure: Not on file  Financial Resource Strain: Low Risk (11/18/2023)   Overall Financial Resource Strain (CARDIA)    Difficulty of Paying Living Expenses: Not hard at all  Food Insecurity: No Food Insecurity (11/18/2023)   Hunger Vital Sign    Worried About Running Out of Food in the Last Year: Never true    Ran Out of Food in the Last Year: Never true  Transportation Needs: No Transportation Needs (11/18/2023)   PRAPARE - Administrator, Civil Service (Medical): No    Lack of Transportation (Non-Medical): No  Physical Activity: Sufficiently Active (11/18/2023)   Exercise Vital Sign    Days of Exercise per Week: 4 days    Minutes of Exercise per Session: 50 min  Stress: No Stress Concern Present (11/18/2023)   [employer] of Occupational Health - Occupational Stress Questionnaire    Feeling of Stress : Not at all  Social Connections: Moderately Integrated (11/18/2023)   Social Connection and Isolation Panel    Frequency of Communication with Friends and Family: More than three times a week    Frequency of Social Gatherings with Friends and Family: More than three times a week    Attends Religious Services: Never    Database Administrator or Organizations: Yes    Attends Engineer, Structural: More than 4 times per year    Marital Status: Married  Catering Manager Violence: Not At Risk (11/18/2023)   Humiliation, Afraid, Rape, and Kick questionnaire    Fear of Current or Ex-Partner: No    Emotionally Abused: No    Physically Abused: No    Sexually Abused: No  Depression (PHQ2-9): Low Risk (03/27/2024)   Depression (PHQ2-9)    PHQ-2 Score: 0  Alcohol Screen: Low Risk (11/18/2023)   Alcohol Screen    Last Alcohol Screening Score (AUDIT): 0  Housing: Unknown (11/14/2024)   [facility]  Unable to Pay for Housing in the  Last Year: Not on file    Number of Times Moved in the Last Year: Not on file    At any time in the past 12 months, were you homeless or living in a shelter (including now)?: No  Utilities: Not At Risk (11/18/2023)   [facility] Utilities    Threatened with loss of utilities: No  Health Literacy: Adequate Health Literacy (11/18/2023)   B1300 Health Literacy    Frequency of need for help with medical instructions: Never      Review of Systems  All other systems reviewed and are negative.      Objective:   Physical Exam Vitals reviewed.  Constitutional:      Appearance: Normal appearance. She is well-developed and normal weight.  Cardiovascular:     Rate and Rhythm: Normal rate and regular rhythm.     Heart sounds: Normal heart sounds. No murmur heard.    No friction rub. No gallop.  Pulmonary:     Effort: Pulmonary effort is normal. No respiratory distress.     Breath sounds: Normal breath sounds. No stridor. No wheezing or rhonchi.  Abdominal:     General: Bowel sounds are normal. There is no distension.     Palpations: Abdomen is soft. There is no mass.     Tenderness: There is no abdominal tenderness. There is no guarding or rebound.     Hernia: No hernia is present.  Musculoskeletal:     Right lower leg: No edema.     Left lower leg: No edema.  Neurological:     General: No focal deficit present.     Mental Status: She is alert and oriented to person, place, and time. Mental status is at baseline.     Cranial Nerves: No cranial nerve deficit.  Psychiatric:        Mood and Affect: Mood normal.        Behavior: Behavior normal.        Thought Content: Thought content normal.        Judgment: Judgment normal.    No visits with results within 1 Month(s) from this visit.  Latest known visit with results is:  Office Visit on 08/10/2024  Component Date Value Ref Range Status   TSH 11/13/2024 3.530  0.450 - 4.500 uIU/mL Final   Free T4 11/13/2024 1.48  0.82 - 1.77 ng/dL Final    Glucose 87/91/7974 96  70 - 99 mg/dL Final   BUN 87/91/7974 24  8 - 27 mg/dL Final   Creatinine, Ser 11/13/2024 1.14 (H)  0.57 - 1.00 mg/dL Final   eGFR 87/91/7974 51 (L)  >59 mL/min/1.73 Final   BUN/Creatinine Ratio 11/13/2024 21  12 - 28 Final   Sodium 11/13/2024 141  134 - 144 mmol/L Final   Potassium 11/13/2024 4.8  3.5 - 5.2 mmol/L Final   Chloride 11/13/2024 104  96 - 106 mmol/L Final   CO2 11/13/2024 22  20 - 29 mmol/L Final   Calcium  11/13/2024 9.8  8.7 - 10.3 mg/dL Final   Total Protein 87/91/7974 6.5  6.0 - 8.5 g/dL Final   Albumin 87/91/7974 4.4  3.8 - 4.8 g/dL Final   Globulin, Total 11/13/2024 2.1  1.5 - 4.5 g/dL Final   Bilirubin Total 11/13/2024 0.5  0.0 - 1.2 mg/dL Final   Alkaline Phosphatase 11/13/2024 59  49 - 135 IU/L Final   AST 11/13/2024 15  0 - 40 IU/L Final   ALT 11/13/2024  18  0 - 32 IU/L Final   Vit D, 25-Hydroxy 11/13/2024 41.7  30.0 - 100.0 ng/mL Final   Comment: Vitamin D  deficiency has been defined by the Institute of Medicine and an Endocrine Society practice guideline as a level of serum 25-OH vitamin D  less than 20 ng/mL (1,2). The Endocrine Society went on to further define vitamin D  insufficiency as a level between 21 and 29 ng/mL (2). 1. IOM (Institute of Medicine). 2010. Dietary reference    intakes for calcium  and D. [place]: The    [employer]. 2. Holick MF, Binkley [place], Bischoff-Ferrari HA, et al.    Evaluation, treatment, and prevention of vitamin D     deficiency: an Endocrine Society clinical practice    guideline. JCEM. 2011 Jul; 96(7):1911-30.           Assessment & Plan:  Encounter for Medicare annual wellness exam  Primary hypertension - Plan: CBC with Differential/Platelet  Pure hypercholesterolemia All of her immunizations are up-to-date including flu, shingles, COVID, and RSV.  She has had these at her local pharmacy.  Colon cancer and breast cancer screening are up-to-date.  She defers the bone density  until after her hiatal hernia surgery.  Stress test has been normal.  Echocardiogram has been normal.  Await the results of her CT scan to evaluate her pulmonary nodule.  Cholesterol and blood pressure are outstanding.  Recommend switching to liquid levothyroxine  and immediate release Wellbutrin  100 mg 3 times daily so that she can crush or take a liquid when she is on the liquid diet after surgery.  She can then resume her normal medication after 3 weeks.

## 2024-11-27 ENCOUNTER — Ambulatory Visit: Payer: Self-pay | Admitting: Student

## 2024-12-26 ENCOUNTER — Other Ambulatory Visit: Payer: Self-pay | Admitting: Family Medicine

## 2024-12-26 DIAGNOSIS — F411 Generalized anxiety disorder: Secondary | ICD-10-CM

## 2024-12-26 NOTE — Telephone Encounter (Signed)
 Requested medication (s) are due for refill today: Yes  Requested medication (s) are on the active medication list: Yes  Last refill:  08/03/24  Future visit scheduled: No  Notes to clinic:  Not delegated.    Requested Prescriptions  Pending Prescriptions Disp Refills   LORazepam  (ATIVAN ) 0.5 MG tablet [Pharmacy Med Name: lorazepam  0.5 mg tablet] 30 tablet 0    Sig: TAKE 1/2 TABLET BY MOUTH EVERY 8 HOURS AS NEEDED FOR ANXIETY     Not Delegated - Psychiatry: Anxiolytics/Hypnotics 2 Failed - 12/26/2024  3:42 PM      Failed - This refill cannot be delegated      Failed - Urine Drug Screen completed in last 360 days      Passed - Patient is not pregnant      Passed - Valid encounter within last 6 months    Recent Outpatient Visits           9 months ago Situational depression   [place] [facility] Pickard, Butler DASEN, MD   10 months ago Shortness of breath   [place] [facility] Aletha Bene, MD   1 year ago Pure hypercholesterolemia   [place] [facility] Duanne Butler DASEN, MD   2 years ago Pure hypercholesterolemia   [place] [facility] Pickard, Butler DASEN, MD   2 years ago Pure hypercholesterolemia   [place] [facility] Pickard, Butler DASEN, MD

## 2025-01-04 ENCOUNTER — Other Ambulatory Visit: Payer: Self-pay

## 2025-01-04 ENCOUNTER — Telehealth: Payer: Self-pay | Admitting: Family Medicine

## 2025-01-04 DIAGNOSIS — E7800 Pure hypercholesterolemia, unspecified: Secondary | ICD-10-CM

## 2025-01-04 MED ORDER — ROSUVASTATIN CALCIUM 10 MG PO TABS: 10.0000 mg | ORAL_TABLET | Freq: Every day | ORAL | 1 refills | Status: AC

## 2025-01-04 NOTE — Telephone Encounter (Signed)
 Sent in medication

## 2025-01-04 NOTE — Telephone Encounter (Signed)
 Prescription Request  01/04/2025  LOV: 03/27/2024  What is the name of the medication or equipment?   rosuvastatin  (CRESTOR ) 10 MG tablet  **90 day script requested**  Have you contacted your pharmacy to request a refill? Yes   Which pharmacy would you like this sent to?  [facility] [place], [place] - [id number] [place] [address] Phone: [phone] Fax: [phone]    Patient notified that their request is being sent to the clinical staff for review and that they should receive a response within 2 business days.   Please advise pharmacist.

## 2025-01-08 NOTE — Progress Notes (Signed)
 Anesthesia Review:  PCP: Cardiologist :  PPM/ ICD: Device Orders: Rep Notified:  Chest x-ray : CT Chest- 11/27/24  EKG : 02/21/24  CT Ciors- 07/25/24  Echo : 06/13/24  Stress test: 06/30/24  Cardiac Cath :   Activity level:  Sleep Study/ CPAP : Fasting Blood Sugar :      / Checks Blood Sugar -- times a day:    Blood Thinner/ Instructions /Last Dose: ASA / Instructions/ Last Dose :

## 2025-01-09 ENCOUNTER — Other Ambulatory Visit: Payer: Self-pay

## 2025-01-09 ENCOUNTER — Encounter (HOSPITAL_COMMUNITY): Payer: Self-pay

## 2025-01-09 ENCOUNTER — Encounter (HOSPITAL_COMMUNITY)
Admission: RE | Admit: 2025-01-09 | Discharge: 2025-01-09 | Disposition: A | Source: Ambulatory Visit | Attending: Surgery

## 2025-01-09 VITALS — BP 173/88 | HR 48 | Temp 98.4°F | Resp 16 | Ht 65.0 in | Wt 188.0 lb

## 2025-01-09 DIAGNOSIS — I700 Atherosclerosis of aorta: Secondary | ICD-10-CM | POA: Insufficient documentation

## 2025-01-09 DIAGNOSIS — I2510 Atherosclerotic heart disease of native coronary artery without angina pectoris: Secondary | ICD-10-CM | POA: Insufficient documentation

## 2025-01-09 DIAGNOSIS — M2550 Pain in unspecified joint: Secondary | ICD-10-CM

## 2025-01-09 DIAGNOSIS — Z01818 Encounter for other preprocedural examination: Secondary | ICD-10-CM | POA: Insufficient documentation

## 2025-01-09 DIAGNOSIS — K449 Diaphragmatic hernia without obstruction or gangrene: Secondary | ICD-10-CM | POA: Insufficient documentation

## 2025-01-09 DIAGNOSIS — K219 Gastro-esophageal reflux disease without esophagitis: Secondary | ICD-10-CM | POA: Insufficient documentation

## 2025-01-09 DIAGNOSIS — I493 Ventricular premature depolarization: Secondary | ICD-10-CM | POA: Insufficient documentation

## 2025-01-09 DIAGNOSIS — E039 Hypothyroidism, unspecified: Secondary | ICD-10-CM | POA: Insufficient documentation

## 2025-01-09 HISTORY — DX: Pleurisy: R09.1

## 2025-01-09 LAB — BASIC METABOLIC PANEL WITH GFR
Anion gap: 11 (ref 5–15)
BUN: 20 mg/dL (ref 8–23)
CO2: 22 mmol/L (ref 22–32)
Calcium: 9.3 mg/dL (ref 8.9–10.3)
Chloride: 106 mmol/L (ref 98–111)
Creatinine, Ser: 1.21 mg/dL — ABNORMAL HIGH (ref 0.44–1.00)
GFR, Estimated: 47 mL/min — ABNORMAL LOW
Glucose, Bld: 101 mg/dL — ABNORMAL HIGH (ref 70–99)
Potassium: 4.4 mmol/L (ref 3.5–5.1)
Sodium: 138 mmol/L (ref 135–145)

## 2025-01-09 LAB — CBC
HCT: 45.1 % (ref 36.0–46.0)
Hemoglobin: 14.9 g/dL (ref 12.0–15.0)
MCH: 30.3 pg (ref 26.0–34.0)
MCHC: 33 g/dL (ref 30.0–36.0)
MCV: 91.7 fL (ref 80.0–100.0)
Platelets: 207 10*3/uL (ref 150–400)
RBC: 4.92 MIL/uL (ref 3.87–5.11)
RDW: 12.8 % (ref 11.5–15.5)
WBC: 5.1 10*3/uL (ref 4.0–10.5)
nRBC: 0 % (ref 0.0–0.2)

## 2025-01-09 NOTE — Anesthesia Preprocedure Evaluation (Signed)
"                                    Anesthesia Evaluation    Airway        Dental   Pulmonary           Cardiovascular hypertension,      Neuro/Psych    GI/Hepatic   Endo/Other    Renal/GU      Musculoskeletal   Abdominal   Peds  Hematology   Anesthesia Other Findings   Reproductive/Obstetrics                              Anesthesia Physical Anesthesia Plan  ASA:   Anesthesia Plan:    Post-op Pain Management:    Induction:   PONV Risk Score and Plan:   Airway Management Planned:   Additional Equipment:   Intra-op Plan:   Post-operative Plan:   Informed Consent:   Plan Discussed with:   Anesthesia Plan Comments: (See PAT note from 2/3)         Anesthesia Quick Evaluation  "

## 2025-01-09 NOTE — Progress Notes (Signed)
 " Case: [id number] Date/Time: 01/18/25 1336   Procedure: REPAIR, HERNIA, PARAESOPHAGEAL, ROBOT-ASSISTED - ROBOTIC REPAIR OF PARAESOPHAGEAL HIATAL HERNIA WITH FUNDOPLICATION   Anesthesia type: General   Pre-op diagnosis: PARAESOPHAGEAL HIATAL HERNIA   Location: [facility] ROOM 02 / [facility]   Surgeons: Sheldon Standing, MD       DISCUSSION: Debra [name] is a 74 yo female with PMH of GERD, hiatal hernia, hypothyroid, arthritis, PONV  Patient evaluated by [facility] on 05/08/24 due to DOE. Stress test and Echo ordered. Echo in July 2025 showed normal LVEF 60-65%, grade I DD, no significant valve disease. CCTA ordered showed no CAD but did show large hiatal hernia. Last seen in clinic on 08/31/24 by PA Strader. It was felt her symptoms were related to her hiatal hernia and she was referred back to GI.   Pt underwent EGD on 10/27/24. No complications noted.  Seen by PCP on 11/21/25 for annual physical. Stable at that visit.    VS: BP (!) 173/88   Pulse (!) 48   Temp 36.9 C (Oral)   Resp 16   Ht 5' 5 (1.651 m)   Wt 85.3 kg   SpO2 97%   BMI 31.28 kg/m   PROVIDERS: Duanne Butler DASEN, MD   LABS: Labs reviewed: Acceptable for surgery. (all labs ordered are listed, but only abnormal results are displayed)  Labs Reviewed  BASIC METABOLIC PANEL WITH GFR - Abnormal; Notable for the following components:      Result Value   Glucose, Bld 101 (*)    Creatinine, Ser 1.21 (*)    GFR, Estimated 47 (*)    All other components within normal limits  CBC     CT Chest 11/17/24:  IMPRESSION: Changes consistent with prior granulomatous disease.   Stable nodular changes particularly in the right middle lobe when compared with the prior exam. Given their stability, repeat CT in 14-20 months is considered optional for low risk patients but is recommended for high-risk patients. This recommendation follows the consensus statement: Guidelines for Management of Incidental Pulmonary Nodules Detected on  CT Images: From the Fleischner Society 2017; Radiology 2017; 284:228-243.   Large hiatal hernia.   Cholelithiasis.   Aortic Atherosclerosis (ICD10-I70.0).  EKG 02/21/24:  Sinus bradycardia   Coronary CTA 07/24/24:  IMPRESSION: 1.  Coronary calcium  score of 0.   2.  Normal coronary origin with right dominance.   3. Nonobstructive CAD, with noncalcified plaque in proximal LAD causing minimal (0-24%) stenosis   RECOMMENDATIONS: CAD-RADS 1: Minimal non-obstructive CAD (0-24%). Consider non-atherosclerotic causes of chest pain. Consider preventive therapy and risk factor modification.  Stress test 06/30/24:  Narrative & Impression     A pharmacological stress test was performed using IV Lexiscan  0.4mg  over 10 seconds performed without concurrent submaximal exercise. The patient reported dyspnea and flushing during the stress test. Symptoms began at minute 1 and ended at minute 4. Test was ordered as a treadmill but the patient did not feel comfortable walking on the treadmill, even in the pre-test phase. Switched to Lexiscan . Normal blood pressure and normal heart rate response noted during stress. Heart rate recovery was normal.   No ST deviation was noted. Arrhythmias during stress: occasional PVCs. Arrhythmias during recovery: PVCs. ECG was interpretable and without significant changes. The ECG was not diagnostic due to pharmacologic protocol.   LV perfusion is abnormal. There is no evidence of ischemia. There is no evidence of infarction. Defect 1: There is a small defect with moderate reduction in uptake present  in the apical apex location(s) that is fixed. There is normal wall motion in the defect area. Consistent with artifact.   Left ventricular function is normal. Nuclear stress EF: 71%. The left ventricular ejection fraction is hyperdynamic (>65%). End diastolic cavity size is normal. End systolic cavity size is normal. Evidence of transient ischemic dilation (TID) noted. TID  was appreciated quantitatively but not visually.   Findings are consistent with no ischemia. The study is intermediate risk due to increased TID which can be seen in  multivessel CAD.  Echo 06/12/24:  IMPRESSIONS    1. Left ventricular ejection fraction, by estimation, is 60 to 65%. The left ventricle has normal function. The left ventricle has no regional wall motion abnormalities. Left ventricular diastolic parameters are consistent with Grade I diastolic dysfunction (impaired relaxation). The average left ventricular global longitudinal strain is -19.4 %. The global longitudinal strain is normal.  2. Right ventricular systolic function is normal. The right ventricular size is normal.  3. The mitral valve is normal in structure. Trivial mitral valve regurgitation. No evidence of mitral stenosis.  4. The tricuspid valve is abnormal.  5. The aortic valve is tricuspid. Aortic valve regurgitation is not visualized. No aortic stenosis is present.  6. The inferior vena cava is normal in size with greater than 50% respiratory variability, suggesting right atrial pressure of 3 mmHg.  Comparison(s): No prior Echocardiogram.   Past Medical History:  Diagnosis Date   Diverticulitis    Diverticulosis of colon (without mention of hemorrhage)    Esophageal reflux    Hiatal hernia    Hypertension    Mixed hyperlipidemia    OA (osteoarthritis)    Pleurisy    as a child   PONV (postoperative nausea and vomiting)    Shortness of breath 02/21/2024   due to hernia   Unspecified hypothyroidism     Past Surgical History:  Procedure Laterality Date   ABDOMINAL HYSTERECTOMY  1998   APPENDECTOMY     BOWEL RESECTION  05/11/2012   Procedure: LOW ANTERIOR BOWEL RESECTION;  Surgeon: Oneil DELENA Budge, MD;  Location: [facility];  Service: General;;   CESAREAN SECTION     First one 20 , second on 1984   COLON RESECTION  05/11/2012   Procedure: HAND ASSISTED LAPAROSCOPIC COLON RESECTION;  Surgeon:  Oneil DELENA Budge, MD;  Location: [facility];  Service: General;  Laterality: N/A;  Laparoscopic Hand Assisted Partial Colectomy;   COLONOSCOPY  2011-2008   Dr. Budge   COLONOSCOPY  03/10/2012   Procedure: COLONOSCOPY;  Surgeon: Claudis RAYMOND Rivet, MD;  [facility];  Service: Endoscopy;  Laterality: N/A;  200   ESOPHAGOGASTRODUODENOSCOPY N/A 10/27/2024   Procedure: EGD (ESOPHAGOGASTRODUODENOSCOPY);  Surgeon: Eartha Flavors, Toribio, MD;  Location: [facility];  Service: Gastroenterology;  Laterality: N/A;  10:45am, ASA 1-2   TONSILLECTOMY      MEDICATIONS:  buPROPion  (WELLBUTRIN  XL) 300 MG 24 hr tablet   Cholecalciferol (VITAMIN D3) 50 MCG (2000 UT) CAPS   Coenzyme Q10 (COQ10 PO)   fluticasone  (FLONASE ) 50 MCG/ACT nasal spray   levothyroxine  (SYNTHROID ) 25 MCG tablet   LORazepam  (ATIVAN ) 0.5 MG tablet   pantoprazole  (PROTONIX ) 40 MG tablet   propranolol  ER (INDERAL  LA) 80 MG 24 hr capsule   rosuvastatin  (CRESTOR ) 10 MG tablet   triamcinolone  cream (KENALOG ) 0.1 %   No current facility-administered medications for this encounter.    Burnard CHRISTELLA Odis DEVONNA [facility] Short Stay/Anesthesiology [facility] Phone [phone] 01/09/2025 3:13 PM       "

## 2025-01-11 ENCOUNTER — Other Ambulatory Visit: Payer: Self-pay | Admitting: Family Medicine

## 2025-01-11 DIAGNOSIS — E7800 Pure hypercholesterolemia, unspecified: Secondary | ICD-10-CM

## 2025-01-18 ENCOUNTER — Ambulatory Visit (HOSPITAL_COMMUNITY): Admission: RE | Admit: 2025-01-18 | Source: Ambulatory Visit | Admitting: Surgery

## 2025-01-18 ENCOUNTER — Encounter (HOSPITAL_COMMUNITY): Admission: RE | Payer: Self-pay | Source: Ambulatory Visit

## 2025-01-18 ENCOUNTER — Encounter (HOSPITAL_COMMUNITY): Payer: Self-pay | Admitting: Medical

## 2025-01-18 SURGERY — REPAIR, HERNIA, PARAESOPHAGEAL, ROBOT-ASSISTED
Anesthesia: General

## 2025-05-21 ENCOUNTER — Ambulatory Visit: Admitting: "Endocrinology
